# Patient Record
Sex: Female | Born: 1940 | Race: White | Hispanic: No | Marital: Married | Smoking: Never smoker
Health system: Southern US, Community
[De-identification: ages and names within clinical notes are randomized; demographics above are authoritative.]

## PROBLEM LIST (undated history)

## (undated) DIAGNOSIS — G2581 Restless legs syndrome: Secondary | ICD-10-CM

## (undated) DIAGNOSIS — R42 Dizziness and giddiness: Secondary | ICD-10-CM

## (undated) DIAGNOSIS — E785 Hyperlipidemia, unspecified: Secondary | ICD-10-CM

## (undated) DIAGNOSIS — G459 Transient cerebral ischemic attack, unspecified: Secondary | ICD-10-CM

## (undated) DIAGNOSIS — R9082 White matter disease, unspecified: Secondary | ICD-10-CM

## (undated) DIAGNOSIS — G959 Disease of spinal cord, unspecified: Secondary | ICD-10-CM

## (undated) DIAGNOSIS — E119 Type 2 diabetes mellitus without complications: Secondary | ICD-10-CM

## (undated) DIAGNOSIS — R519 Headache, unspecified: Secondary | ICD-10-CM

## (undated) DIAGNOSIS — I10 Essential (primary) hypertension: Secondary | ICD-10-CM

## (undated) DIAGNOSIS — M1990 Unspecified osteoarthritis, unspecified site: Secondary | ICD-10-CM

## (undated) DIAGNOSIS — I639 Cerebral infarction, unspecified: Secondary | ICD-10-CM

## (undated) DIAGNOSIS — R51 Headache: Secondary | ICD-10-CM

## (undated) DIAGNOSIS — F419 Anxiety disorder, unspecified: Secondary | ICD-10-CM

## (undated) HISTORY — PX: COLONOSCOPY W/ POLYPECTOMY: SHX1380

---

## 2000-06-24 DIAGNOSIS — I639 Cerebral infarction, unspecified: Secondary | ICD-10-CM

## 2000-06-24 HISTORY — DX: Cerebral infarction, unspecified: I63.9

## 2004-04-13 ENCOUNTER — Ambulatory Visit: Payer: Self-pay | Admitting: Internal Medicine

## 2005-07-30 ENCOUNTER — Ambulatory Visit: Payer: Self-pay | Admitting: Internal Medicine

## 2006-03-03 ENCOUNTER — Ambulatory Visit: Payer: Self-pay | Admitting: Gastroenterology

## 2006-09-04 ENCOUNTER — Ambulatory Visit: Payer: Self-pay | Admitting: Internal Medicine

## 2007-09-23 ENCOUNTER — Ambulatory Visit: Payer: Self-pay | Admitting: Internal Medicine

## 2008-11-30 ENCOUNTER — Ambulatory Visit: Payer: Self-pay | Admitting: Internal Medicine

## 2010-03-20 ENCOUNTER — Ambulatory Visit: Payer: Self-pay | Admitting: Internal Medicine

## 2010-04-05 ENCOUNTER — Ambulatory Visit: Payer: Self-pay | Admitting: Internal Medicine

## 2010-06-24 HISTORY — PX: BACK SURGERY: SHX140

## 2010-07-02 ENCOUNTER — Ambulatory Visit: Payer: Self-pay | Admitting: Gastroenterology

## 2011-05-20 ENCOUNTER — Ambulatory Visit: Payer: Self-pay | Admitting: Internal Medicine

## 2012-05-20 ENCOUNTER — Ambulatory Visit: Payer: Self-pay | Admitting: Internal Medicine

## 2012-11-17 ENCOUNTER — Emergency Department: Payer: Self-pay | Admitting: Internal Medicine

## 2012-11-20 ENCOUNTER — Ambulatory Visit: Payer: Self-pay | Admitting: Internal Medicine

## 2012-11-23 ENCOUNTER — Other Ambulatory Visit: Payer: Self-pay | Admitting: Neurosurgery

## 2012-11-24 ENCOUNTER — Encounter (HOSPITAL_COMMUNITY): Payer: Self-pay | Admitting: Pharmacy Technician

## 2012-11-25 ENCOUNTER — Encounter (HOSPITAL_COMMUNITY): Payer: Self-pay | Admitting: Surgery

## 2012-11-25 MED ORDER — CEFAZOLIN SODIUM-DEXTROSE 2-3 GM-% IV SOLR
2.0000 g | INTRAVENOUS | Status: AC
  Administered 2012-11-26: 2 g via INTRAVENOUS
  Filled 2012-11-25 (×2): qty 50

## 2012-11-25 MED ORDER — MUPIROCIN 2 % EX OINT
TOPICAL_OINTMENT | Freq: Once | CUTANEOUS | Status: DC
  Filled 2012-11-25 (×2): qty 22

## 2012-11-26 ENCOUNTER — Inpatient Hospital Stay (HOSPITAL_COMMUNITY): Payer: Medicare Other

## 2012-11-26 ENCOUNTER — Encounter (HOSPITAL_COMMUNITY): Payer: Self-pay | Admitting: *Deleted

## 2012-11-26 ENCOUNTER — Inpatient Hospital Stay (HOSPITAL_COMMUNITY): Payer: Medicare Other | Admitting: *Deleted

## 2012-11-26 ENCOUNTER — Encounter (HOSPITAL_COMMUNITY)
Admission: RE | Disposition: A | Discharge status: Home / Self Care | Payer: Self-pay | Source: Ambulatory Visit | Attending: Neurosurgery

## 2012-11-26 ENCOUNTER — Inpatient Hospital Stay (HOSPITAL_COMMUNITY)
Admission: RE | Admit: 2012-11-26 | Discharge: 2012-11-27 | DRG: 491 | Disposition: A | Discharge status: Home / Self Care | Payer: Medicare Other | Source: Ambulatory Visit | Attending: Neurosurgery | Admitting: Neurosurgery

## 2012-11-26 DIAGNOSIS — Z8673 Personal history of transient ischemic attack (TIA), and cerebral infarction without residual deficits: Secondary | ICD-10-CM

## 2012-11-26 DIAGNOSIS — E119 Type 2 diabetes mellitus without complications: Secondary | ICD-10-CM | POA: Diagnosis present

## 2012-11-26 DIAGNOSIS — I10 Essential (primary) hypertension: Secondary | ICD-10-CM | POA: Diagnosis present

## 2012-11-26 DIAGNOSIS — Z79899 Other long term (current) drug therapy: Secondary | ICD-10-CM

## 2012-11-26 DIAGNOSIS — Z7982 Long term (current) use of aspirin: Secondary | ICD-10-CM

## 2012-11-26 DIAGNOSIS — M5126 Other intervertebral disc displacement, lumbar region: Principal | ICD-10-CM | POA: Diagnosis present

## 2012-11-26 DIAGNOSIS — M47817 Spondylosis without myelopathy or radiculopathy, lumbosacral region: Secondary | ICD-10-CM | POA: Diagnosis present

## 2012-11-26 DIAGNOSIS — E785 Hyperlipidemia, unspecified: Secondary | ICD-10-CM | POA: Diagnosis present

## 2012-11-26 DIAGNOSIS — M51379 Other intervertebral disc degeneration, lumbosacral region without mention of lumbar back pain or lower extremity pain: Secondary | ICD-10-CM | POA: Diagnosis present

## 2012-11-26 DIAGNOSIS — M5137 Other intervertebral disc degeneration, lumbosacral region: Secondary | ICD-10-CM | POA: Diagnosis present

## 2012-11-26 HISTORY — PX: LUMBAR LAMINECTOMY/DECOMPRESSION MICRODISCECTOMY: SHX5026

## 2012-11-26 HISTORY — DX: Transient cerebral ischemic attack, unspecified: G45.9

## 2012-11-26 HISTORY — DX: Hyperlipidemia, unspecified: E78.5

## 2012-11-26 HISTORY — DX: Dizziness and giddiness: R42

## 2012-11-26 HISTORY — DX: Type 2 diabetes mellitus without complications: E11.9

## 2012-11-26 HISTORY — DX: Essential (primary) hypertension: I10

## 2012-11-26 LAB — CBC
MCH: 30.6 pg (ref 26.0–34.0)
MCV: 87.4 fL (ref 78.0–100.0)
Platelets: 226 10*3/uL (ref 150–400)
RDW: 13 % (ref 11.5–15.5)

## 2012-11-26 LAB — BASIC METABOLIC PANEL
BUN: 11 mg/dL (ref 6–23)
CO2: 27 mEq/L (ref 19–32)
Calcium: 9.5 mg/dL (ref 8.4–10.5)
Chloride: 102 mEq/L (ref 96–112)
Creatinine, Ser: 0.58 mg/dL (ref 0.50–1.10)
GFR calc Af Amer: >90 mL/min (ref >90)
GFR calc non Af Amer: 89 mL/min — ABNORMAL LOW (ref >90)
Glucose, Bld: 185 mg/dL — ABNORMAL HIGH (ref 70–99)
Potassium: 3.6 mEq/L (ref 3.5–5.1)
Sodium: 138 mEq/L (ref 135–145)

## 2012-11-26 LAB — GLUCOSE, CAPILLARY
Glucose-Capillary: 164 mg/dL — ABNORMAL HIGH (ref 70–99)
Glucose-Capillary: 139 mg/dL — ABNORMAL HIGH (ref 70–99)
Glucose-Capillary: 185 mg/dL — ABNORMAL HIGH (ref 70–99)
Glucose-Capillary: 329 mg/dL — ABNORMAL HIGH (ref 70–99)

## 2012-11-26 SURGERY — LUMBAR LAMINECTOMY/DECOMPRESSION MICRODISCECTOMY 1 LEVEL
Anesthesia: General | Site: Back | Laterality: Right | Wound class: Clean

## 2012-11-26 MED ORDER — KETOROLAC TROMETHAMINE 30 MG/ML IJ SOLN
INTRAMUSCULAR | Status: AC
  Filled 2012-11-26: qty 1

## 2012-11-26 MED ORDER — ONDANSETRON 8 MG/NS 50 ML IVPB
8.0000 mg | Freq: Four times a day (QID) | INTRAVENOUS | Status: DC | PRN
Start: 2012-11-26 — End: 2012-11-27
  Filled 2012-11-26: qty 8

## 2012-11-26 MED ORDER — LIDOCAINE-EPINEPHRINE 1 %-1:100000 IJ SOLN
INTRAMUSCULAR | Status: DC | PRN
  Administered 2012-11-26: 10 mL

## 2012-11-26 MED ORDER — HEMOSTATIC AGENTS (NO CHARGE) OPTIME
TOPICAL | Status: DC | PRN
  Administered 2012-11-26: 1 via TOPICAL

## 2012-11-26 MED ORDER — BUPIVACAINE HCL (PF) 0.5 % IJ SOLN
INTRAMUSCULAR | Status: DC | PRN
  Administered 2012-11-26: 10 mL

## 2012-11-26 MED ORDER — 0.9 % SODIUM CHLORIDE (POUR BTL) OPTIME
TOPICAL | Status: DC | PRN
  Administered 2012-11-26: 1000 mL

## 2012-11-26 MED ORDER — KETOROLAC TROMETHAMINE 30 MG/ML IJ SOLN
30.0000 mg | Freq: Four times a day (QID) | INTRAMUSCULAR | Status: DC
  Administered 2012-11-26 – 2012-11-27 (×3): 30 mg via INTRAVENOUS
  Filled 2012-11-26 (×7): qty 1

## 2012-11-26 MED ORDER — ACETAMINOPHEN 10 MG/ML IV SOLN
INTRAVENOUS | Status: AC
  Administered 2012-11-26: 1000 mg via INTRAVENOUS
  Filled 2012-11-26: qty 100

## 2012-11-26 MED ORDER — BACITRACIN 50000 UNITS IM SOLR
INTRAMUSCULAR | Status: AC
  Filled 2012-11-26: qty 1

## 2012-11-26 MED ORDER — SODIUM CHLORIDE 0.9 % IJ SOLN: 3.0000 mL | Freq: Two times a day (BID) | INTRAMUSCULAR | Status: DC

## 2012-11-26 MED ORDER — HYDROCHLOROTHIAZIDE 12.5 MG PO CAPS
12.5000 mg | ORAL_CAPSULE | Freq: Every day | ORAL | Status: DC
  Filled 2012-11-26 (×2): qty 1

## 2012-11-26 MED ORDER — KETOROLAC TROMETHAMINE 30 MG/ML IJ SOLN
30.0000 mg | Freq: Once | INTRAMUSCULAR | Status: AC
  Administered 2012-11-26: 30 mg via INTRAVENOUS

## 2012-11-26 MED ORDER — EPHEDRINE SULFATE 50 MG/ML IJ SOLN
INTRAMUSCULAR | Status: DC | PRN
  Administered 2012-11-26: 10 mg via INTRAVENOUS

## 2012-11-26 MED ORDER — METHYLPREDNISOLONE ACETATE 80 MG/ML IJ SUSP
INTRAMUSCULAR | Status: DC | PRN
  Administered 2012-11-26: 80 mg

## 2012-11-26 MED ORDER — HYDROXYZINE HCL 50 MG PO TABS
50.0000 mg | ORAL_TABLET | ORAL | Status: DC | PRN
  Filled 2012-11-26: qty 1

## 2012-11-26 MED ORDER — SODIUM CHLORIDE 0.9 % IJ SOLN: 3.0000 mL | INTRAMUSCULAR | Status: DC | PRN

## 2012-11-26 MED ORDER — PROPOFOL 10 MG/ML IV BOLUS
INTRAVENOUS | Status: DC | PRN
  Administered 2012-11-26: 200 mg via INTRAVENOUS

## 2012-11-26 MED ORDER — GLIMEPIRIDE 4 MG PO TABS
4.0000 mg | ORAL_TABLET | Freq: Two times a day (BID) | ORAL | Status: DC
  Administered 2012-11-26 – 2012-11-27 (×2): 4 mg via ORAL
  Filled 2012-11-26 (×4): qty 1

## 2012-11-26 MED ORDER — ACETAMINOPHEN 650 MG RE SUPP: 650.0000 mg | RECTAL | Status: DC | PRN

## 2012-11-26 MED ORDER — HYDROMORPHONE HCL PF 1 MG/ML IJ SOLN
INTRAMUSCULAR | Status: AC
  Filled 2012-11-26: qty 1

## 2012-11-26 MED ORDER — THROMBIN 5000 UNITS EX SOLR
CUTANEOUS | Status: DC | PRN
  Administered 2012-11-26 (×2): 5000 [IU] via TOPICAL

## 2012-11-26 MED ORDER — OXYCODONE HCL 5 MG PO TABS
5.0000 mg | ORAL_TABLET | Freq: Once | ORAL | Status: AC | PRN
  Administered 2012-11-26: 5 mg via ORAL

## 2012-11-26 MED ORDER — PHENYLEPHRINE HCL 10 MG/ML IJ SOLN
INTRAMUSCULAR | Status: DC | PRN
  Administered 2012-11-26: 80 ug via INTRAVENOUS
  Administered 2012-11-26: 40 ug via INTRAVENOUS
  Administered 2012-11-26 (×3): 80 ug via INTRAVENOUS
  Administered 2012-11-26: 40 ug via INTRAVENOUS

## 2012-11-26 MED ORDER — LACTATED RINGERS IV SOLN
INTRAVENOUS | Status: DC | PRN
  Administered 2012-11-26 (×2): via INTRAVENOUS

## 2012-11-26 MED ORDER — ARTIFICIAL TEARS OP OINT
TOPICAL_OINTMENT | OPHTHALMIC | Status: DC | PRN
  Administered 2012-11-26: 1 via OPHTHALMIC

## 2012-11-26 MED ORDER — MIDAZOLAM HCL 5 MG/5ML IJ SOLN
INTRAMUSCULAR | Status: DC | PRN
  Administered 2012-11-26: 2 mg via INTRAVENOUS

## 2012-11-26 MED ORDER — LIDOCAINE HCL (CARDIAC) 20 MG/ML IV SOLN
INTRAVENOUS | Status: DC | PRN
  Administered 2012-11-26: 50 mg via INTRAVENOUS

## 2012-11-26 MED ORDER — SODIUM CHLORIDE 0.9 % IV SOLN
INTRAVENOUS | Status: AC
  Filled 2012-11-26: qty 500

## 2012-11-26 MED ORDER — MENTHOL 3 MG MT LOZG: 1.0000 | LOZENGE | OROMUCOSAL | Status: DC | PRN

## 2012-11-26 MED ORDER — ACETAMINOPHEN 10 MG/ML IV SOLN
1000.0000 mg | Freq: Four times a day (QID) | INTRAVENOUS | Status: DC
  Administered 2012-11-26 – 2012-11-27 (×3): 1000 mg via INTRAVENOUS
  Filled 2012-11-26 (×4): qty 100

## 2012-11-26 MED ORDER — NEOSTIGMINE METHYLSULFATE 1 MG/ML IJ SOLN
INTRAMUSCULAR | Status: DC | PRN
  Administered 2012-11-26: 1 mg via INTRAVENOUS
  Administered 2012-11-26: 2 mg via INTRAVENOUS

## 2012-11-26 MED ORDER — AMLODIPINE BESYLATE 10 MG PO TABS
10.0000 mg | ORAL_TABLET | Freq: Every day | ORAL | Status: DC
  Filled 2012-11-26 (×2): qty 1

## 2012-11-26 MED ORDER — CYCLOBENZAPRINE HCL 10 MG PO TABS: 10.0000 mg | ORAL_TABLET | Freq: Three times a day (TID) | ORAL | Status: DC | PRN

## 2012-11-26 MED ORDER — MUPIROCIN 2 % EX OINT
TOPICAL_OINTMENT | Freq: Two times a day (BID) | CUTANEOUS | Status: DC
  Administered 2012-11-26: 1 via NASAL

## 2012-11-26 MED ORDER — FENTANYL CITRATE 0.05 MG/ML IJ SOLN
INTRAMUSCULAR | Status: AC
  Filled 2012-11-26: qty 2

## 2012-11-26 MED ORDER — OXYCODONE HCL 5 MG PO TABS
ORAL_TABLET | ORAL | Status: AC
  Filled 2012-11-26: qty 1

## 2012-11-26 MED ORDER — ROCURONIUM BROMIDE 100 MG/10ML IV SOLN
INTRAVENOUS | Status: DC | PRN
  Administered 2012-11-26: 50 mg via INTRAVENOUS

## 2012-11-26 MED ORDER — OXYCODONE HCL 5 MG/5ML PO SOLN: 5.0000 mg | Freq: Once | ORAL | Status: AC | PRN

## 2012-11-26 MED ORDER — BISACODYL 10 MG RE SUPP: 10.0000 mg | Freq: Every day | RECTAL | Status: DC | PRN

## 2012-11-26 MED ORDER — ONDANSETRON HCL 4 MG/2ML IJ SOLN
INTRAMUSCULAR | Status: DC | PRN
  Administered 2012-11-26: 4 mg via INTRAVENOUS

## 2012-11-26 MED ORDER — FENTANYL CITRATE 0.05 MG/ML IJ SOLN
INTRAMUSCULAR | Status: DC | PRN
  Administered 2012-11-26: 50 ug via INTRAVENOUS
  Administered 2012-11-26: 100 ug via INTRAVENOUS
  Administered 2012-11-26 (×2): 50 ug via INTRAVENOUS

## 2012-11-26 MED ORDER — PHENOL 1.4 % MT LIQD: 1.0000 | OROMUCOSAL | Status: DC | PRN

## 2012-11-26 MED ORDER — GLYCOPYRROLATE 0.2 MG/ML IJ SOLN
INTRAMUSCULAR | Status: DC | PRN
  Administered 2012-11-26 (×2): 0.2 mg via INTRAVENOUS

## 2012-11-26 MED ORDER — ATORVASTATIN CALCIUM 80 MG PO TABS
80.0000 mg | ORAL_TABLET | Freq: Every day | ORAL | Status: DC
  Administered 2012-11-26: 80 mg via ORAL
  Filled 2012-11-26 (×2): qty 1

## 2012-11-26 MED ORDER — POTASSIUM CHLORIDE IN NACL 20-0.9 MEQ/L-% IV SOLN
INTRAVENOUS | Status: DC
  Administered 2012-11-26: 19:00:00 via INTRAVENOUS
  Filled 2012-11-26 (×4): qty 1000

## 2012-11-26 MED ORDER — LISINOPRIL 40 MG PO TABS
80.0000 mg | ORAL_TABLET | Freq: Every day | ORAL | Status: DC
  Filled 2012-11-26 (×2): qty 2

## 2012-11-26 MED ORDER — HYDROMORPHONE HCL PF 1 MG/ML IJ SOLN
0.2500 mg | INTRAMUSCULAR | Status: DC | PRN
Start: 2012-11-26 — End: 2012-11-26
  Administered 2012-11-26 (×2): 0.5 mg via INTRAVENOUS

## 2012-11-26 MED ORDER — MUPIROCIN 2 % EX OINT
TOPICAL_OINTMENT | Freq: Two times a day (BID) | CUTANEOUS | Status: DC
  Administered 2012-11-26: 22:00:00 via NASAL

## 2012-11-26 MED ORDER — ONDANSETRON HCL 4 MG/2ML IJ SOLN
4.0000 mg | Freq: Four times a day (QID) | INTRAMUSCULAR | Status: DC | PRN
Start: 2012-11-26 — End: 2012-11-26

## 2012-11-26 MED ORDER — ONDANSETRON HCL 4 MG/2ML IJ SOLN
4.0000 mg | Freq: Four times a day (QID) | INTRAMUSCULAR | Status: DC | PRN
Start: 2012-11-26 — End: 2012-11-27

## 2012-11-26 MED ORDER — SODIUM CHLORIDE 0.9 % IR SOLN
Status: DC | PRN
  Administered 2012-11-26: 15:00:00

## 2012-11-26 MED ORDER — MAGNESIUM HYDROXIDE 400 MG/5ML PO SUSP: 30.0000 mL | Freq: Every day | ORAL | Status: DC | PRN

## 2012-11-26 MED ORDER — OXYCODONE HCL 5 MG PO TABS: 5.0000 mg | ORAL_TABLET | ORAL | Status: DC | PRN

## 2012-11-26 MED ORDER — FENTANYL CITRATE 0.05 MG/ML IJ SOLN
INTRAMUSCULAR | Status: DC | PRN
  Administered 2012-11-26: 100 ug via INTRAVENOUS

## 2012-11-26 MED ORDER — ZOLPIDEM TARTRATE 5 MG PO TABS
5.0000 mg | ORAL_TABLET | Freq: Every evening | ORAL | Status: DC | PRN
  Administered 2012-11-27: 5 mg via ORAL
  Filled 2012-11-26: qty 1

## 2012-11-26 MED ORDER — METOCLOPRAMIDE HCL 5 MG/ML IJ SOLN: 10.0000 mg | Freq: Once | INTRAMUSCULAR | Status: DC | PRN

## 2012-11-26 MED ORDER — MORPHINE SULFATE 4 MG/ML IJ SOLN: 4.0000 mg | INTRAMUSCULAR | Status: DC | PRN

## 2012-11-26 MED ORDER — ACETAMINOPHEN 325 MG PO TABS: 650.0000 mg | ORAL_TABLET | ORAL | Status: DC | PRN

## 2012-11-26 MED ORDER — ALUM & MAG HYDROXIDE-SIMETH 200-200-20 MG/5ML PO SUSP: 30.0000 mL | Freq: Four times a day (QID) | ORAL | Status: DC | PRN

## 2012-11-26 SURGICAL SUPPLY — 56 items
BAG DECANTER FOR FLEXI CONT (MISCELLANEOUS) ×2 IMPLANT
BENZOIN TINCTURE PRP APPL 2/3 (GAUZE/BANDAGES/DRESSINGS) IMPLANT
BLADE SURG ROTATE 9660 (MISCELLANEOUS) IMPLANT
BRUSH SCRUB EZ PLAIN DRY (MISCELLANEOUS) ×2 IMPLANT
BUR ACORN 6.0 ACORN (BURR) ×2 IMPLANT
BUR ACRON 5.0MM COATED (BURR) IMPLANT
BUR MATCHSTICK NEURO 3.0 LAGG (BURR) ×2 IMPLANT
CANISTER SUCTION 2500CC (MISCELLANEOUS) ×2 IMPLANT
CLOTH BEACON ORANGE TIMEOUT ST (SAFETY) ×2 IMPLANT
CONT SPEC 4OZ CLIKSEAL STRL BL (MISCELLANEOUS) ×2 IMPLANT
DERMABOND ADHESIVE PROPEN (GAUZE/BANDAGES/DRESSINGS) ×1
DERMABOND ADVANCED (GAUZE/BANDAGES/DRESSINGS)
DERMABOND ADVANCED .7 DNX12 (GAUZE/BANDAGES/DRESSINGS) IMPLANT
DERMABOND ADVANCED .7 DNX6 (GAUZE/BANDAGES/DRESSINGS) ×1 IMPLANT
DRAPE LAPAROTOMY 100X72X124 (DRAPES) ×2 IMPLANT
DRAPE MICROSCOPE LEICA (MISCELLANEOUS) IMPLANT
DRAPE MICROSCOPE ZEISS OPMI (DRAPES) ×2 IMPLANT
DRAPE POUCH INSTRU U-SHP 10X18 (DRAPES) ×2 IMPLANT
DRSG EMULSION OIL 3X3 NADH (GAUZE/BANDAGES/DRESSINGS) IMPLANT
ELECT REM PT RETURN 9FT ADLT (ELECTROSURGICAL) ×2
ELECTRODE REM PT RTRN 9FT ADLT (ELECTROSURGICAL) ×1 IMPLANT
GAUZE SPONGE 4X4 16PLY XRAY LF (GAUZE/BANDAGES/DRESSINGS) IMPLANT
GLOVE BIOGEL PI IND STRL 8 (GLOVE) ×2 IMPLANT
GLOVE BIOGEL PI INDICATOR 8 (GLOVE) ×2
GLOVE ECLIPSE 7.5 STRL STRAW (GLOVE) ×8 IMPLANT
GLOVE EXAM NITRILE LRG STRL (GLOVE) IMPLANT
GLOVE EXAM NITRILE MD LF STRL (GLOVE) ×2 IMPLANT
GLOVE EXAM NITRILE XL STR (GLOVE) IMPLANT
GLOVE EXAM NITRILE XS STR PU (GLOVE) IMPLANT
GOWN BRE IMP SLV AUR LG STRL (GOWN DISPOSABLE) IMPLANT
GOWN BRE IMP SLV AUR XL STRL (GOWN DISPOSABLE) ×2 IMPLANT
GOWN STRL REIN 2XL LVL4 (GOWN DISPOSABLE) ×2 IMPLANT
KIT BASIN OR (CUSTOM PROCEDURE TRAY) ×2 IMPLANT
KIT ROOM TURNOVER OR (KITS) ×2 IMPLANT
NEEDLE HYPO 18GX1.5 BLUNT FILL (NEEDLE) IMPLANT
NEEDLE SPNL 18GX3.5 QUINCKE PK (NEEDLE) ×2 IMPLANT
NEEDLE SPNL 22GX3.5 QUINCKE BK (NEEDLE) ×2 IMPLANT
NS IRRIG 1000ML POUR BTL (IV SOLUTION) ×2 IMPLANT
PACK LAMINECTOMY NEURO (CUSTOM PROCEDURE TRAY) ×2 IMPLANT
PAD ARMBOARD 7.5X6 YLW CONV (MISCELLANEOUS) ×6 IMPLANT
PATTIES SURGICAL .5 X1 (DISPOSABLE) IMPLANT
RUBBERBAND STERILE (MISCELLANEOUS) ×4 IMPLANT
SPONGE GAUZE 4X4 12PLY (GAUZE/BANDAGES/DRESSINGS) IMPLANT
SPONGE LAP 4X18 X RAY DECT (DISPOSABLE) IMPLANT
SPONGE SURGIFOAM ABS GEL SZ50 (HEMOSTASIS) ×2 IMPLANT
STRIP CLOSURE SKIN 1/2X4 (GAUZE/BANDAGES/DRESSINGS) IMPLANT
SUT PROLENE 6 0 BV (SUTURE) IMPLANT
SUT VIC AB 1 CT1 18XBRD ANBCTR (SUTURE) ×1 IMPLANT
SUT VIC AB 1 CT1 8-18 (SUTURE) ×1
SUT VIC AB 2-0 CP2 18 (SUTURE) ×2 IMPLANT
SUT VIC AB 3-0 SH 8-18 (SUTURE) ×2 IMPLANT
SYR 20ML ECCENTRIC (SYRINGE) ×2 IMPLANT
SYR 5ML LL (SYRINGE) IMPLANT
TOWEL OR 17X24 6PK STRL BLUE (TOWEL DISPOSABLE) ×2 IMPLANT
TOWEL OR 17X26 10 PK STRL BLUE (TOWEL DISPOSABLE) ×2 IMPLANT
WATER STERILE IRR 1000ML POUR (IV SOLUTION) ×2 IMPLANT

## 2012-11-26 NOTE — Anesthesia Postprocedure Evaluation (Signed)
Anesthesia Post Note  Patient: Cheryl Mueller  Procedure(s) Performed: Procedure(s) (LRB): LUMBAR LAMINECTOMY/DECOMPRESSION MICRODISCECTOMY 1 LEVEL (Right)  Anesthesia type: General  Patient location: PACU  Post pain: Pain level controlled  Post assessment: Patient's Cardiovascular Status Stable  Last Vitals:  Filed Vitals:   11/26/12 1644  BP: 138/65  Pulse: 64  Temp: 36.4 C  Resp: 18    Post vital signs: Reviewed and stable  Level of consciousness: alert  Complications: No apparent anesthesia complications

## 2012-11-26 NOTE — Plan of Care (Signed)
Problem: Consults Goal: Diagnosis - Spinal Surgery Outcome: Completed/Met Date Met:  11/26/12 Microdiscectomy

## 2012-11-26 NOTE — Anesthesia Preprocedure Evaluation (Signed)
Anesthesia Evaluation  Patient identified by MRN, date of birth, ID band Patient awake    Reviewed: Allergy & Precautions, H&P , NPO status , Patient's Chart, lab work & pertinent test results, reviewed documented beta blocker date and time   Airway Mallampati: II TM Distance: >3 FB Neck ROM: full    Dental   Pulmonary neg pulmonary ROS,  breath sounds clear to auscultation        Cardiovascular hypertension, Pt. on medications Rhythm:regular     Neuro/Psych TIAnegative neurological ROS  negative psych ROS   GI/Hepatic negative GI ROS, Neg liver ROS,   Endo/Other  diabetes, Oral Hypoglycemic Agents  Renal/GU negative Renal ROS  negative genitourinary   Musculoskeletal   Abdominal   Peds  Hematology negative hematology ROS (+)   Anesthesia Other Findings See surgeon's H&P   Reproductive/Obstetrics negative OB ROS                           Anesthesia Physical Anesthesia Plan  ASA: III  Anesthesia Plan: General   Post-op Pain Management:    Induction: Intravenous  Airway Management Planned: Oral ETT  Additional Equipment:   Intra-op Plan:   Post-operative Plan: Extubation in OR  Informed Consent: I have reviewed the patients History and Physical, chart, labs and discussed the procedure including the risks, benefits and alternatives for the proposed anesthesia with the patient or authorized representative who has indicated his/her understanding and acceptance.   Dental Advisory Given  Plan Discussed with: CRNA and Surgeon  Anesthesia Plan Comments:         Anesthesia Quick Evaluation

## 2012-11-26 NOTE — Anesthesia Procedure Notes (Signed)
Procedure Name: Intubation Date/Time: 11/26/2012 1:39 PM Performed by: Everlene Balls TODD Pre-anesthesia Checklist: Patient identified, Emergency Drugs available, Suction available, Patient being monitored and Timeout performed Patient Re-evaluated:Patient Re-evaluated prior to inductionOxygen Delivery Method: Circle system utilized Preoxygenation: Pre-oxygenation with 100% oxygen Intubation Type: IV induction Ventilation: Mask ventilation without difficulty Laryngoscope Size: Mac and 3 Grade View: Grade II Tube size: 7.5 mm Number of attempts: 1 Airway Equipment and Method: Stylet Placement Confirmation: ETT inserted through vocal cords under direct vision,  positive ETCO2 and breath sounds checked- equal and bilateral Secured at: 22 cm Tube secured with: Tape Dental Injury: Teeth and Oropharynx as per pre-operative assessment

## 2012-11-26 NOTE — Preoperative (Signed)
Beta Blockers   Reason not to administer Beta Blockers:Not Applicable 

## 2012-11-26 NOTE — Op Note (Signed)
11/26/2012  2:53 PM  PATIENT:  Cheryl Mueller  72 y.o. female  PRE-OPERATIVE DIAGNOSIS: Right L5-S1 lumbar disc herniation, lumbar degenerative disc disease, lumbar spondylosis, lumbar radiculopathy  POST-OPERATIVE DIAGNOSIS:  Right L5-S1 lumbar disc herniation, lumbar degenerative disc disease, lumbar spondylosis, lumbar radiculopathy  PROCEDURE:  Procedure(s): LUMBAR LAMINECTOMY/DECOMPRESSION MICRODISCECTOMY 1 LEVEL: Right L5-S1 lumbar laminotomy and microdiscectomy, with microdissection, microsurgical technique, and the operating microscope.  SURGEON:  Surgeon(s): Hewitt Shorts, MD  ASSISTANTS: Clydene Fake, M.D.  ANESTHESIA:   general  EBL:  Total I/O In: 1000 [I.V.:1000] Out: -   BLOOD ADMINISTERED:none  COUNT: correct per nursing staff  DICTATION: Patient was brought to the operating room and placed under general endotracheal anesthesia. Patient was turned to prone position the lumbar region was prepped with Betadine soap and solution and draped in a sterile fashion. The midline was infiltrated with local anesthetic with epinephrine. A localizing x-ray was taken and the L5-S1 level was identified. Midline incision was made over the L5-S1 level and was carried down through the subcutaneous tissue to the lumbar fascia. The lumbar fascia was incised on the right side and the paraspinal muscles were dissected from the spinous processes and lamina in a subperiosteal fashion. Another x-ray was taken and the L5-S1 intralaminar space was identified. The operating microscope was draped and brought into the field provided additional magnification, illumination, and visualization. Laminotomy was performed using the high-speed drill and Kerrison punches. The ligamentum flavum was carefully resected. The underlying thecal sac and nerve root were identified. The disc herniation was identified and the thecal sac and nerve root gently retracted medially. The disc herniation with a free  fragment that had migrated caudally behind the body of S1. He was removed in piecemeal fashion using the micropituitary rongeur. All loose fragments of disc until removed from within the disc space, and good decompression the thecal sac and nerve root was achieved. It is not felt that entering the disc space with enhanced decompression. Once the discectomy was completed and good decompression of the thecal sac and nerve had been achieved hemostasis was established with the use of bipolar cautery and Gelfoam with thrombin. The Gelfoam was removed and hemostasis confirmed. We then instilled 2 cc of fentanyl and 80 mg of Depo-Medrol into the epidural space. Deep fascia was closed with interrupted undyed 1 Vicryl sutures. Scarpa's fascia was closed with interrupted undyed 1 Vicryl sutures in the subcutaneous and subcuticular layer were closed with interrupted inverted 2-0 undyed Vicryl sutures. The skin edges were approximated with Dermabond. Following surgery the patient was turned back to a supine position to be reversed from the anesthetic extubated and transferred to the recovery room for further care.   PLAN OF CARE: Admit to inpatient   PATIENT DISPOSITION:  PACU - hemodynamically stable.   Delay start of Pharmacological VTE agent (>24hrs) due to surgical blood loss or risk of bleeding:  yes

## 2012-11-26 NOTE — Progress Notes (Signed)
Filed Vitals:   11/26/12 1551 11/26/12 1609 11/26/12 1618 11/26/12 1644  BP: 121/59 100/84  138/65  Pulse: 70 72  64  Temp:   98 F (36.7 C) 97.6 F (36.4 C)  TempSrc:      Resp: 12 16  18   Height:      Weight:      SpO2: 98% 99%  94%    CBC  Recent Labs  11/26/12 1047  WBC 8.1  HGB 13.3  HCT 38.0  PLT 226   BMET  Recent Labs  11/26/12 1047  NA 138  K 3.6  CL 102  CO2 27  GLUCOSE 185*  BUN 11  CREATININE 0.58  CALCIUM 9.5    Excellent relief of radicular pain. No significant incisional discomfort. Has been up and out of bed to the bathroom, and has voided. Wound clean and dry.Moving all 4 extremities well.  Plan: Encouraged to ambulate in the halls. Continued to progress to postoperative recovery.   Hewitt Shorts, MD 11/26/2012, 7:56 PM

## 2012-11-26 NOTE — Transfer of Care (Signed)
Immediate Anesthesia Transfer of Care Note  Patient: Cheryl Mueller  Procedure(s) Performed: Procedure(s) with comments: LUMBAR LAMINECTOMY/DECOMPRESSION MICRODISCECTOMY 1 LEVEL (Right) - Lumbar five-sacral one laminotomy and microdiskectomy   Patient Location: PACU  Anesthesia Type:General  Level of Consciousness: patient cooperative, lethargic and responds to stimulation  Airway & Oxygen Therapy: Patient Spontanous Breathing and Patient connected to nasal cannula oxygen  Post-op Assessment: Report given to PACU RN and Post -op Vital signs reviewed and stable  Post vital signs: Reviewed and stable  Complications: No apparent anesthesia complications

## 2012-11-26 NOTE — H&P (Signed)
Subjective:  Patient is a 72 y.o. female who is admitted for treatment of right lumbar radiculopathy secondary to right L5-S1 lumbar disc herniation. Patient's been in disabling pain to the right lower extremity. MRI scan reveals a right L5-S1 lumbar disc the fragment has migrated caudally behind the body of S1. Patient admitted now for a right L5-S1 lumbar laminotomy and discectomy.    Past Medical History  Diagnosis Date  . Hypertension   . Diabetes mellitus without complication   . TIA (transient ischemic attack)     approx 15 years ago  . Vertigo     hx of  . Hyperlipemia     Past Surgical History  Procedure Laterality Date  . Colonoscopy w/ polypectomy      Prescriptions prior to admission  Medication Sig Dispense Refill  . amLODipine (NORVASC) 10 MG tablet Take 10 mg by mouth daily.      Marland Kitchen aspirin EC 81 MG tablet Take 81 mg by mouth daily.      Marland Kitchen CALCIUM PO Take 1 tablet by mouth 2 (two) times daily.      Marland Kitchen glimepiride (AMARYL) 4 MG tablet Take 4 mg by mouth 2 (two) times daily.      . hydrochlorothiazide (MICROZIDE) 12.5 MG capsule Take 12.5 mg by mouth daily.      Marland Kitchen HYDROcodone-acetaminophen (NORCO/VICODIN) 5-325 MG per tablet Take 1 tablet by mouth every 8 (eight) hours as needed for pain.      Marland Kitchen lisinopril (PRINIVIL,ZESTRIL) 40 MG tablet Take 80 mg by mouth at bedtime.      . rosuvastatin (CRESTOR) 40 MG tablet Take 40 mg by mouth daily.       No Known Allergies  History  Substance Use Topics  . Smoking status: Never Smoker   . Smokeless tobacco: Not on file  . Alcohol Use: No    History reviewed. No pertinent family history.   Review of Systems A comprehensive review of systems was negative.  Objective: Vital signs in last 24 hours: Temp:  [98.5 F (36.9 C)] 98.5 F (36.9 C) (06/05 1043) Pulse Rate:  [74] 74 (06/05 1043) Resp:  [18] 18 (06/05 1043) BP: (148)/(67) 148/67 mmHg (06/05 1043) SpO2:  [98 %] 98 % (06/05 1043) Weight:  [67.841 kg (149 lb 9 oz)]  67.841 kg (149 lb 9 oz) (06/05 1043)  EXAM: Patient well-developed well-nourished white female in no acute distress. Lungs are clear to auscultation , the patient has symmetrical respiratory excursion. Heart has a regular rate and rhythm normal S1 and S2 no murmur.   Abdomen is soft nontender nondistended bowel sounds are present. Extremity examination shows no clubbing cyanosis or edema. Musculoskeletal examination shows a negative admit the left, but positive straight leg raising on the right at 70. Motor examination shows 5 over 5 strength in the lower extremities including the iliopsoas quadriceps dorsiflexor extensor hallicus  longus and plantar flexor bilaterally. Sensation is intact to pinprick in the distal lower extremities. Reflexes are symmetrical bilaterally. No pathologic reflexes are present. Patient has a normal gait and stance.   Data Review:CBC    Component Value Date/Time   WBC 8.1 11/26/2012 1047   RBC 4.35 11/26/2012 1047   HGB 13.3 11/26/2012 1047   HCT 38.0 11/26/2012 1047   PLT 226 11/26/2012 1047   MCV 87.4 11/26/2012 1047   MCH 30.6 11/26/2012 1047   MCHC 35.0 11/26/2012 1047   RDW 13.0 11/26/2012 1047  BMET    Component Value Date/Time   NA 138 11/26/2012 1047   K 3.6 11/26/2012 1047   CL 102 11/26/2012 1047   CO2 27 11/26/2012 1047   GLUCOSE 185* 11/26/2012 1047   BUN 11 11/26/2012 1047   CREATININE 0.58 11/26/2012 1047   CALCIUM 9.5 11/26/2012 1047   GFRNONAA 89* 11/26/2012 1047   GFRAA >90 11/26/2012 1047     Assessment/Plan: Patient with right lumbar radicular pain secondary to a right L5-S1 lumbar dysfunction, admitted for a right L5-S1 lumbar discectomy.  I've discussed with the patient the nature of his condition, the nature the surgical procedure, the typical length of surgery, hospital stay, and overall recuperation. We discussed limitations postoperatively. I discussed risks of surgery including risks of infection, bleeding, possibly need for transfusion,  the risk of nerve root dysfunction with pain, weakness, numbness, or paresthesias, or risk of dural tear and CSF leakage and possible need for further surgery, the risk of recurrent disc herniation and the possible need for further surgery, and the risk of anesthetic complications including myocardial infarction, stroke, pneumonia, and death. Understanding all this the patient does wish to proceed with surgery and is admitted for such.    Hewitt Shorts, MD 11/26/2012 1:04 PM

## 2012-11-27 ENCOUNTER — Encounter (HOSPITAL_COMMUNITY): Payer: Self-pay | Admitting: Neurosurgery

## 2012-11-27 LAB — GLUCOSE, CAPILLARY: Glucose-Capillary: 225 mg/dL — ABNORMAL HIGH (ref 70–99)

## 2012-11-27 NOTE — Progress Notes (Signed)
Utilization review completed. Shontelle Muska, RN, BSN. 

## 2012-11-27 NOTE — Discharge Summary (Signed)
Physician Discharge Summary  Patient ID: Cheryl Mueller MRN: [id number] DOB/AGE: 12/30/1940 72 y.o.  Admit date: 11/26/2012 Discharge date: 11/27/2012  Admission Diagnoses:  Right L5-S1 lumbar disc herniation, lumbar degenerative disc disease, lumbar spondylosis, lumbar radiculopathy  Discharge Diagnoses:  Right L5-S1 lumbar disc herniation, lumbar degenerative disc disease, lumbar spondylosis, lumbar radiculopathy  Discharged Condition: good  Hospital Course: Patient was admitted, underwent a right L5-S1 lumbar laminotomy and microdiscectomy. Possibly she is an excellent relief of her right lumbar radicular pain. She is up and living actively in the halls. She is asked to be discharged to home. We've given instructions regarding wound care and activities. She is to return for followup with me in 3 weeks.  Discharge Exam: Blood pressure 165/65, pulse 81, temperature 98.9 F (37.2 C), temperature source Oral, resp. rate 18, height 4' 11.5" (1.511 m), weight 67.841 kg (149 lb 9 oz), SpO2 95.00%.  Disposition:  home      Medication List    TAKE these medications       amLODipine 10 MG tablet  Commonly known as:  NORVASC  Take 10 mg by mouth daily.     aspirin EC 81 MG tablet  Take 81 mg by mouth daily.     CALCIUM PO  Take 1 tablet by mouth 2 (two) times daily.     glimepiride 4 MG tablet  Commonly known as:  AMARYL  Take 4 mg by mouth 2 (two) times daily.     hydrochlorothiazide 12.5 MG capsule  Commonly known as:  MICROZIDE  Take 12.5 mg by mouth daily.     HYDROcodone-acetaminophen 5-325 MG per tablet  Commonly known as:  NORCO/VICODIN  Take 1 tablet by mouth every 8 (eight) hours as needed for pain.     lisinopril 40 MG tablet  Commonly known as:  PRINIVIL,ZESTRIL  Take 80 mg by mouth at bedtime.     rosuvastatin 40 MG tablet  Commonly known as:  CRESTOR  Take 40 mg by mouth daily.         Signed: Hewitt Shorts, MD 11/27/2012, 7:30 AM  At 36 on a  prescription

## 2012-11-27 NOTE — Progress Notes (Signed)
Pt. discharged home accompanied by husband. Prescriptions and discharge instructions given with verbalization of understanding. Incision site on back with no s/s of infection - no swelling, redness, bleeding, and/or drainage noted. Soft collar intact. Opportunity given to ask questions but no question asked. Pt. transported out of this unit in wheelchair by the volunteer.

## 2013-05-24 ENCOUNTER — Ambulatory Visit: Payer: Self-pay | Admitting: Internal Medicine

## 2013-12-14 DIAGNOSIS — E1129 Type 2 diabetes mellitus with other diabetic kidney complication: Secondary | ICD-10-CM | POA: Insufficient documentation

## 2014-06-20 DIAGNOSIS — R809 Proteinuria, unspecified: Secondary | ICD-10-CM | POA: Insufficient documentation

## 2014-07-08 ENCOUNTER — Ambulatory Visit: Payer: Self-pay | Admitting: Internal Medicine

## 2014-08-11 DIAGNOSIS — N8111 Cystocele, midline: Secondary | ICD-10-CM | POA: Insufficient documentation

## 2014-09-15 ENCOUNTER — Encounter: Payer: Self-pay | Admitting: General Surgery

## 2014-09-15 ENCOUNTER — Ambulatory Visit (INDEPENDENT_AMBULATORY_CARE_PROVIDER_SITE_OTHER): Payer: Medicare Other | Admitting: General Surgery

## 2014-09-15 VITALS — BP 130/70 | HR 88 | Temp 98.2°F | Resp 14 | Ht 59.0 in | Wt 152.0 lb

## 2014-09-15 DIAGNOSIS — L02213 Cutaneous abscess of chest wall: Secondary | ICD-10-CM | POA: Diagnosis not present

## 2014-09-15 NOTE — Patient Instructions (Addendum)
Remove bandage and shower tomorrow then place a gauze dressing over area. Return in one week.

## 2014-09-15 NOTE — Progress Notes (Signed)
Patient ID: Cheryl Mueller, female   DOB: 04-26-1941, 74 y.o.   MRN: [id number]  Chief Complaint  Patient presents with  . Other    chest wall abscess    HPI Cheryl Mueller is a 74 y.o. female.  She comes in for evaluation of a painful knot under her left breast. She noticed it about 4-5 days ago. It has gotten worse, hard and red. Minimal drainage.     She states that she had one similar to that on her left buttock that drained about 2-3 weeks ago. She used heat to the area and it seemed to get better but there is still knot.  Dr Graciela HusbandsKlein called in her an antibiotic but she has not started it as of yet.   HPI  Past Medical History  Diagnosis Date  . Hypertension   . Diabetes mellitus without complication   . TIA (transient ischemic attack)     approx 15 years ago  . Vertigo     hx of  . Hyperlipemia     Past Surgical History  Procedure Laterality Date  . Colonoscopy w/ polypectomy    . Lumbar laminectomy/decompression microdiscectomy Right 11/26/2012    Procedure: LUMBAR LAMINECTOMY/DECOMPRESSION MICRODISCECTOMY 1 LEVEL;  Surgeon: Hewitt Shortsobert W Nudelman, MD;  Location: [facility];  Service: Neurosurgery;  Laterality: Right;  Lumbar five-sacral one laminotomy and microdiskectomy     Family History  Problem Relation Age of Onset  . Stroke Mother     Social History History  Substance Use Topics  . Smoking status: Never Smoker   . Smokeless tobacco: Never Used  . Alcohol Use: No    Allergies  Allergen Reactions  . Atorvastatin Rash    Current Outpatient Prescriptions  Medication Sig Dispense Refill  . amLODipine (NORVASC) 10 MG tablet Take 10 mg by mouth daily.    Marland Kitchen. aspirin EC 81 MG tablet Take 81 mg by mouth daily.    Marland Kitchen. CALCIUM PO Take 1 tablet by mouth 2 (two) times daily.    Marland Kitchen. glimepiride (AMARYL) 4 MG tablet Take 4 mg by mouth 2 (two) times daily.    . hydrochlorothiazide (MICROZIDE) 12.5 MG capsule Take 12.5 mg by mouth daily.    Marland Kitchen. HYDROcodone-acetaminophen  (NORCO/VICODIN) 5-325 MG per tablet Take 1 tablet by mouth every 8 (eight) hours as needed for pain.    Marland Kitchen. lisinopril (PRINIVIL,ZESTRIL) 40 MG tablet Take 80 mg by mouth at bedtime.    . rosuvastatin (CRESTOR) 40 MG tablet Take 40 mg by mouth daily.     No current facility-administered medications for this visit.    Review of Systems Review of Systems  Constitutional: Negative.   Respiratory: Negative.   Cardiovascular: Negative.     Blood pressure 130/70, pulse 88, temperature 98.2 F (36.8 C), temperature source Oral, resp. rate 14, height 4\' 11"  (1.499 m), weight 152 lb (68.947 kg).  Physical Exam Physical Exam  Constitutional: She is oriented to person, place, and time. She appears well-developed and well-nourished.  Pulmonary/Chest:    Genitourinary:     Neurological: She is alert and oriented to person, place, and time.  Skin: Skin is warm and dry.  3 x 5 cm abscessed area left chest wall mass   Data review PCP notes dated 09/15/2014.  Assessment    Cutaneous abscess.    Plan    Procedure for incision and drainage to accelerate resolution was reviewed with the patient. 20 mL of 0.5% Xylocaine with 0.25% Marcaine with 1-200,000 units  of epinephrine was instilled and supplemented with 3 mL of 1% plain Xylocaine. ChloraPrep was applied to the skin. A 5 x 20 mm ellipse of skin was excised 5 drainage. Loculations were broken up with a hemostat. Approximately 10 mL of thick purulent material consistent with staph was obtained. Culture was sent for routine aerobic organisms. Telfa wick and a bulky dressing was applied.  Wound care was reviewed with the patient. Supplies were provided.  A prescription for Norco 5/325, #30 with the inscription 1-2 by mouth every 4 hours when necessary for pain was provided.  Arrangements will be made for wound evaluation in 6 days.     PCP:  Grayland Ormond 09/16/2014, 6:05 AM

## 2014-09-16 DIAGNOSIS — L02213 Cutaneous abscess of chest wall: Secondary | ICD-10-CM | POA: Insufficient documentation

## 2014-09-19 LAB — ANAEROBIC AND AEROBIC CULTURE

## 2014-09-21 ENCOUNTER — Encounter: Payer: Self-pay | Admitting: General Surgery

## 2014-09-21 ENCOUNTER — Ambulatory Visit (INDEPENDENT_AMBULATORY_CARE_PROVIDER_SITE_OTHER): Payer: Medicare Other | Admitting: General Surgery

## 2014-09-21 VITALS — BP 130/74 | HR 70 | Resp 14 | Ht 59.0 in | Wt 149.0 lb

## 2014-09-21 DIAGNOSIS — L02213 Cutaneous abscess of chest wall: Secondary | ICD-10-CM

## 2014-09-21 NOTE — Patient Instructions (Signed)
Patient to use heat as needed. Return in three weeks.

## 2014-09-21 NOTE — Progress Notes (Signed)
Patient ID: Cheryl Mueller, female   DOB: 30-Jan-1941, 74 y.o.   MRN: [id number]  Chief Complaint  Patient presents with  . Routine Post Op    chest wall excision    HPI Cheryl Mueller is a 74 y.o. female here today for her post op chest wall excision done on 09/15/14. She states the area is draining a little.  HPI  Past Medical History  Diagnosis Date  . Hypertension   . Diabetes mellitus without complication   . TIA (transient ischemic attack)     approx 15 years ago  . Vertigo     hx of  . Hyperlipemia     Past Surgical History  Procedure Laterality Date  . Colonoscopy w/ polypectomy    . Lumbar laminectomy/decompression microdiscectomy Right 11/26/2012    Procedure: LUMBAR LAMINECTOMY/DECOMPRESSION MICRODISCECTOMY 1 LEVEL;  Surgeon: Hewitt Shortsobert W Nudelman, MD;  Location: [facility];  Service: Neurosurgery;  Laterality: Right;  Lumbar five-sacral one laminotomy and microdiskectomy     Family History  Problem Relation Age of Onset  . Stroke Mother     Social History History  Substance Use Topics  . Smoking status: Never Smoker   . Smokeless tobacco: Never Used  . Alcohol Use: No    Allergies  Allergen Reactions  . Atorvastatin Rash    Current Outpatient Prescriptions  Medication Sig Dispense Refill  . amLODipine (NORVASC) 10 MG tablet Take 10 mg by mouth daily.    Marland Kitchen. aspirin EC 81 MG tablet Take 81 mg by mouth daily.    Marland Kitchen. CALCIUM PO Take 1 tablet by mouth 2 (two) times daily.    Marland Kitchen. glimepiride (AMARYL) 4 MG tablet Take 4 mg by mouth 2 (two) times daily.    . hydrochlorothiazide (MICROZIDE) 12.5 MG capsule Take 12.5 mg by mouth daily.    Marland Kitchen. HYDROcodone-acetaminophen (NORCO/VICODIN) 5-325 MG per tablet Take 1 tablet by mouth every 8 (eight) hours as needed for pain.    Marland Kitchen. lisinopril (PRINIVIL,ZESTRIL) 40 MG tablet Take 80 mg by mouth at bedtime.    . rosuvastatin (CRESTOR) 40 MG tablet Take 40 mg by mouth daily.     No current facility-administered medications for this  visit.    Review of Systems Review of Systems  Constitutional: Negative.   Respiratory: Negative.   Cardiovascular: Negative.     Blood pressure 130/74, pulse 70, resp. rate 14, height 4\' 11"  (1.499 m), weight 149 lb (67.586 kg).  Physical Exam Physical Exam  Constitutional: She is oriented to person, place, and time. She appears well-developed and well-nourished.  Abdominal:  Chest wall incision looks clean and healing well.   Neurological: She is alert and oriented to person, place, and time.  Skin: Skin is warm and dry.    Data Reviewed Culture showed MRSA.  Assessment    Resolving chest wall abscess.    Plan   Patient to use heat as needed. Return in three weeks for assessment of need for residual cyst wall excision.     PCP:  Janeth RaseKlein Iii,   Earline MayotteByrnett, Yanni Quiroa W 09/22/2014, 4:06 PM

## 2014-09-27 ENCOUNTER — Ambulatory Visit: Payer: Medicare Other | Admitting: General Surgery

## 2014-10-13 ENCOUNTER — Ambulatory Visit: Payer: Medicare Other | Admitting: General Surgery

## 2014-11-23 ENCOUNTER — Encounter: Payer: Self-pay | Admitting: *Deleted

## 2015-02-20 ENCOUNTER — Ambulatory Visit
Admission: RE | Admit: 2015-02-20 | Discharge: 2015-02-20 | Disposition: A | Discharge status: Home / Self Care | Payer: Medicare Other | Source: Ambulatory Visit | Attending: Physician Assistant | Admitting: Physician Assistant

## 2015-02-20 ENCOUNTER — Other Ambulatory Visit: Payer: Self-pay | Admitting: Physician Assistant

## 2015-02-20 DIAGNOSIS — L03312 Cellulitis of back [any part except buttock]: Secondary | ICD-10-CM

## 2015-02-22 ENCOUNTER — Ambulatory Visit
Admission: RE | Admit: 2015-02-22 | Discharge: 2015-02-22 | Disposition: A | Discharge status: Home / Self Care | Payer: Medicare Other | Source: Ambulatory Visit | Attending: Physician Assistant | Admitting: Physician Assistant

## 2015-02-22 ENCOUNTER — Other Ambulatory Visit: Payer: Self-pay | Admitting: Physician Assistant

## 2015-02-22 DIAGNOSIS — L03312 Cellulitis of back [any part except buttock]: Secondary | ICD-10-CM

## 2015-02-22 DIAGNOSIS — M545 Low back pain, unspecified: Secondary | ICD-10-CM

## 2015-02-22 DIAGNOSIS — M799 Soft tissue disorder, unspecified: Secondary | ICD-10-CM | POA: Diagnosis present

## 2015-02-22 DIAGNOSIS — M7989 Other specified soft tissue disorders: Secondary | ICD-10-CM

## 2015-02-22 DIAGNOSIS — K8020 Calculus of gallbladder without cholecystitis without obstruction: Secondary | ICD-10-CM | POA: Insufficient documentation

## 2015-02-22 DIAGNOSIS — K449 Diaphragmatic hernia without obstruction or gangrene: Secondary | ICD-10-CM | POA: Diagnosis not present

## 2015-02-22 DIAGNOSIS — I700 Atherosclerosis of aorta: Secondary | ICD-10-CM | POA: Diagnosis not present

## 2015-02-22 DIAGNOSIS — N8110 Cystocele, unspecified: Secondary | ICD-10-CM | POA: Insufficient documentation

## 2015-02-22 DIAGNOSIS — K5790 Diverticulosis of intestine, part unspecified, without perforation or abscess without bleeding: Secondary | ICD-10-CM | POA: Diagnosis not present

## 2015-02-22 DIAGNOSIS — K6389 Other specified diseases of intestine: Secondary | ICD-10-CM | POA: Insufficient documentation

## 2015-02-22 MED ORDER — IOHEXOL 350 MG/ML SOLN
100.0000 mL | Freq: Once | INTRAVENOUS | Status: AC | PRN
  Administered 2015-02-22: 100 mL via INTRAVENOUS

## 2015-03-21 ENCOUNTER — Encounter
Admission: RE | Admit: 2015-03-21 | Discharge: 2015-03-21 | Disposition: A | Discharge status: Home / Self Care | Payer: Medicare Other | Source: Ambulatory Visit | Attending: Orthopedic Surgery | Admitting: Orthopedic Surgery

## 2015-03-21 DIAGNOSIS — M810 Age-related osteoporosis without current pathological fracture: Secondary | ICD-10-CM | POA: Diagnosis not present

## 2015-03-21 DIAGNOSIS — Z823 Family history of stroke: Secondary | ICD-10-CM | POA: Diagnosis not present

## 2015-03-21 DIAGNOSIS — Z79899 Other long term (current) drug therapy: Secondary | ICD-10-CM | POA: Diagnosis not present

## 2015-03-21 DIAGNOSIS — W19XXXA Unspecified fall, initial encounter: Secondary | ICD-10-CM | POA: Diagnosis not present

## 2015-03-21 DIAGNOSIS — L409 Psoriasis, unspecified: Secondary | ICD-10-CM | POA: Diagnosis not present

## 2015-03-21 DIAGNOSIS — I340 Nonrheumatic mitral (valve) insufficiency: Secondary | ICD-10-CM | POA: Diagnosis not present

## 2015-03-21 DIAGNOSIS — Z8673 Personal history of transient ischemic attack (TIA), and cerebral infarction without residual deficits: Secondary | ICD-10-CM | POA: Diagnosis not present

## 2015-03-21 DIAGNOSIS — I10 Essential (primary) hypertension: Secondary | ICD-10-CM | POA: Diagnosis not present

## 2015-03-21 DIAGNOSIS — Z833 Family history of diabetes mellitus: Secondary | ICD-10-CM | POA: Diagnosis not present

## 2015-03-21 DIAGNOSIS — E785 Hyperlipidemia, unspecified: Secondary | ICD-10-CM | POA: Diagnosis not present

## 2015-03-21 DIAGNOSIS — Z9889 Other specified postprocedural states: Secondary | ICD-10-CM | POA: Diagnosis not present

## 2015-03-21 DIAGNOSIS — Z7982 Long term (current) use of aspirin: Secondary | ICD-10-CM | POA: Diagnosis not present

## 2015-03-21 DIAGNOSIS — S82009A Unspecified fracture of unspecified patella, initial encounter for closed fracture: Secondary | ICD-10-CM | POA: Diagnosis present

## 2015-03-21 DIAGNOSIS — E119 Type 2 diabetes mellitus without complications: Secondary | ICD-10-CM | POA: Diagnosis not present

## 2015-03-21 DIAGNOSIS — Z8249 Family history of ischemic heart disease and other diseases of the circulatory system: Secondary | ICD-10-CM | POA: Diagnosis not present

## 2015-03-21 DIAGNOSIS — Z888 Allergy status to other drugs, medicaments and biological substances status: Secondary | ICD-10-CM | POA: Diagnosis not present

## 2015-03-21 DIAGNOSIS — M1990 Unspecified osteoarthritis, unspecified site: Secondary | ICD-10-CM | POA: Diagnosis not present

## 2015-03-21 NOTE — Patient Instructions (Signed)
  Your procedure is scheduled on: Thursday Sept. 29, 2016. Report to Same Day Surgery. To find out your arrival time please call [phone] between 1PM - 3PM on Wednesday Sept. 28, 2016.  Remember: Instructions that are not followed completely may result in serious medical risk, up to and including death, or upon the discretion of your surgeon and anesthesiologist your surgery may need to be rescheduled.    _x___ 1. Do not eat food or drink liquids after midnight. No gum chewing or hard candies.     ____ 2. No Alcohol for 24 hours before or after surgery.   ____ 3. Bring all medications with you on the day of surgery if instructed.    __x__ 4. Notify your doctor if there is any change in your medical condition     (cold, fever, infections).     Do not wear jewelry, make-up, hairpins, clips or nail polish.  Do not wear lotions, powders, or perfumes. You may wear deodorant.  Do not shave 48 hours prior to surgery. Men may shave face and neck.  Do not bring valuables to the hospital.    [facility] is not responsible for any belongings or valuables.               Contacts, dentures or bridgework may not be worn into surgery.  Leave your suitcase in the car. After surgery it may be brought to your room.  For patients admitted to the hospital, discharge time is determined by your treatment team.   Patients discharged the day of surgery will not be allowed to drive home.    Please read over the following fact sheets that you were given:   [facility] Preparing for Surgery  __x__ Take these medicines the morning of surgery with A SIP OF WATER:    1. amLODipine (NORVASC)  2. lisinopril (PRINIVIL,ZESTRIL)    ____ Fleet Enema (as directed)   _x___ Use CHG Soap as directed  ____ Use inhalers on the day of surgery  _x___ Stop metformin now.    ____ Take 1/2 of usual insulin dose the night before surgery and none on the morning of surgery.   _x___ Stop aspirin now.  _x___ Stop  Anti-inflammatories on now.  May take Tylenol or Hydrocodone.   ____ Stop supplements until after surgery.    ____ Bring C-Pap to the hospital.

## 2015-03-23 ENCOUNTER — Ambulatory Visit: Payer: Medicare Other

## 2015-03-23 ENCOUNTER — Ambulatory Visit: Payer: Medicare Other | Admitting: Anesthesiology

## 2015-03-23 ENCOUNTER — Encounter: Payer: Self-pay | Admitting: *Deleted

## 2015-03-23 ENCOUNTER — Encounter
Admission: RE | Disposition: A | Discharge status: Home / Self Care | Payer: Self-pay | Source: Ambulatory Visit | Attending: Orthopedic Surgery

## 2015-03-23 ENCOUNTER — Ambulatory Visit
Admission: RE | Admit: 2015-03-23 | Discharge: 2015-03-23 | Disposition: A | Discharge status: Home / Self Care | Payer: Medicare Other | Source: Ambulatory Visit | Attending: Orthopedic Surgery | Admitting: Orthopedic Surgery

## 2015-03-23 DIAGNOSIS — S82009A Unspecified fracture of unspecified patella, initial encounter for closed fracture: Secondary | ICD-10-CM | POA: Insufficient documentation

## 2015-03-23 DIAGNOSIS — E119 Type 2 diabetes mellitus without complications: Secondary | ICD-10-CM | POA: Insufficient documentation

## 2015-03-23 DIAGNOSIS — E785 Hyperlipidemia, unspecified: Secondary | ICD-10-CM | POA: Insufficient documentation

## 2015-03-23 DIAGNOSIS — L409 Psoriasis, unspecified: Secondary | ICD-10-CM | POA: Insufficient documentation

## 2015-03-23 DIAGNOSIS — Z419 Encounter for procedure for purposes other than remedying health state, unspecified: Secondary | ICD-10-CM

## 2015-03-23 DIAGNOSIS — Z9889 Other specified postprocedural states: Secondary | ICD-10-CM | POA: Insufficient documentation

## 2015-03-23 DIAGNOSIS — Z823 Family history of stroke: Secondary | ICD-10-CM | POA: Insufficient documentation

## 2015-03-23 DIAGNOSIS — M1990 Unspecified osteoarthritis, unspecified site: Secondary | ICD-10-CM | POA: Insufficient documentation

## 2015-03-23 DIAGNOSIS — Z8673 Personal history of transient ischemic attack (TIA), and cerebral infarction without residual deficits: Secondary | ICD-10-CM | POA: Insufficient documentation

## 2015-03-23 DIAGNOSIS — Z7982 Long term (current) use of aspirin: Secondary | ICD-10-CM | POA: Insufficient documentation

## 2015-03-23 DIAGNOSIS — Z833 Family history of diabetes mellitus: Secondary | ICD-10-CM | POA: Insufficient documentation

## 2015-03-23 DIAGNOSIS — Z79899 Other long term (current) drug therapy: Secondary | ICD-10-CM | POA: Insufficient documentation

## 2015-03-23 DIAGNOSIS — I10 Essential (primary) hypertension: Secondary | ICD-10-CM | POA: Insufficient documentation

## 2015-03-23 DIAGNOSIS — I340 Nonrheumatic mitral (valve) insufficiency: Secondary | ICD-10-CM | POA: Insufficient documentation

## 2015-03-23 DIAGNOSIS — Z8249 Family history of ischemic heart disease and other diseases of the circulatory system: Secondary | ICD-10-CM | POA: Insufficient documentation

## 2015-03-23 DIAGNOSIS — W19XXXA Unspecified fall, initial encounter: Secondary | ICD-10-CM | POA: Insufficient documentation

## 2015-03-23 DIAGNOSIS — Z888 Allergy status to other drugs, medicaments and biological substances status: Secondary | ICD-10-CM | POA: Insufficient documentation

## 2015-03-23 DIAGNOSIS — M810 Age-related osteoporosis without current pathological fracture: Secondary | ICD-10-CM | POA: Insufficient documentation

## 2015-03-23 HISTORY — PX: ORIF PATELLA: SHX5033

## 2015-03-23 LAB — GLUCOSE, CAPILLARY: GLUCOSE-CAPILLARY: 154 mg/dL — AB (ref 65–99)

## 2015-03-23 SURGERY — OPEN REDUCTION INTERNAL FIXATION (ORIF) PATELLA
Anesthesia: Choice | Site: Knee | Laterality: Left | Wound class: Clean

## 2015-03-23 MED ORDER — HYDROCODONE-ACETAMINOPHEN 5-325 MG PO TABS: 1.0000 | ORAL_TABLET | ORAL | Status: DC | PRN

## 2015-03-23 MED ORDER — MIDAZOLAM HCL 2 MG/2ML IJ SOLN
INTRAMUSCULAR | Status: DC | PRN
  Administered 2015-03-23: 2 mg via INTRAVENOUS

## 2015-03-23 MED ORDER — CEFAZOLIN SODIUM-DEXTROSE 2-3 GM-% IV SOLR
INTRAVENOUS | Status: AC
  Filled 2015-03-23: qty 50

## 2015-03-23 MED ORDER — FAMOTIDINE 20 MG PO TABS: 20.0000 mg | ORAL_TABLET | Freq: Once | ORAL | Status: AC

## 2015-03-23 MED ORDER — OXYCODONE HCL 5 MG PO TABS
ORAL_TABLET | ORAL | Status: AC
  Administered 2015-03-23: 5 mg via ORAL
  Filled 2015-03-23: qty 1

## 2015-03-23 MED ORDER — SODIUM CHLORIDE 0.9 % IV SOLN
INTRAVENOUS | Status: DC
  Administered 2015-03-23 (×2): via INTRAVENOUS

## 2015-03-23 MED ORDER — SODIUM CHLORIDE 0.9 % IV SOLN: INTRAVENOUS | Status: DC

## 2015-03-23 MED ORDER — ONDANSETRON HCL 4 MG PO TABS: 4.0000 mg | ORAL_TABLET | Freq: Four times a day (QID) | ORAL | Status: DC | PRN

## 2015-03-23 MED ORDER — NEOMYCIN-POLYMYXIN B GU 40-200000 IR SOLN
Status: AC
Start: 2015-03-23 — End: 2015-03-23
  Filled 2015-03-23: qty 2

## 2015-03-23 MED ORDER — HYDROCODONE-ACETAMINOPHEN 5-325 MG PO TABS: 1.0000 | ORAL_TABLET | Freq: Four times a day (QID) | ORAL | Status: DC | PRN

## 2015-03-23 MED ORDER — FENTANYL CITRATE (PF) 100 MCG/2ML IJ SOLN
INTRAMUSCULAR | Status: DC | PRN
  Administered 2015-03-23 (×2): 50 ug via INTRAVENOUS
  Administered 2015-03-23: 100 ug via INTRAVENOUS
  Administered 2015-03-23: 50 ug via INTRAVENOUS

## 2015-03-23 MED ORDER — METOCLOPRAMIDE HCL 5 MG/ML IJ SOLN: 5.0000 mg | Freq: Three times a day (TID) | INTRAMUSCULAR | Status: DC | PRN

## 2015-03-23 MED ORDER — OXYCODONE HCL 5 MG/5ML PO SOLN: 5.0000 mg | Freq: Once | ORAL | Status: AC | PRN

## 2015-03-23 MED ORDER — ONDANSETRON HCL 4 MG/2ML IJ SOLN
INTRAMUSCULAR | Status: DC | PRN
  Administered 2015-03-23: 4 mg via INTRAVENOUS

## 2015-03-23 MED ORDER — FENTANYL CITRATE (PF) 100 MCG/2ML IJ SOLN
INTRAMUSCULAR | Status: AC
  Administered 2015-03-23: 25 ug via INTRAVENOUS
  Filled 2015-03-23: qty 2

## 2015-03-23 MED ORDER — FENTANYL CITRATE (PF) 100 MCG/2ML IJ SOLN
25.0000 ug | INTRAMUSCULAR | Status: DC | PRN
  Administered 2015-03-23 (×5): 25 ug via INTRAVENOUS

## 2015-03-23 MED ORDER — FAMOTIDINE 20 MG PO TABS
ORAL_TABLET | ORAL | Status: AC
  Administered 2015-03-23: 20 mg via ORAL
  Filled 2015-03-23: qty 1

## 2015-03-23 MED ORDER — OXYCODONE HCL 5 MG PO TABS
5.0000 mg | ORAL_TABLET | Freq: Once | ORAL | Status: AC | PRN
  Administered 2015-03-23: 5 mg via ORAL

## 2015-03-23 MED ORDER — CEFAZOLIN SODIUM-DEXTROSE 2-3 GM-% IV SOLR: 2.0000 g | Freq: Once | INTRAVENOUS | Status: DC

## 2015-03-23 MED ORDER — PROPOFOL 10 MG/ML IV BOLUS
INTRAVENOUS | Status: DC | PRN
  Administered 2015-03-23: 100 mg via INTRAVENOUS
  Administered 2015-03-23: 50 mg via INTRAVENOUS

## 2015-03-23 MED ORDER — FENTANYL CITRATE (PF) 100 MCG/2ML IJ SOLN
INTRAMUSCULAR | Status: AC
  Filled 2015-03-23: qty 2

## 2015-03-23 MED ORDER — LIDOCAINE HCL (CARDIAC) 20 MG/ML IV SOLN
INTRAVENOUS | Status: DC | PRN
  Administered 2015-03-23: 100 mg via INTRAVENOUS

## 2015-03-23 MED ORDER — ONDANSETRON HCL 4 MG/2ML IJ SOLN: 4.0000 mg | Freq: Four times a day (QID) | INTRAMUSCULAR | Status: DC | PRN

## 2015-03-23 MED ORDER — PHENYLEPHRINE HCL 10 MG/ML IJ SOLN
INTRAMUSCULAR | Status: DC | PRN
  Administered 2015-03-23: 100 ug via INTRAVENOUS

## 2015-03-23 MED ORDER — NEOMYCIN-POLYMYXIN B GU 40-200000 IR SOLN
Status: DC | PRN
  Administered 2015-03-23: 2 mL

## 2015-03-23 MED ORDER — METOCLOPRAMIDE HCL 10 MG PO TABS: 5.0000 mg | ORAL_TABLET | Freq: Three times a day (TID) | ORAL | Status: DC | PRN

## 2015-03-23 MED ORDER — KETOROLAC TROMETHAMINE 30 MG/ML IJ SOLN
INTRAMUSCULAR | Status: DC | PRN
  Administered 2015-03-23: 30 mg via INTRAVENOUS

## 2015-03-23 SURGICAL SUPPLY — 43 items
BANDAGE ELASTIC 6 CLIP NS LF (GAUZE/BANDAGES/DRESSINGS) ×3 IMPLANT
BLADE SURG SZ10 CARB STEEL (BLADE) ×6 IMPLANT
BNDG COHESIVE 4X5 TAN STRL (GAUZE/BANDAGES/DRESSINGS) ×3 IMPLANT
BRACE KNEE POST OP SHORT (BRACE) ×3 IMPLANT
CANISTER SUCT 1200ML W/VALVE (MISCELLANEOUS) ×3 IMPLANT
CATH IV ANGIO 16GX3.25 GREY (CATHETERS) IMPLANT
CHLORAPREP W/TINT 26ML (MISCELLANEOUS) ×6 IMPLANT
DRAPE C-ARM XRAY 36X54 (DRAPES) ×3 IMPLANT
DRAPE C-ARMOR (DRAPES) ×3 IMPLANT
DRAPE INCISE IOBAN 66X45 STRL (DRAPES) ×3 IMPLANT
DRAPE U-SHAPE 47X51 STRL (DRAPES) ×3 IMPLANT
ELECT CAUTERY BLADE 6.4 (BLADE) ×3 IMPLANT
GAUZE PETRO XEROFOAM 1X8 (MISCELLANEOUS) ×3 IMPLANT
GAUZE SPONGE 4X4 12PLY STRL (GAUZE/BANDAGES/DRESSINGS) ×3 IMPLANT
GLOVE SURG ORTHO 9.0 STRL STRW (GLOVE) ×3 IMPLANT
GOWN SPECIALTY ULTRA XL (MISCELLANEOUS) ×3 IMPLANT
GOWN STRL REUS W/ TWL LRG LVL3 (GOWN DISPOSABLE) ×1 IMPLANT
GOWN STRL REUS W/TWL LRG LVL3 (GOWN DISPOSABLE) ×2
HANDLE YANKAUER SUCT BULB TIP (MISCELLANEOUS) ×3 IMPLANT
HEMOVAC 400CC 10FR (MISCELLANEOUS) IMPLANT
IMMOB KNEE 24 THIGH 24 443303 (SOFTGOODS) ×3 IMPLANT
IV CATH ANGIO 16GX3.25 GREY (CATHETERS)
NS IRRIG 500ML POUR BTL (IV SOLUTION) ×3 IMPLANT
PACK EXTREMITY ARMC (MISCELLANEOUS) ×3 IMPLANT
PAD ABD DERMACEA PRESS 5X9 (GAUZE/BANDAGES/DRESSINGS) ×3 IMPLANT
PAD CAST CTTN 4X4 STRL (SOFTGOODS) ×1 IMPLANT
PAD GROUND ADULT SPLIT (MISCELLANEOUS) ×3 IMPLANT
PADDING CAST COTTON 4X4 STRL (SOFTGOODS) ×2
REPAIR TROPE KNTLS SYNDESMOSIS (Orthopedic Implant) ×6 IMPLANT
SPONGE LAP 18X18 5 PK (GAUZE/BANDAGES/DRESSINGS) ×3 IMPLANT
STAPLER SKIN PROX 35W (STAPLE) ×3 IMPLANT
STOCKINETTE M/LG 89821 (MISCELLANEOUS) ×3 IMPLANT
STRAP SAFETY BODY (MISCELLANEOUS) ×3 IMPLANT
SUT FIBERWIRE #5 38 CONV BLUE (SUTURE)
SUT ORTHOCORD W/MULTIPK NDL (SUTURE) IMPLANT
SUT STEEL 7 (SUTURE) IMPLANT
SUT VIC AB 0 CT1 27 (SUTURE)
SUT VIC AB 0 CT1 27XCR 8 STRN (SUTURE) IMPLANT
SUT VIC AB 0 CT1 36 (SUTURE) IMPLANT
SUT VIC AB 2-0 CT1 27 (SUTURE)
SUT VIC AB 2-0 CT1 TAPERPNT 27 (SUTURE) IMPLANT
SUTURE FIBERWR #5 38 CONV BLUE (SUTURE) IMPLANT
SYRINGE 10CC LL (SYRINGE) ×3 IMPLANT

## 2015-03-23 NOTE — Addendum Note (Signed)
Addendum  created 03/23/15 1247 by Rosaria Ferries, MD   Modules edited: Orders, PRL Based Order Sets

## 2015-03-23 NOTE — Transfer of Care (Signed)
Immediate Anesthesia Transfer of Care Note  Patient: Cheryl Mueller  Procedure(s) Performed: Procedure(s): OPEN REDUCTION INTERNAL (ORIF) FIXATION PATELLA (Left)  Patient Location: PACU  Anesthesia Type:General  Level of Consciousness: sedated  Airway & Oxygen Therapy: Patient Spontanous Breathing and Patient connected to face mask oxygen  Post-op Assessment: Report given to RN and Post -op Vital signs reviewed and stable  Post vital signs: Reviewed and stable  Last Vitals:  Filed Vitals:   03/23/15 1225  BP: 129/59  Pulse: 86  Temp: 37.8 C  Resp: 20    Complications: No apparent anesthesia complications

## 2015-03-23 NOTE — Anesthesia Postprocedure Evaluation (Signed)
  Anesthesia Post-op Note  Patient: Cheryl Mueller  Procedure(s) Performed: Procedure(s): OPEN REDUCTION INTERNAL (ORIF) FIXATION PATELLA (Left)  Anesthesia type:No value filed.  Patient location: PACU  Post pain: Pain level controlled  Post assessment: Post-op Vital signs reviewed, Patient's Cardiovascular Status Stable, Respiratory Function Stable, Patent Airway and No signs of Nausea or vomiting  Post vital signs: Reviewed and stable  Last Vitals:  Filed Vitals:   03/23/15 1226  BP: 129/59  Pulse: 86  Temp: 37.8 C  Resp: 20    Level of consciousness: awake, alert  and patient cooperative  Complications: No apparent anesthesia complications

## 2015-03-23 NOTE — Discharge Instructions (Signed)
Activity as tolerated Weight Bearing as tolerated

## 2015-03-23 NOTE — Anesthesia Preprocedure Evaluation (Signed)
Anesthesia Evaluation  Patient identified by MRN, date of birth, ID band Patient awake    Reviewed: Allergy & Precautions, NPO status , Patient's Chart, lab work & pertinent test results  History of Anesthesia Complications Negative for: history of anesthetic complications  Airway Mallampati: II  TM Distance: >3 FB Neck ROM: Full    Dental  (+) Teeth Intact   Pulmonary neg pulmonary ROS,           Cardiovascular hypertension, Pt. on medications      Neuro/Psych TIA (right sided weakness, no residual)   GI/Hepatic Neg liver ROS, GERD (occassional prilosec)  Medicated and Poorly Controlled,  Endo/Other  diabetes, Oral Hypoglycemic Agents  Renal/GU negative Renal ROS     Musculoskeletal   Abdominal   Peds  Hematology   Anesthesia Other Findings   Reproductive/Obstetrics                             Anesthesia Physical Anesthesia Plan  ASA: III  Anesthesia Plan:    Post-op Pain Management:    Induction: Intravenous  Airway Management Planned: LMA  Additional Equipment:   Intra-op Plan:   Post-operative Plan:   Informed Consent: I have reviewed the patients History and Physical, chart, labs and discussed the procedure including the risks, benefits and alternatives for the proposed anesthesia with the patient or authorized representative who has indicated his/her understanding and acceptance.     Plan Discussed with:   Anesthesia Plan Comments:         Anesthesia Quick Evaluation

## 2015-03-23 NOTE — H&P (Signed)
Reviewed paper H+P, will be scanned into chart. No changes noted.  

## 2015-03-23 NOTE — Anesthesia Procedure Notes (Signed)
Procedure Name: LMA Insertion Performed by: NOLES, MARK Pre-anesthesia Checklist: Patient identified, Patient being monitored, Timeout performed, Emergency Drugs available and Suction available Patient Re-evaluated:Patient Re-evaluated prior to inductionOxygen Delivery Method: Circle system utilized Preoxygenation: Pre-oxygenation with 100% oxygen Intubation Type: IV induction Ventilation: Mask ventilation without difficulty LMA: LMA inserted LMA Size: 3.5 Tube type: Oral Number of attempts: 1 Placement Confirmation: positive ETCO2 and breath sounds checked- equal and bilateral Tube secured with: Tape Dental Injury: Teeth and Oropharynx as per pre-operative assessment        

## 2015-03-23 NOTE — Op Note (Signed)
03/23/2015  12:22 PM  PATIENT:  Cheryl Mueller  74 y.o. female  PRE-OPERATIVE DIAGNOSIS:  patella fx  POST-OPERATIVE DIAGNOSIS:  patella fx  PROCEDURE:  Procedure(s): OPEN REDUCTION INTERNAL (ORIF) FIXATION PATELLA (Left)  SURGEON: Leitha Schuller, MD  ASSISTANTS: None  ANESTHESIA:   general  EBL:    minimal   BLOOD ADMINISTERED:none  DRAINS: none   LOCAL MEDICATIONS USED:  NONE  SPECIMEN:  No Specimen  DISPOSITION OF SPECIMEN:  N/A  COUNTS:  YES  TOURNIQUET:   48 minutes at 300 mmHg   IMPLATight rope anchors 2 #5 FiberWire  DICTATION: .Dragon Dictation patient brought the operating room and after adequate general anesthesia was obtained, the left leg was prepped and draped in usual sterile fashion was turned by the upper thigh. After patient identification and timeout procedures were completed, tourniquet was raised to 3 mmHg. Midline skin incision was made exposing the patella fracture with displacement visible. The joint was irrigated and clot removed. A bone reduction clamp was then used to hold the patella in a reduced position. Using fluoroscopy on the lateral view guidewire was inserted on the medial side from proximal to distal over drilled and then a tight rope anchor placed identical procedure carried out on the lateral side. Sutures then passed through the anchors and a figure-of-eight fashion then the tight ropes were tightened down giving rigid fixation to the patella figure-of-eight sutures and tightening tied and with range of motion the patella wasn't anatomic alignment with no loss of reduction. The wound was irrigated and then closed with 2-0 Vicryl substantially and skin staples Xeroform 4 x 4 web roll Ace wrap and knee brace were applied with the knee locked in extension PLAN OF CARE: Discharge to home after PACU  PATIENT DISPOSITION:  PACU - hemodynamically stable.

## 2015-04-15 ENCOUNTER — Emergency Department: Payer: Medicare Other

## 2015-04-15 ENCOUNTER — Encounter: Payer: Self-pay | Admitting: Emergency Medicine

## 2015-04-15 ENCOUNTER — Emergency Department
Admission: EM | Admit: 2015-04-15 | Discharge: 2015-04-16 | Disposition: A | Discharge status: Home / Self Care | Payer: Medicare Other | Source: Home / Self Care | Attending: Emergency Medicine | Admitting: Emergency Medicine

## 2015-04-15 DIAGNOSIS — M545 Low back pain: Secondary | ICD-10-CM

## 2015-04-15 DIAGNOSIS — G8929 Other chronic pain: Secondary | ICD-10-CM

## 2015-04-15 DIAGNOSIS — S32000A Wedge compression fracture of unspecified lumbar vertebra, initial encounter for closed fracture: Secondary | ICD-10-CM

## 2015-04-15 MED ORDER — OXYCODONE-ACETAMINOPHEN 5-325 MG PO TABS
2.0000 | ORAL_TABLET | Freq: Once | ORAL | Status: AC
  Administered 2015-04-15: 2 via ORAL
  Filled 2015-04-15: qty 2

## 2015-04-15 MED ORDER — ONDANSETRON 4 MG PO TBDP
4.0000 mg | ORAL_TABLET | Freq: Once | ORAL | Status: AC
  Administered 2015-04-15: 4 mg via ORAL
  Filled 2015-04-15: qty 1

## 2015-04-15 NOTE — ED Notes (Signed)
Pt reports lower back pain x 3 weeks which became intolerable today.  Pt reports left knee surgery for fractured patella 3 weeks ago, when coming home had a fall and fractured L1.  Pt reports current nausea.  Pt NAD upon arrival, respirations equal and unlabored, skin warm and dry.

## 2015-04-15 NOTE — ED Notes (Signed)
MD at bedside. 

## 2015-04-16 NOTE — ED Provider Notes (Signed)
[facility] Emergency Department Provider Note  ____________________________________________  Time seen: Approximately 11:40 PM  I have reviewed the triage vital signs and the nursing notes.   HISTORY  Chief Complaint Back Pain    HPI Cheryl Mueller is a 74 y.o. female with chronic pain in her lower back from an L1 fracture that occurred about 3 weeks ago.  She reports that it has been constant but that it became intolerable today and she was having difficulty moving her right leg as a result.  She takes Norco prescribed by Dr. Rosita Kea but it was not helping.  She has some nausea as well.  She has not had any acute issues with bladder incontinence or retention.  She has had no loss of sensation.  She also had a recent knee surgery by Dr. Rosita Kea 3 weeks ago for a fractured patella on the left leg but that is healing well and she has no complaints at this time.  She had a mild headache today but denies vomiting, chest pain, shortness of breath, fever/chills, dysuria.  She has not pursued physical therapy for either her back issue or knee problem, but it has been offered.  She states that because she was having a harder time getting around today she thought maybe she should check into the hospital for a couple of days to give her husband a break.Marland Kitchen  Upon arrival the patient was complaining of severe pain but after 2 Percocets that she feels much better.   Past Medical History  Diagnosis Date  . Hypertension   . Diabetes mellitus without complication (HCC)   . TIA (transient ischemic attack)     approx 15 years ago  . Vertigo     hx of  . Hyperlipemia     Patient Active Problem List   Diagnosis Date Noted  . Chest wall abscess 09/16/2014    Past Surgical History  Procedure Laterality Date  . Colonoscopy w/ polypectomy    . Lumbar laminectomy/decompression microdiscectomy Right 11/26/2012    Procedure: LUMBAR LAMINECTOMY/DECOMPRESSION MICRODISCECTOMY 1 LEVEL;   Surgeon: Hewitt Shorts, MD;  Location: [facility];  Service: Neurosurgery;  Laterality: Right;  Lumbar five-sacral one laminotomy and microdiskectomy   . Orif patella Left 03/23/2015    Procedure: OPEN REDUCTION INTERNAL (ORIF) FIXATION PATELLA;  Surgeon: Kennedy Bucker, MD;  Location: [facility] ORS;  Service: Orthopedics;  Laterality: Left;    Current Outpatient Rx  Name  Route  Sig  Dispense  Refill  . amLODipine (NORVASC) 10 MG tablet   Oral   Take 10 mg by mouth every morning.          Marland Kitchen aspirin EC 81 MG tablet   Oral   Take 81 mg by mouth daily.         Marland Kitchen atorvastatin (LIPITOR) 80 MG tablet   Oral   Take 80 mg by mouth at bedtime.         Marland Kitchen CALCIUM PO   Oral   Take 1 tablet by mouth 2 (two) times daily.         Marland Kitchen glimepiride (AMARYL) 4 MG tablet   Oral   Take 4 mg by mouth 2 (two) times daily.         . hydrochlorothiazide (MICROZIDE) 12.5 MG capsule   Oral   Take 12.5 mg by mouth every morning.          Marland Kitchen HYDROcodone-acetaminophen (NORCO/VICODIN) 5-325 MG per tablet   Oral   Take  1 tablet by mouth every 8 (eight) hours as needed for pain.         Marland Kitchen. lisinopril (PRINIVIL,ZESTRIL) 40 MG tablet   Oral   Take 40 mg by mouth 2 (two) times daily.          Marland Kitchen. METFORMIN HCL ER, MOD, PO   Oral   Take 1 tablet by mouth 2 (two) times daily.         Marland Kitchen. HYDROcodone-acetaminophen (NORCO) 5-325 MG tablet   Oral   Take 1 tablet by mouth every 6 (six) hours as needed for moderate pain.   30 tablet   0   . ibuprofen (ADVIL,MOTRIN) 200 MG tablet   Oral   Take 200 mg by mouth every 6 (six) hours as needed.         . rosuvastatin (CRESTOR) 40 MG tablet   Oral   Take 40 mg by mouth daily.           Allergies Atorvastatin  Family History  Problem Relation Age of Onset  . Stroke Mother     Social History Social History  Substance Use Topics  . Smoking status: Never Smoker   . Smokeless tobacco: Never Used  . Alcohol Use: No    Review of  Systems Constitutional: No fever/chills Eyes: No visual changes. ENT: No sore throat. Cardiovascular: Denies chest pain. Respiratory: Denies shortness of breath. Gastrointestinal: No abdominal pain.  No nausea, no vomiting.  No diarrhea.  No constipation. Genitourinary: Negative for dysuria. Musculoskeletal: Pain in the lower back that has been constant for 3 weeks now worse.  Healing injury to left knee. Skin: Negative for rash. Neurological: Negative for headaches, focal weakness or numbness.  10-point ROS otherwise negative.  ____________________________________________   PHYSICAL EXAM:  VITAL SIGNS: ED Triage Vitals  Enc Vitals Group     BP 04/15/15 2139 176/71 mmHg     Pulse Rate 04/15/15 2139 100     Resp 04/15/15 2139 16     Temp 04/15/15 2136 98.9 F (37.2 C)     Temp src --      SpO2 04/15/15 2139 95 %     Weight 04/15/15 2136 148 lb (67.132 kg)     Height 04/15/15 2136 4\' 11"  (1.499 m)     Head Cir --      Peak Flow --      Pain Score 04/15/15 2136 7     Pain Loc --      Pain Edu? --      Excl. in GC? --     Constitutional: Alert and oriented. Well appearing and in no acute distress. Eyes: Conjunctivae are normal. PERRL. EOMI. Head: Atraumatic. Nose: No congestion/rhinnorhea. Mouth/Throat: Mucous membranes are moist.  Oropharynx non-erythematous. Neck: No stridor.   Cardiovascular: Normal rate, regular rhythm. Grossly normal heart sounds.  Good peripheral circulation. Respiratory: Normal respiratory effort.  No retractions. Lungs CTAB. Gastrointestinal: Soft and nontender. No distention. No abdominal bruits. No CVA tenderness. Musculoskeletal: No lower extremity tenderness nor edema.  No joint effusions.  Mild tenderness to palpation of the lower back with no obvious palpable deformities Neurologic:  Normal speech and language. No gross focal neurologic deficits are appreciated.  She has normal strength throughout the right lower extremity as well as the  left although is limited by the brace in place around her left knee Skin:  Skin is warm, dry and intact. No rash noted. Psychiatric: Mood and affect are normal. Speech and behavior are normal.  ____________________________________________   LABS (all labs ordered are listed, but only abnormal results are displayed)  Labs Reviewed - No data to display ____________________________________________  EKG  Not indicated ____________________________________________  RADIOLOGY   Dg Lumbar Spine 2-3 Views  04/16/2015  CLINICAL DATA:  Lumbosacral buttock pain for 3 weeks and an progressive. Fall 3 weeks prior with L1 fracture. EXAM: LUMBAR SPINE - 2-3 VIEW COMPARISON:  Report from radiographs 04/03/2015, images not available. FINDINGS: L1 compression deformity with greater than 50% loss of height anteriorly. There is minimal involvement of the posterior cortex but no definite retropulsion. No additional compression fracture. The alignment is otherwise maintained. The bones appear under mineralized. Posterior elements are intact. IMPRESSION: L1 compression deformity, with greater than 50% loss of height anteriorly and minimal posterior cortex involvement. This was described previously, however images not available for direct comparison to evaluate for imaging stability. No new fracture is seen. Electronically Signed   By: Rubye Oaks M.D.   On: 04/16/2015 00:16    ____________________________________________   PROCEDURES  Procedure(s) performed: None  Critical Care performed: No ____________________________________________   INITIAL IMPRESSION / ASSESSMENT AND PLAN / ED COURSE  Pertinent labs & imaging results that were available during my care of the patient were reviewed by me and considered in my medical decision making (see chart for details).  The patient does not appear to have any acute medical problems at this time.  I explained that her best option is to follow-up with  either Dr. Rosita Kea or Dr. Graciela Husbands, her PCP, to work with them to establish physical therapy.  She is in no pain at this time and has no neurological deficits.  I have no concerns about cauda equina.  There is no indication that she has had a CVA/TIA.  She is in good spirits and comfortable with the plan for outpatient follow-up.  ____________________________________________  FINAL CLINICAL IMPRESSION(S) / ED DIAGNOSES  Final diagnoses:  Lumbar compression fracture, closed, initial encounter (HCC)  Chronic low back pain      NEW MEDICATIONS STARTED DURING THIS VISIT:  New Prescriptions   No medications on file     Loleta Rose, MD 04/16/15 [phone]

## 2015-04-16 NOTE — Discharge Instructions (Signed)
As we discussed, your suffering from chronic back pain from the issues you have with your L spine.  Unfortunately there is nothing we can do to fix it at this time, but we recommend that you follow up with Dr. Rudene Christians and discuss additional management options including physical therapy.  Dr. Jens Som may also be able to help you with this issue.  The small abscess on your left buttock is draining from a small area and we recommend that you sit in warm baths several times a day and try to draw out the infection.  You may need incision and drainage and/or antibiotics at some point, but for right now I believe it can be managed conservatively.  Return to the Emergency Department with new or worsening symptoms that concern you.   Chronic Back Pain  When back pain lasts longer than 3 months, it is called chronic back pain.People with chronic back pain often go through certain periods that are more intense (flare-ups).  CAUSES Chronic back pain can be caused by wear and tear (degeneration) on different structures in your back. These structures include:  The bones of your spine (vertebrae) and the joints surrounding your spinal cord and nerve roots (facets).  The strong, fibrous tissues that connect your vertebrae (ligaments). Degeneration of these structures may result in pressure on your nerves. This can lead to constant pain. HOME CARE INSTRUCTIONS  Avoid bending, heavy lifting, prolonged sitting, and activities which make the problem worse.  Take brief periods of rest throughout the day to reduce your pain. Lying down or standing usually is better than sitting while you are resting.  Take over-the-counter or prescription medicines only as directed by your caregiver. SEEK IMMEDIATE MEDICAL CARE IF:   You have weakness or numbness in one of your legs or feet.  You have trouble controlling your bladder or bowels.  You have nausea, vomiting, abdominal pain, shortness of breath, or fainting.   This  information is not intended to replace advice given to you by your health care provider. Make sure you discuss any questions you have with your health care provider.   Document Released: 07/18/2004 Document Revised: 09/02/2011 Document Reviewed: 11/28/2014 Elsevier Interactive Patient Education 2016 [place].  Spinal Compression Fracture A spinal compression fracture is a collapse of the bones that form the spine (vertebrae). With this type of fracture, the vertebrae become squashed (compressed) into a wedge shape. Most compression fractures happen in the middle or lower part of the spine. CAUSES This condition may be caused by:  Thinning and loss of density in the bones (osteoporosis). This is the most common cause.  A fall.  A car or motorcycle accident.  Cancer.  Trauma, such as a heavy, direct hit to the head. RISK FACTORS You may be at greater risk for a spinal compression fracture if you:  Are 31 years old or older.  Have osteoporosis.  Have certain types of cancer, including:  Multiple myeloma.  Lymphoma.  Prostate cancer.  Lung cancer.  Breast cancer. SYMPTOMS Symptoms of this condition include:  Severe pain.  Pain that gets worse over time.  Pain that is worse when you stand, walk, sit, or bend.  Sudden pain that is so bad that it is hard for you to move.  Bending or humping of the spine.  Gradual loss of height.  Numbness, tingling, or weakness in the back and legs.  Trouble walking. Your symptoms will depend on the cause of the fracture and how quickly it develops.  For example, fractures that are caused by osteoporosis can cause few symptoms, no symptoms, or symptoms that develop slowly over time. DIAGNOSIS This condition may be diagnosed based on symptoms, medical history, and a physical exam. During the physical exam, your health care provider may tap along the length of your spine to check for tenderness. Tests may be done to confirm the  diagnosis. They may include:  A bone density test to check for osteoporosis.  Imaging tests, such as a spine X-ray, a CT scan, or MRI. TREATMENT Treatment for this condition depends on the cause and severity of the condition.Some fractures, such as those that are caused by osteoporosis, may heal on their own with supportive care. This may include:  Pain medicine.  Rest.  A back brace.  Physical therapy exercises.  Medicine that reduces bone pain.  Calcium and vitamin D supplements. Fractures that cause the back to become misshapen, cause nerve pain or weakness, or do not respond to other treatment may be treated with a surgical procedure, such as:  Vertebroplasty. In this procedure, bone cement is injected into the collapsed vertebrae to stabilize them.  Balloon kyphoplasty. In this procedure, the collapsed vertebrae are expanded with a balloon and then bone cement is injected into them.  Spinal fusion. In this procedure, the collapsed vertebrae are connected (fused) to normal vertebrae. HOME CARE INSTRUCTIONS General Instructions  Take medicines only as directed by your health care provider.  Do not drive or operate heavy machinery while taking pain medicine.  If directed, apply ice to the injured area:  Put ice in a plastic bag.  Place a towel between your skin and the bag.  Leave the ice on for 30 minutes every two hours at first. Then apply the ice as needed.  Wear your neck brace or back brace as directed by your health care provider.  Do not drink alcohol. Alcohol can interfere with your treatment.  Keep all follow-up visits as directed by your health care provider. This is important. It can help to prevent permanent injury, disability, and long-lasting (chronic) pain. Activity  Stay in bed (on bed rest) only as directed by your health care provider. Being on bed rest for too long can make your condition worse.  Return to your normal activities as directed by  your health care provider. Ask what activities are safe for you.  Do exercises to improve motion and strength in your back (physical therapy), as recommended by your health care provider.  Exercise regularly as directed by your health care provider. SEEK MEDICAL CARE IF:  You have a fever.  You develop a cough that makes your pain worse.  Your pain medicine is not helping.  Your pain does not get better over time.  You cannot return to your normal activities as planned or expected. SEEK IMMEDIATE MEDICAL CARE IF:  Your pain is very bad and it suddenly gets worse.  You are unable to move any body part (paralysis) that is below the level of your injury.  You have numbness, tingling, or weakness in any body part that is below the level of your injury.  You cannot control your bladder or bowels.   This information is not intended to replace advice given to you by your health care provider. Make sure you discuss any questions you have with your health care provider.   Document Released: 06/10/2005 Document Revised: 10/25/2014 Document Reviewed: 06/14/2014 Elsevier Interactive Patient Education [employer].

## 2015-04-17 ENCOUNTER — Inpatient Hospital Stay
Admission: AD | Admit: 2015-04-17 | Discharge: 2015-04-21 | DRG: 478 | Disposition: A | Discharge status: Skilled Nursing Facility | Payer: Medicare Other | Source: Ambulatory Visit | Attending: Internal Medicine | Admitting: Internal Medicine

## 2015-04-17 ENCOUNTER — Inpatient Hospital Stay: Payer: Medicare Other

## 2015-04-17 ENCOUNTER — Encounter: Payer: Self-pay | Admitting: Internal Medicine

## 2015-04-17 DIAGNOSIS — Z79891 Long term (current) use of opiate analgesic: Secondary | ICD-10-CM

## 2015-04-17 DIAGNOSIS — E538 Deficiency of other specified B group vitamins: Secondary | ICD-10-CM | POA: Diagnosis present

## 2015-04-17 DIAGNOSIS — I959 Hypotension, unspecified: Secondary | ICD-10-CM | POA: Diagnosis present

## 2015-04-17 DIAGNOSIS — S32010A Wedge compression fracture of first lumbar vertebra, initial encounter for closed fracture: Secondary | ICD-10-CM | POA: Diagnosis present

## 2015-04-17 DIAGNOSIS — E860 Dehydration: Secondary | ICD-10-CM | POA: Diagnosis present

## 2015-04-17 DIAGNOSIS — W19XXXA Unspecified fall, initial encounter: Secondary | ICD-10-CM | POA: Diagnosis present

## 2015-04-17 DIAGNOSIS — Y929 Unspecified place or not applicable: Secondary | ICD-10-CM | POA: Diagnosis not present

## 2015-04-17 DIAGNOSIS — L0291 Cutaneous abscess, unspecified: Secondary | ICD-10-CM

## 2015-04-17 DIAGNOSIS — Z7982 Long term (current) use of aspirin: Secondary | ICD-10-CM

## 2015-04-17 DIAGNOSIS — Z7984 Long term (current) use of oral hypoglycemic drugs: Secondary | ICD-10-CM

## 2015-04-17 DIAGNOSIS — I10 Essential (primary) hypertension: Secondary | ICD-10-CM | POA: Diagnosis present

## 2015-04-17 DIAGNOSIS — Z79899 Other long term (current) drug therapy: Secondary | ICD-10-CM

## 2015-04-17 DIAGNOSIS — S32019A Unspecified fracture of first lumbar vertebra, initial encounter for closed fracture: Principal | ICD-10-CM | POA: Diagnosis present

## 2015-04-17 DIAGNOSIS — Z8673 Personal history of transient ischemic attack (TIA), and cerebral infarction without residual deficits: Secondary | ICD-10-CM | POA: Diagnosis not present

## 2015-04-17 DIAGNOSIS — S300XXA Contusion of lower back and pelvis, initial encounter: Secondary | ICD-10-CM | POA: Diagnosis present

## 2015-04-17 DIAGNOSIS — E876 Hypokalemia: Secondary | ICD-10-CM | POA: Diagnosis present

## 2015-04-17 DIAGNOSIS — L0390 Cellulitis, unspecified: Secondary | ICD-10-CM | POA: Diagnosis present

## 2015-04-17 DIAGNOSIS — N179 Acute kidney failure, unspecified: Secondary | ICD-10-CM | POA: Diagnosis present

## 2015-04-17 DIAGNOSIS — E785 Hyperlipidemia, unspecified: Secondary | ICD-10-CM | POA: Diagnosis present

## 2015-04-17 DIAGNOSIS — E119 Type 2 diabetes mellitus without complications: Secondary | ICD-10-CM

## 2015-04-17 DIAGNOSIS — M4856XA Collapsed vertebra, not elsewhere classified, lumbar region, initial encounter for fracture: Secondary | ICD-10-CM

## 2015-04-17 DIAGNOSIS — Z888 Allergy status to other drugs, medicaments and biological substances status: Secondary | ICD-10-CM | POA: Diagnosis not present

## 2015-04-17 DIAGNOSIS — D62 Acute posthemorrhagic anemia: Secondary | ICD-10-CM | POA: Diagnosis present

## 2015-04-17 DIAGNOSIS — E1165 Type 2 diabetes mellitus with hyperglycemia: Secondary | ICD-10-CM | POA: Diagnosis present

## 2015-04-17 DIAGNOSIS — L03317 Cellulitis of buttock: Secondary | ICD-10-CM | POA: Diagnosis present

## 2015-04-17 DIAGNOSIS — G8929 Other chronic pain: Secondary | ICD-10-CM | POA: Diagnosis present

## 2015-04-17 DIAGNOSIS — Z419 Encounter for procedure for purposes other than remedying health state, unspecified: Secondary | ICD-10-CM

## 2015-04-17 DIAGNOSIS — T148XXA Other injury of unspecified body region, initial encounter: Secondary | ICD-10-CM

## 2015-04-17 LAB — COMPREHENSIVE METABOLIC PANEL
ALBUMIN: 3 g/dL — AB (ref 3.5–5.0)
ALK PHOS: 124 U/L (ref 38–126)
ALT: 21 U/L (ref 14–54)
AST: 34 U/L (ref 15–41)
Anion gap: 12 (ref 5–15)
BILIRUBIN TOTAL: 0.4 mg/dL (ref 0.3–1.2)
BUN: 24 mg/dL — AB (ref 6–20)
CALCIUM: 9.4 mg/dL (ref 8.9–10.3)
CO2: 25 mmol/L (ref 22–32)
Chloride: 93 mmol/L — ABNORMAL LOW (ref 101–111)
Creatinine, Ser: 1.32 mg/dL — ABNORMAL HIGH (ref 0.44–1.00)
GFR calc Af Amer: 44 mL/min — ABNORMAL LOW (ref >60)
GFR calc non Af Amer: 38 mL/min — ABNORMAL LOW (ref >60)
GLUCOSE: 294 mg/dL — AB (ref 65–99)
Potassium: 2.7 mmol/L — CL (ref 3.5–5.1)
Sodium: 130 mmol/L — ABNORMAL LOW (ref 135–145)
TOTAL PROTEIN: 6.9 g/dL (ref 6.5–8.1)

## 2015-04-17 LAB — CBC WITH DIFFERENTIAL/PLATELET
BASOS ABS: 0 10*3/uL (ref 0–0.1)
Basophils Relative: 0 %
Eosinophils Absolute: 0 10*3/uL (ref 0–0.7)
Eosinophils Relative: 0 %
HEMATOCRIT: 35.2 % (ref 35.0–47.0)
Hemoglobin: 11.8 g/dL — ABNORMAL LOW (ref 12.0–16.0)
LYMPHS PCT: 4 %
Lymphs Abs: 0.7 10*3/uL — ABNORMAL LOW (ref 1.0–3.6)
MCH: 29.6 pg (ref 26.0–34.0)
MCHC: 33.5 g/dL (ref 32.0–36.0)
MCV: 88.2 fL (ref 80.0–100.0)
MONO ABS: 1.1 10*3/uL — AB (ref 0.2–0.9)
Monocytes Relative: 6 %
Neutro Abs: 16.5 10*3/uL — ABNORMAL HIGH (ref 1.4–6.5)
Neutrophils Relative %: 90 %
Platelets: 298 10*3/uL (ref 150–440)
RBC: 3.99 MIL/uL (ref 3.80–5.20)
RDW: 14.4 % (ref 11.5–14.5)
WBC: 18.3 10*3/uL — ABNORMAL HIGH (ref 3.6–11.0)

## 2015-04-17 LAB — GLUCOSE, CAPILLARY
Glucose-Capillary: 235 mg/dL — ABNORMAL HIGH (ref 65–99)
Glucose-Capillary: 172 mg/dL — ABNORMAL HIGH (ref 65–99)

## 2015-04-17 LAB — MAGNESIUM: Magnesium: 1.7 mg/dL (ref 1.7–2.4)

## 2015-04-17 MED ORDER — IOHEXOL 300 MG/ML  SOLN
75.0000 mL | Freq: Once | INTRAMUSCULAR | Status: AC | PRN
  Administered 2015-04-17: 75 mL via INTRAVENOUS

## 2015-04-17 MED ORDER — AMLODIPINE BESYLATE 10 MG PO TABS
10.0000 mg | ORAL_TABLET | Freq: Every day | ORAL | Status: DC
  Filled 2015-04-17: qty 1

## 2015-04-17 MED ORDER — POTASSIUM CHLORIDE CRYS ER 10 MEQ PO TBCR
10.0000 meq | EXTENDED_RELEASE_TABLET | Freq: Two times a day (BID) | ORAL | Status: DC
  Administered 2015-04-17 – 2015-04-19 (×5): 10 meq via ORAL
  Filled 2015-04-17 (×5): qty 1

## 2015-04-17 MED ORDER — INSULIN ASPART 100 UNIT/ML ~~LOC~~ SOLN
0.0000 [IU] | Freq: Three times a day (TID) | SUBCUTANEOUS | Status: DC
  Administered 2015-04-17 – 2015-04-18 (×2): 8 [IU] via SUBCUTANEOUS
  Administered 2015-04-18: 4 [IU] via SUBCUTANEOUS
  Administered 2015-04-18: 20 [IU] via SUBCUTANEOUS
  Administered 2015-04-19: 8 [IU] via SUBCUTANEOUS
  Administered 2015-04-19 – 2015-04-20 (×3): 4 [IU] via SUBCUTANEOUS
  Administered 2015-04-20: 8 [IU] via SUBCUTANEOUS
  Administered 2015-04-21: 2 [IU] via SUBCUTANEOUS
  Filled 2015-04-17: qty 8
  Filled 2015-04-17: qty 4
  Filled 2015-04-17 (×2): qty 8
  Filled 2015-04-17 (×2): qty 4
  Filled 2015-04-17: qty 8
  Filled 2015-04-17: qty 20
  Filled 2015-04-17: qty 4
  Filled 2015-04-17: qty 2

## 2015-04-17 MED ORDER — TRAZODONE HCL 50 MG PO TABS
25.0000 mg | ORAL_TABLET | Freq: Every evening | ORAL | Status: DC | PRN
  Administered 2015-04-18 – 2015-04-20 (×3): 25 mg via ORAL
  Filled 2015-04-17 (×2): qty 1
  Filled 2015-04-17: qty 2

## 2015-04-17 MED ORDER — VANCOMYCIN HCL IN DEXTROSE 750-5 MG/150ML-% IV SOLN
750.0000 mg | INTRAVENOUS | Status: DC
  Administered 2015-04-19: 750 mg via INTRAVENOUS
  Filled 2015-04-17: qty 150

## 2015-04-17 MED ORDER — VANCOMYCIN HCL IN DEXTROSE 750-5 MG/150ML-% IV SOLN
750.0000 mg | Freq: Once | INTRAVENOUS | Status: AC
  Administered 2015-04-18: 750 mg via INTRAVENOUS
  Filled 2015-04-17 (×2): qty 150

## 2015-04-17 MED ORDER — MORPHINE SULFATE (PF) 2 MG/ML IV SOLN
2.0000 mg | INTRAVENOUS | Status: DC | PRN
  Administered 2015-04-17 – 2015-04-21 (×7): 2 mg via INTRAVENOUS
  Filled 2015-04-17 (×7): qty 1

## 2015-04-17 MED ORDER — DOCUSATE SODIUM 100 MG PO CAPS
100.0000 mg | ORAL_CAPSULE | Freq: Two times a day (BID) | ORAL | Status: DC
  Administered 2015-04-17 – 2015-04-20 (×5): 100 mg via ORAL
  Filled 2015-04-17 (×5): qty 1

## 2015-04-17 MED ORDER — ACETAMINOPHEN 650 MG RE SUPP: 650.0000 mg | Freq: Four times a day (QID) | RECTAL | Status: DC | PRN

## 2015-04-17 MED ORDER — VANCOMYCIN HCL IN DEXTROSE 750-5 MG/150ML-% IV SOLN
750.0000 mg | Freq: Once | INTRAVENOUS | Status: AC
  Administered 2015-04-17: 750 mg via INTRAVENOUS
  Filled 2015-04-17: qty 150

## 2015-04-17 MED ORDER — PIPERACILLIN-TAZOBACTAM 3.375 G IVPB
3.3750 g | Freq: Three times a day (TID) | INTRAVENOUS | Status: DC
  Administered 2015-04-17 – 2015-04-18 (×4): 3.375 g via INTRAVENOUS
  Filled 2015-04-17 (×6): qty 50

## 2015-04-17 MED ORDER — ONDANSETRON HCL 4 MG PO TABS: 4.0000 mg | ORAL_TABLET | Freq: Four times a day (QID) | ORAL | Status: DC | PRN

## 2015-04-17 MED ORDER — PANTOPRAZOLE SODIUM 40 MG PO TBEC
40.0000 mg | DELAYED_RELEASE_TABLET | Freq: Every day | ORAL | Status: DC
  Administered 2015-04-17 – 2015-04-21 (×5): 40 mg via ORAL
  Filled 2015-04-17 (×5): qty 1

## 2015-04-17 MED ORDER — HYDROCODONE-ACETAMINOPHEN 5-325 MG PO TABS
1.0000 | ORAL_TABLET | ORAL | Status: DC | PRN
  Administered 2015-04-17 – 2015-04-21 (×12): 2 via ORAL
  Filled 2015-04-17 (×13): qty 2

## 2015-04-17 MED ORDER — SODIUM CHLORIDE 0.9 % IV SOLN
INTRAVENOUS | Status: DC
  Administered 2015-04-17: 1 mL via INTRAVENOUS

## 2015-04-17 MED ORDER — ACETAMINOPHEN 325 MG PO TABS: 650.0000 mg | ORAL_TABLET | Freq: Four times a day (QID) | ORAL | Status: DC | PRN

## 2015-04-17 MED ORDER — LISINOPRIL 20 MG PO TABS
20.0000 mg | ORAL_TABLET | Freq: Every day | ORAL | Status: DC
  Administered 2015-04-18: 20 mg via ORAL
  Filled 2015-04-17 (×2): qty 1

## 2015-04-17 MED ORDER — ONDANSETRON HCL 4 MG/2ML IJ SOLN
4.0000 mg | Freq: Four times a day (QID) | INTRAMUSCULAR | Status: DC | PRN
Start: 2015-04-17 — End: 2015-04-21
  Administered 2015-04-17: 4 mg via INTRAVENOUS
  Filled 2015-04-17: qty 2

## 2015-04-17 MED ORDER — MAGNESIUM OXIDE 400 (241.3 MG) MG PO TABS
400.0000 mg | ORAL_TABLET | Freq: Every day | ORAL | Status: DC
  Administered 2015-04-17 – 2015-04-20 (×4): 400 mg via ORAL
  Filled 2015-04-17 (×4): qty 1

## 2015-04-17 MED ORDER — IOHEXOL 240 MG/ML SOLN
50.0000 mL | INTRAMUSCULAR | Status: AC
  Administered 2015-04-17 (×2): 50 mL via ORAL

## 2015-04-17 MED ORDER — GLIMEPIRIDE 2 MG PO TABS
2.0000 mg | ORAL_TABLET | Freq: Every day | ORAL | Status: DC
  Administered 2015-04-18: 2 mg via ORAL
  Filled 2015-04-17: qty 1

## 2015-04-17 MED ORDER — POTASSIUM CHLORIDE 10 MEQ/100ML IV SOLN
10.0000 meq | INTRAVENOUS | Status: AC
  Administered 2015-04-17 (×3): 10 meq via INTRAVENOUS
  Filled 2015-04-17 (×3): qty 100

## 2015-04-17 NOTE — H&P (Signed)
Cheryl Mueller is an 74 y.o. female.   Chief Complaint: Buttock pain GNF:[id number]:Patient with hypertension, hyperlipidemia, Diabetes, status post ORIF left patella September 2016, compounded by fall afterward. Follow-up films revealing L1 compression fracture. Noted to have small abscess on left buttock 10/22 in emergency room. Increasing pain in left [place] leg, limiting activity. Evaluation reveals firmness underneath the skin which is worrisome for hematoma; superficial abraded area, raising possibility of infected hematoma. Given its progression now interference with walking, patient admitted for further treatment, surgical consultation.  Past Medical History  Diagnosis Date  . Hypertension   . Diabetes mellitus without complication (HCC)   . TIA (transient ischemic attack)     approx 15 years ago  . Vertigo     hx of  . Hyperlipemia     Past Surgical History  Procedure Laterality Date  . Colonoscopy w/ polypectomy    . Lumbar laminectomy/decompression microdiscectomy Right 11/26/2012    Procedure: LUMBAR LAMINECTOMY/DECOMPRESSION MICRODISCECTOMY 1 LEVEL;  Surgeon: Hewitt Shortsobert W Nudelman, MD;  Location: [facility];  Service: Neurosurgery;  Laterality: Right;  Lumbar five-sacral one laminotomy and microdiskectomy   . Orif patella Left 03/23/2015    Procedure: OPEN REDUCTION INTERNAL (ORIF) FIXATION PATELLA;  Surgeon: Kennedy BuckerMichael Menz, MD;  Location: [facility];  Service: Orthopedics;  Laterality: Left;    Family History  Problem Relation Age of Onset  . Stroke Mother    Social History:  reports that she has never smoked. She has never used smokeless tobacco. She reports that she does not drink alcohol or use illicit drugs.  Allergies:  Allergies  Allergen Reactions  . Alendronate Other (See Comments)  . Atorvastatin Rash  . Formaldehyde Rash    Medications Prior to Admission  Medication Sig Dispense Refill  . amLODipine (NORVASC) 10 MG tablet Take 10 mg by mouth every morning.     Marland Kitchen.  aspirin EC 81 MG tablet Take 81 mg by mouth daily.    Marland Kitchen. atorvastatin (LIPITOR) 80 MG tablet Take 80 mg by mouth at bedtime.    Marland Kitchen. CALCIUM PO Take 1 tablet by mouth 2 (two) times daily.    Marland Kitchen. glimepiride (AMARYL) 4 MG tablet Take 4 mg by mouth 2 (two) times daily.    . hydrochlorothiazide (MICROZIDE) 12.5 MG capsule Take 12.5 mg by mouth every morning.     Marland Kitchen. HYDROcodone-acetaminophen (NORCO) 5-325 MG tablet Take 1 tablet by mouth every 6 (six) hours as needed for moderate pain. 30 tablet 0  . HYDROcodone-acetaminophen (NORCO/VICODIN) 5-325 MG per tablet Take 1 tablet by mouth every 8 (eight) hours as needed for pain.    Marland Kitchen. ibuprofen (ADVIL,MOTRIN) 200 MG tablet Take 200 mg by mouth every 6 (six) hours as needed.    Marland Kitchen. lisinopril (PRINIVIL,ZESTRIL) 40 MG tablet Take 40 mg by mouth 2 (two) times daily.     Marland Kitchen. METFORMIN HCL ER, MOD, PO Take 1 tablet by mouth 2 (two) times daily.    . rosuvastatin (CRESTOR) 40 MG tablet Take 40 mg by mouth daily.      No results found for this or any previous visit (from the past 48 hour(s)). Dg Lumbar Spine 2-3 Views  04/16/2015  CLINICAL DATA:  Lumbosacral buttock pain for 3 weeks and an progressive. Fall 3 weeks prior with L1 fracture. EXAM: LUMBAR SPINE - 2-3 VIEW COMPARISON:  Report from radiographs 04/03/2015, images not available. FINDINGS: L1 compression deformity with greater than 50% loss of height anteriorly. There is minimal involvement of the posterior cortex but  no definite retropulsion. No additional compression fracture. The alignment is otherwise maintained. The bones appear under mineralized. Posterior elements are intact. IMPRESSION: L1 compression deformity, with greater than 50% loss of height anteriorly and minimal posterior cortex involvement. This was described previously, however images not available for direct comparison to evaluate for imaging stability. No new fracture is seen. Electronically Signed   By: Rubye Oaks M.D.   On: 04/16/2015 00:16     ROS  There were no vitals taken for this visit.   General: well-developed female, appears uncomfortable HEENT: PERRL, EOMI, OP moist without lesions Neck: supple, trachea midline; no thyromegaly Chest: normal to palpation Lungs: clear bilaterally without wheeze or retractions Cardiac: RRR with 2/6 murmur Abd: Soft, nontender, nondistended, positive bowel sounds.  No organomegaly Ext: Left knee in brace Neuro: CN grossly intact. Left leg motion Limited by pain Derm: Induration and erythema across the left buttock   Assessment/Plan 1.  Hematoma with possible infection. Started empiric antibiotics. Surgical consultation. CT scan to further delineate   2. Left patella fracture repair/L1 compression fracture. Orthopedics consultation 3. Hypertension. Continue medications and monitor 4. Diabetes. Cover with sliding scale.   Curtis Sites III 04/17/2015, 4:17 PM

## 2015-04-17 NOTE — Progress Notes (Signed)
ANTIBIOTIC CONSULT NOTE - INITIAL  Pharmacy Consult for Vancomycin/Zosyn Indication: infected hematoma  Allergies  Allergen Reactions  . Alendronate Other (See Comments)    Reaction:  Leg pain   . Atorvastatin Rash  . Formaldehyde Rash    Patient Measurements:   Adjusted Body Weight: 53 kg Wt: 67.1 kg, Ht: 59 in  Vital Signs: Temp: 98 F (36.7 C) (10/24 1639) Temp Source: Oral (10/24 1639) BP: 123/55 mmHg (10/24 1638) Pulse Rate: 96 (10/24 1639) Intake/Output from previous day:   Intake/Output from this shift:    Labs:  Recent Labs  04/17/15 1640  WBC 18.3*  HGB 11.8*  PLT 298  CREATININE 1.32*   Estimated Creatinine Clearance: 30.2 mL/min (by C-G formula based on Cr of 1.32). No results for input(s): VANCOTROUGH, VANCOPEAK, VANCORANDOM, GENTTROUGH, GENTPEAK, GENTRANDOM, TOBRATROUGH, TOBRAPEAK, TOBRARND, AMIKACINPEAK, AMIKACINTROU, AMIKACIN in the last 72 hours.   Microbiology: No results found for this or any previous visit (from the past 720 hour(s)).  Medical History: Past Medical History  Diagnosis Date  . Hypertension   . Diabetes mellitus without complication (HCC)   . TIA (transient ischemic attack)     approx 15 years ago  . Vertigo     hx of  . Hyperlipemia     Medications:  Scheduled:  . amLODipine  10 mg Oral Daily  . docusate sodium  100 mg Oral BID  . [START ON 04/18/2015] glimepiride  2 mg Oral Q breakfast  . insulin aspart  0-24 Units Subcutaneous TID WC  . lisinopril  20 mg Oral Daily  . pantoprazole  40 mg Oral Daily  . piperacillin-tazobactam (ZOSYN)  IV  3.375 g Intravenous 3 times per day  . potassium chloride  10 mEq Oral BID  . potassium chloride  10 mEq Intravenous Q1 Hr x 3  . vancomycin  750 mg Intravenous Once  . [START ON 04/18/2015] vancomycin  750 mg Intravenous Once  . [START ON 04/19/2015] vancomycin  750 mg Intravenous Q24H   Assessment: Patient is a 74 yo female admitted for possible infected hematoma.  MD  desires empiric antibiotic coverage with Vancomycin and Zosyn.  SCr: 1.32, est CrCl~30 mL/min, ke: 0.029, t1/2: 23.9 h, Vd: 37.1 (based on adj bw)  Goal of Therapy:  Vancomycin trough goal: 15-20 (possible abscess; surgical consult pending)  Plan:  Will order Vancomcyin 750 mg IV once with a stacked dose orders for ~11.5 hours after.  Will then start maintenance dosing of Vancomycin 750 mg IV q24h.  Will check trough prior to dose on 10/28 at 0630 (should be at steady state).   Will start patient on Zosyn 3.375 gm IV q8h per EI protocol based on renal function.  BMP ordered in AM to assess renal function.  Pharmacy will continue to follow.   Dasan Hardman G 04/17/2015,6:52 PM

## 2015-04-17 NOTE — Progress Notes (Signed)
Lab notified RN of a potassium of 2.7. MD notified, new orders are potassium 10 mEq x3 IVPB with potassium PO 10 mEq BID. Will continue to monitor pt.   Karsten RoLauren E Hobbs

## 2015-04-18 ENCOUNTER — Inpatient Hospital Stay: Payer: Medicare Other

## 2015-04-18 DIAGNOSIS — D62 Acute posthemorrhagic anemia: Secondary | ICD-10-CM | POA: Diagnosis present

## 2015-04-18 DIAGNOSIS — E876 Hypokalemia: Secondary | ICD-10-CM | POA: Diagnosis present

## 2015-04-18 DIAGNOSIS — S32010A Wedge compression fracture of first lumbar vertebra, initial encounter for closed fracture: Secondary | ICD-10-CM | POA: Diagnosis present

## 2015-04-18 DIAGNOSIS — E860 Dehydration: Secondary | ICD-10-CM | POA: Diagnosis present

## 2015-04-18 LAB — BASIC METABOLIC PANEL
ANION GAP: 9 (ref 5–15)
BUN: 22 mg/dL — ABNORMAL HIGH (ref 6–20)
CALCIUM: 8.6 mg/dL — AB (ref 8.9–10.3)
CO2: 27 mmol/L (ref 22–32)
Chloride: 96 mmol/L — ABNORMAL LOW (ref 101–111)
Creatinine, Ser: 0.81 mg/dL (ref 0.44–1.00)
GLUCOSE: 199 mg/dL — AB (ref 65–99)
POTASSIUM: 3.1 mmol/L — AB (ref 3.5–5.1)
Sodium: 132 mmol/L — ABNORMAL LOW (ref 135–145)

## 2015-04-18 LAB — CBC
HEMATOCRIT: 32.9 % — AB (ref 35.0–47.0)
HEMOGLOBIN: 10.7 g/dL — AB (ref 12.0–16.0)
MCH: 28.6 pg (ref 26.0–34.0)
MCHC: 32.6 g/dL (ref 32.0–36.0)
MCV: 87.7 fL (ref 80.0–100.0)
Platelets: 244 10*3/uL (ref 150–440)
RBC: 3.75 MIL/uL — AB (ref 3.80–5.20)
RDW: 14.1 % (ref 11.5–14.5)
WBC: 13.8 10*3/uL — ABNORMAL HIGH (ref 3.6–11.0)

## 2015-04-18 LAB — URINALYSIS COMPLETE WITH MICROSCOPIC (ARMC ONLY)
BILIRUBIN URINE: NEGATIVE
Bacteria, UA: NONE SEEN
Glucose, UA: >500 mg/dL — AB
KETONES UR: NEGATIVE mg/dL
LEUKOCYTES UA: NEGATIVE
NITRITE: NEGATIVE
PH: 5 (ref 5.0–8.0)
PROTEIN: 100 mg/dL — AB
Specific Gravity, Urine: 1.026 (ref 1.005–1.030)

## 2015-04-18 LAB — GLUCOSE, CAPILLARY
GLUCOSE-CAPILLARY: 186 mg/dL — AB (ref 65–99)
GLUCOSE-CAPILLARY: 393 mg/dL — AB (ref 65–99)
GLUCOSE-CAPILLARY: 206 mg/dL — AB (ref 65–99)

## 2015-04-18 LAB — SURGICAL PCR SCREEN
MRSA, PCR: NEGATIVE
Staphylococcus aureus: NEGATIVE

## 2015-04-18 MED ORDER — CEFAZOLIN (ANCEF) 1 G IV SOLR
1.0000 g | INTRAVENOUS | Status: AC
  Administered 2015-04-19: 1 g
  Filled 2015-04-18 (×2): qty 1

## 2015-04-18 MED ORDER — POTASSIUM CHLORIDE IN NACL 40-0.9 MEQ/L-% IV SOLN
INTRAVENOUS | Status: DC
  Administered 2015-04-18 – 2015-04-19 (×2): 50 mL/h via INTRAVENOUS
  Filled 2015-04-18 (×2): qty 1000

## 2015-04-18 MED ORDER — POLYETHYLENE GLYCOL 3350 17 G PO PACK
17.0000 g | PACK | Freq: Every day | ORAL | Status: DC
  Administered 2015-04-18: 17 g via ORAL
  Filled 2015-04-18: qty 1

## 2015-04-18 NOTE — Progress Notes (Signed)
ANTIBIOTIC CONSULT NOTE - INITIAL  Pharmacy Consult for Vancomycin/Zosyn Indication: infected hematoma/cellulitis  Allergies  Allergen Reactions  . Alendronate Other (See Comments)    Reaction:  Leg pain   . Atorvastatin Rash  . Formaldehyde Rash    Patient Measurements: Height: 4\' 11"  (149.9 cm) Weight: 152 lb 6.4 oz (69.128 kg) IBW/kg (Calculated) : 43.2 Adjusted Body Weight: 55 kg Ht: 59 in  Vital Signs: Temp: 98.2 F (36.8 C) (10/25 0556) Temp Source: Oral (10/25 0556) BP: 121/51 mmHg (10/25 0556) Pulse Rate: 93 (10/25 0556) Intake/Output from previous day: 10/24 0701 - 10/25 0700 In: 936 [P.O.:120; I.V.:362; IV Piggyback:454] Out: -  Intake/Output from this shift:    Labs:  Recent Labs  04/17/15 1640 04/18/15 0651  WBC 18.3* 13.8*  HGB 11.8* 10.7*  PLT 298 244  CREATININE 1.32* 0.81   Estimated Creatinine Clearance: 50 mL/min (by C-G formula based on Cr of 0.81). No results for input(s): VANCOTROUGH, VANCOPEAK, VANCORANDOM, GENTTROUGH, GENTPEAK, GENTRANDOM, TOBRATROUGH, TOBRAPEAK, TOBRARND, AMIKACINPEAK, AMIKACINTROU, AMIKACIN in the last 72 hours.   Microbiology: No results found for this or any previous visit (from the past 720 hour(s)).  Medical History: Past Medical History  Diagnosis Date  . Hypertension   . Diabetes mellitus without complication (HCC)   . TIA (transient ischemic attack)     approx 15 years ago  . Vertigo     hx of  . Hyperlipemia     Medications:  Scheduled:  . docusate sodium  100 mg Oral BID  . glimepiride  2 mg Oral Q breakfast  . insulin aspart  0-24 Units Subcutaneous TID WC  . lisinopril  20 mg Oral Daily  . magnesium oxide  400 mg Oral Daily  . pantoprazole  40 mg Oral Daily  . piperacillin-tazobactam (ZOSYN)  IV  3.375 g Intravenous 3 times per day  . polyethylene glycol  17 g Oral Daily  . potassium chloride  10 mEq Oral BID  . vancomycin  750 mg Intravenous Once  . [START ON 04/19/2015] vancomycin  750 mg  Intravenous Q24H   Assessment: Patient is a 74 yo female admitted for possible infected hematoma.  MD desires empiric antibiotic coverage with Vancomycin and Zosyn.  SCr: 1.32, est CrCl~30 mL/min, ke: 0.029, t1/2: 23.9 h, Vd: 37.1 (based on adj bw) SCr 0.81 (10/25), CrCl ~ 42 ml/min, Ke 0.040, half life 17.33, Vd 38.5 L  Goal of Therapy:  Vancomycin trough goal: 10-15 (Per CT and MD note, no evidence for abscess formation)  Plan:  Will order Vancomcyin 750 mg IV once with a stacked dose orders for ~11.5 hours after.  Will then start maintenance dosing of Vancomycin 750 mg IV q24h.  Will check trough prior to dose on 10/28 at 0630 (should be at steady state).   Will start patient on Zosyn 3.375 gm IV q8h per EI protocol based on renal function.  BMP ordered in AM to assess renal function.  Pharmacy will continue to follow.   Crist FatWang, Zaliah Wissner L 04/18/2015,8:32 AM

## 2015-04-18 NOTE — Consult Note (Addendum)
L1 severe compression fracture. MRI ordered. Possible kyphoplasty tomorrow.  MRI shows only isolated L1 compression fracture. On exam the patient has severe low back pain in the affected level with tenderness to percussion. Sensation in the lower extremities is intact she is able flex extend the ankles and feet she does not recall an injury with this may have been slowly occurring over time but now is an acute problem.  Risks benefits possible complications of the procedure were discussed. Booklet and model were used to explain procedure. patient's back marked with affected level LI and initialed plan and procedure later today.

## 2015-04-18 NOTE — Consult Note (Signed)
Reason for Consult: Possible gluteal abscess. Referring Physician: Ramonita Lab, M.D.  Cheryl Mueller is an 74 y.o. female.  HPI: Notes from Dr. Caryl Comes and in person discussion with Dr. Caryl Comes. History of increasing discomfort in the left hip area. Marked leukocytosis noted on admission. Past history of many falls including recent episode when she fractured her based bar left patella. The patient could not remember a specific episode where she fell on her gluteal area, but she sustained an L1 compression fracture and patellar fracture at the same time suggesting that there was significant torsion in this area if not direct trauma.  Past Medical History  Diagnosis Date  . Hypertension   . Diabetes mellitus without complication ([place])   . TIA (transient ischemic attack)     approx 15 years ago  . Vertigo     hx of  . Hyperlipemia     Past Surgical History  Procedure Laterality Date  . Colonoscopy w/ polypectomy    . Lumbar laminectomy/decompression microdiscectomy Right 11/26/2012    Procedure: LUMBAR LAMINECTOMY/DECOMPRESSION MICRODISCECTOMY 1 LEVEL;  Surgeon: Hosie Spangle, MD;  Location: [place] [facility];  Service: Neurosurgery;  Laterality: Right;  Lumbar five-sacral one laminotomy and microdiskectomy   . Orif patella Left 03/23/2015    Procedure: OPEN REDUCTION INTERNAL (ORIF) FIXATION PATELLA;  Surgeon: Hessie Knows, MD;  Location: [facility];  Service: Orthopedics;  Laterality: Left;    Family History  Problem Relation Age of Onset  . Stroke Mother     Social History:  reports that she has never smoked. She has never used smokeless tobacco. She reports that she does not drink alcohol or use illicit drugs.  Allergies:  Allergies  Allergen Reactions  . Alendronate Other (See Comments)    Reaction:  Leg pain   . Atorvastatin Rash  . Formaldehyde Rash    Medications: I have reviewed the patient's current medications.  Results for orders placed or performed during the hospital  encounter of 04/17/15 (from the past 48 hour(s))  Comprehensive metabolic panel     Status: Abnormal   Collection Time: 04/17/15  4:40 PM  Result Value Ref Range   Sodium 130 (L) 135 - 145 mmol/L   Potassium 2.7 (LL) 3.5 - 5.1 mmol/L    Comment: CRITICAL RESULT CALLED TO, READ BACK BY AND VERIFIED WITH LAUREN HOBBS AT 1737 ON 04/17/15 BY K[facility]    Chloride 93 (L) 101 - 111 mmol/L   CO2 25 22 - 32 mmol/L   Glucose, Bld 294 (H) 65 - 99 mg/dL   BUN 24 (H) 6 - 20 mg/dL   Creatinine, Ser 1.32 (H) 0.44 - 1.00 mg/dL   Calcium 9.4 8.9 - 10.3 mg/dL   Total Protein 6.9 6.5 - 8.1 g/dL   Albumin 3.0 (L) 3.5 - 5.0 g/dL   AST 34 15 - 41 U/L   ALT 21 14 - 54 U/L   Alkaline Phosphatase 124 38 - 126 U/L   Total Bilirubin 0.4 0.3 - 1.2 mg/dL   GFR calc non Af Amer 38 (L) >60 mL/min   GFR calc Af Amer 44 (L) >60 mL/min    Comment: (NOTE) The eGFR has been calculated using the CKD EPI equation. This calculation has not been validated in all clinical situations. eGFR's persistently <60 mL/min signify possible Chronic Kidney Disease.    Anion gap 12 5 - 15  CBC WITH DIFFERENTIAL     Status: Abnormal   Collection Time: 04/17/15  4:40 PM  Result Value  Ref Range   WBC 18.3 (H) 3.6 - 11.0 K/uL   RBC 3.99 3.80 - 5.20 MIL/uL   Hemoglobin 11.8 (L) 12.0 - 16.0 g/dL   HCT 35.2 35.0 - 47.0 %   MCV 88.2 80.0 - 100.0 fL   MCH 29.6 26.0 - 34.0 pg   MCHC 33.5 32.0 - 36.0 g/dL   RDW 14.4 11.5 - 14.5 %   Platelets 298 150 - 440 K/uL   Neutrophils Relative % 90 %   Neutro Abs 16.5 (H) 1.4 - 6.5 K/uL   Lymphocytes Relative 4 %   Lymphs Abs 0.7 (L) 1.0 - 3.6 K/uL   Monocytes Relative 6 %   Monocytes Absolute 1.1 (H) 0.2 - 0.9 K/uL   Eosinophils Relative 0 %   Eosinophils Absolute 0.0 0 - 0.7 K/uL   Basophils Relative 0 %   Basophils Absolute 0.0 0 - 0.1 K/uL  Magnesium     Status: None   Collection Time: 04/17/15  4:40 PM  Result Value Ref Range   Magnesium 1.7 1.7 - 2.4 mg/dL  Glucose, capillary      Status: Abnormal   Collection Time: 04/17/15  6:38 PM  Result Value Ref Range   Glucose-Capillary 235 (H) 65 - 99 mg/dL   Comment 1 Notify RN   Glucose, capillary     Status: Abnormal   Collection Time: 04/17/15  9:28 PM  Result Value Ref Range   Glucose-Capillary 172 (H) 65 - 99 mg/dL  Basic metabolic panel     Status: Abnormal   Collection Time: 04/18/15  6:51 AM  Result Value Ref Range   Sodium 132 (L) 135 - 145 mmol/L   Potassium 3.1 (L) 3.5 - 5.1 mmol/L   Chloride 96 (L) 101 - 111 mmol/L   CO2 27 22 - 32 mmol/L   Glucose, Bld 199 (H) 65 - 99 mg/dL   BUN 22 (H) 6 - 20 mg/dL   Creatinine, Ser 0.81 0.44 - 1.00 mg/dL   Calcium 8.6 (L) 8.9 - 10.3 mg/dL   GFR calc non Af Amer >60 >60 mL/min   GFR calc Af Amer >60 >60 mL/min    Comment: (NOTE) The eGFR has been calculated using the CKD EPI equation. This calculation has not been validated in all clinical situations. eGFR's persistently <60 mL/min signify possible Chronic Kidney Disease.    Anion gap 9 5 - 15  CBC     Status: Abnormal   Collection Time: 04/18/15  6:51 AM  Result Value Ref Range   WBC 13.8 (H) 3.6 - 11.0 K/uL   RBC 3.75 (L) 3.80 - 5.20 MIL/uL   Hemoglobin 10.7 (L) 12.0 - 16.0 g/dL   HCT 32.9 (L) 35.0 - 47.0 %   MCV 87.7 80.0 - 100.0 fL   MCH 28.6 26.0 - 34.0 pg   MCHC 32.6 32.0 - 36.0 g/dL   RDW 14.1 11.5 - 14.5 %   Platelets 244 150 - 440 K/uL  Glucose, capillary     Status: Abnormal   Collection Time: 04/18/15  7:18 AM  Result Value Ref Range   Glucose-Capillary 186 (H) 65 - 99 mg/dL  Glucose, capillary     Status: Abnormal   Collection Time: 04/18/15 11:21 AM  Result Value Ref Range   Glucose-Capillary 393 (H) 65 - 99 mg/dL    Ct Abdomen Pelvis W Contrast  04/17/2015  CLINICAL DATA:  Patient with hypertension, hyperlipidemia and diabetes status post ORIF of the left patella. History of L1 compression  fracture. Small abscess on the left buttock in the emergency department. EXAM: CT ABDOMEN AND  PELVIS WITH CONTRAST TECHNIQUE: Multidetector CT imaging of the abdomen and pelvis was performed using the standard protocol following bolus administration of intravenous contrast. CONTRAST:  13m OMNIPAQUE IOHEXOL 300 MG/ML  SOLN COMPARISON:  CT abdomen pelvis 02/22/2015 FINDINGS: Lower chest: Normal heart size. Small hiatal hernia. Atelectasis within right lower lobe and lingula. No large area of pulmonary consolidation. No pleural effusion. Hepatobiliary: Liver is normal in size and contour. No focal hepatic lesions identified. Small stone in the gallbladder neck. No intrahepatic or extrahepatic biliary ductal dilatation. Pancreas: Unremarkable Spleen: Unremarkable Adrenals/Urinary Tract: The adrenal glands are normal. Kidneys enhance symmetrically with contrast. Unchanged sub cm low-attenuation lesions within the interpolar region of left kidney urinary bladder is unremarkable. Stomach/Bowel: Sigmoid colonic diverticulosis. No CT evidence for acute diverticulitis stool within the cecum and ascending colon. The appendix is normal. Oral contrast material demonstrated throughout the small and large bowel. No evidence for bowel obstruction. Normal morphology to the stomach. No free fluid or free intraperitoneal air. Vascular/Lymphatic: Normal caliber abdominal aorta. No retroperitoneal lymphadenopathy. Peripheral calcified atherosclerotic plaque. Other: The uterus is unremarkable. Musculoskeletal: There is soft tissue and subcutaneous fat stranding involving fat overlying the left gluteal musculature. No evidence for abscess formation. Lumbar spine degenerative changes. There is an acute compression fracture of L1 vertebral body with approximately 50-60% height loss. There is retropulsion of osseous fragments into spinal canal with approximate 50% narrowing. IMPRESSION: 1. Acute compression deformity of the L1 vertebral body with retropulsion of osseous fragments causing approximately 50% narrowing of the spinal  canal at this level. 2. There is stranding throughout the subcutaneous fat overlying the left gluteal musculature which may be secondary to asymmetric edema, cellulitis or hematoma formation. No evidence for abscess formation. Electronically Signed   By: DLovey NewcomerM.D.   On: 04/17/2015 20:14    ROS Blood pressure 112/50, pulse 93, temperature 99.8 F (37.7 C), temperature source Oral, resp. rate 17, height 4' 11"  (1.499 m), weight 152 lb 6.4 oz (69.128 kg), SpO2 93 %. Physical Exam  Musculoskeletal:       Back:    Assessment/Plan: CT of 02/15/2015 reviewed. No abscess. Increased soft tissue stranding consistent with inflammation but no abscess. Focal 4+ or minus centimeter area of increased density in the area corresponding to the superficial abrasion.  No clear evidence of necrotizing cellulitis. Likely traumatized tissue from the patient's recent falls with possible secondary infection.  Responding to IV antibiotics.  No indication for surgical intervention at this time. Will follow the patient along with you.   BRobert Bellow10/25/2016, 1:48 PM

## 2015-04-18 NOTE — Progress Notes (Signed)
Patient ID: Cheryl Mueller, female   DOB: January 12, 1941, 74 y.o.   MRN: [id number] SUBJECTIVE:  Admitted with progressive back/buttock pain, worsening ability to ambulate; found to have large hematoma left buttock with superficial cellulitis and concern about abscess.  Labs revealed significant leukocytosis as well as profound hypokalemia, dehydration.  Pt no longer able to ambulate at home; had recently had left ORIF patella, complicated by fall with L1 compression fracture.  Pain in buttock/back about the same.  Nurses report pt not able to ambulate at all due to pain. No fever, chills.  Had been having nausea; this is improved and feels like she could eat.  CT yesterday confirms L1 compression fxr, with retropulsed fragments.  Area in the buttock does not appear to be abscess, fortunately.  ______________________________________________________________________  ROS: Please see HPI; remainder of complete 10 point ROS is negative   Past Medical History  Diagnosis Date  . Hypertension   . Diabetes mellitus without complication (HCC)   . TIA (transient ischemic attack)     approx 15 years ago  . Vertigo     hx of  . Hyperlipemia     Past Surgical History  Procedure Laterality Date  . Colonoscopy w/ polypectomy    . Lumbar laminectomy/decompression microdiscectomy Right 11/26/2012    Procedure: LUMBAR LAMINECTOMY/DECOMPRESSION MICRODISCECTOMY 1 LEVEL;  Surgeon: Hewitt Shortsobert W Nudelman, MD;  Location: [facility];  Service: Neurosurgery;  Laterality: Right;  Lumbar five-sacral one laminotomy and microdiskectomy   . Orif patella Left 03/23/2015    Procedure: OPEN REDUCTION INTERNAL (ORIF) FIXATION PATELLA;  Surgeon: Kennedy BuckerMichael Menz, MD;  Location: [facility] ORS;  Service: Orthopedics;  Laterality: Left;     Current facility-administered medications:  .  0.9 % NaCl with KCl 40 mEq / L  infusion, , Intravenous, Continuous, Curtis SitesBert J Klein III, MD .  acetaminophen (TYLENOL) tablet 650 mg, 650 mg, Oral, Q6H PRN  **OR** acetaminophen (TYLENOL) suppository 650 mg, 650 mg, Rectal, Q6H PRN, Curtis SitesBert J Klein III, MD .  docusate sodium (COLACE) capsule 100 mg, 100 mg, Oral, BID, Curtis SitesBert J Klein III, MD, 100 mg at 04/17/15 2141 .  glimepiride (AMARYL) tablet 2 mg, 2 mg, Oral, Q breakfast, Curtis SitesBert J Klein III, MD .  HYDROcodone-acetaminophen (NORCO/VICODIN) 5-325 MG per tablet 1-2 tablet, 1-2 tablet, Oral, Q4H PRN, Curtis SitesBert J Klein III, MD, 2 tablet at 04/18/15 0555 .  insulin aspart (novoLOG) injection 0-24 Units, 0-24 Units, Subcutaneous, TID WC, Lynnea FerrierBert J Klein III, MD, 8 Units at 04/17/15 1841 .  lisinopril (PRINIVIL,ZESTRIL) tablet 20 mg, 20 mg, Oral, Daily, Curtis SitesBert J Klein III, MD, 20 mg at 04/17/15 1906 .  magnesium oxide (MAG-OX) tablet 400 mg, 400 mg, Oral, Daily, Curtis SitesBert J Klein III, MD, 400 mg at 04/17/15 2011 .  morphine 2 MG/ML injection 2 mg, 2 mg, Intravenous, Q2H PRN, Curtis SitesBert J Klein III, MD, 2 mg at 04/18/15 0026 .  ondansetron (ZOFRAN) tablet 4 mg, 4 mg, Oral, Q6H PRN **OR** ondansetron (ZOFRAN) injection 4 mg, 4 mg, Intravenous, Q6H PRN, Curtis SitesBert J Klein III, MD, 4 mg at 04/17/15 1755 .  pantoprazole (PROTONIX) EC tablet 40 mg, 40 mg, Oral, Daily, Curtis SitesBert J Klein III, MD, 40 mg at 04/17/15 1843 .  piperacillin-tazobactam (ZOSYN) IVPB 3.375 g, 3.375 g, Intravenous, 3 times per day, Sherry RuffingCrystal G Scarpena, RPH, 3.375 g at 04/17/15 2141 .  potassium chloride (K-DUR,KLOR-CON) CR tablet 10 mEq, 10 mEq, Oral, BID, Curtis SitesBert J Klein III, MD, 10 mEq at 04/17/15 2011 .  traZODone (DESYREL)  tablet 25 mg, 25 mg, Oral, QHS PRN, Curtis Sites III, MD .  vancomycin (VANCOCIN) IVPB 750 mg/150 ml premix, 750 mg, Intravenous, Once, [employer], RPH .  [START ON 04/19/2015] vancomycin (VANCOCIN) IVPB 750 mg/150 ml premix, 750 mg, Intravenous, Q24H, Crystal G Scarpena, RPH  PHYSICAL EXAM:  BP 121/51 mmHg  Pulse 93  Temp(Src) 98.2 F (36.8 C) (Oral)  Resp 17  Ht  (1.499 m)  Wt 69.128 kg (152 lb 6.4 oz)  BMI 30.76 kg/m2  SpO2  91%  General: Well developed, well nourished female, appears uncomfortable HEENT: PERRL; OP slightly dry without lesions. Neck: supple, trachea midline, no thyromegaly Chest: normal to palpation Lungs: clear bilaterally without retractions or wheezes Cardiovascular: RRR, 2/6 murmur, no gallop; distal pulses 2+ Abdomen: soft, nontender, nondistended, positive bowel sounds Extremities: no clubbing, cyanosis, edema.  Brace left knee.  Thickening throughout left buttock c/w hematoma Neuro: alert, moves all extremities, though limited by pain in left leg Derm: abrasion/ruptured abscess left buttock; improved erythema left buttock Lymph: no cervical or supraclavicular lymphadenopathy  Labs and imaging studies were reviewed  ASSESSMENT/PLAN:   1. Cellulitis/hematoma- on broad spectrum abx, given decline, increased pain, leukocytosis, and recent hospitalization. Await surgery thoughts; superficial exam and WBC improved, and consider change to PO abx, dependent on surgery thoughts 2. Recent ORIF patella/L1 comp fracture- will ask Ortho to see.  If no plans for procedure/contraindication from general surgery, needs to be on anticoagulation due to high risk of DVT 3. Hypokalemia/dehydration- improved; correcting this and hypomagnesemia.  D/c tele 4. Dm- uncontrolled; will need to hold metformin for another 24 hrs given IV dye. Cover with SSI 5. HTN- adequately controlled; reduced home regimen due to hypotension 6. Anemia- likely with element of acute blood loss due to hematoma; following CBC 7. D/c plan- will need to see how she does with mobility once hematoma/compression fracture have been further evaluated.  May need STR.

## 2015-04-18 NOTE — Clinical Documentation Improvement (Addendum)
Internal Medicine  (if you agree with a listed condition, please document your response in the progress notes and discharge summary, not on the query form itself in [facility])  Possible Clinical Conditions:  - Acute Kidney Injury (AKI), resolved  - Other Condition  - Unable to clinically determine  Clinical Information: Creatinine has improved by 0.51 mg/dl within 24 hours of admission. BUN, Creat/GFR trend this admission   (white female) Component     Latest Ref Rng 04/17/2015 04/18/2015  BUN     6 - 20 mg/dL 24 (H) 22 (H)  Creatinine     0.44 - 1.00 mg/dL 1.32 (H) 0.81  EGFR (Non-African Amer.)     >60 mL/min 38 (L) >60   Please exercise your independent, professional judgment when responding. A specific answer is not anticipated or expected.   Thank You, Erling Conte  RN BSN CCDS [phone] Health Information Management [place]

## 2015-04-19 ENCOUNTER — Encounter
Admission: AD | Disposition: A | Discharge status: Skilled Nursing Facility | Payer: Self-pay | Source: Ambulatory Visit | Attending: Internal Medicine

## 2015-04-19 ENCOUNTER — Encounter: Payer: Self-pay | Admitting: Orthopedic Surgery

## 2015-04-19 ENCOUNTER — Inpatient Hospital Stay: Payer: Medicare Other | Admitting: Certified Registered"

## 2015-04-19 ENCOUNTER — Inpatient Hospital Stay: Payer: Medicare Other

## 2015-04-19 HISTORY — PX: KYPHOPLASTY: SHX5884

## 2015-04-19 LAB — RETICULOCYTES
RBC.: 3.23 MIL/uL — AB (ref 3.80–5.20)
RETIC CT PCT: 0.9 % (ref 0.4–3.1)
Retic Count, Absolute: 29.1 10*3/uL (ref 19.0–183.0)

## 2015-04-19 LAB — GLUCOSE, CAPILLARY
GLUCOSE-CAPILLARY: 200 mg/dL — AB (ref 65–99)
GLUCOSE-CAPILLARY: 194 mg/dL — AB (ref 65–99)
Glucose-Capillary: 213 mg/dL — ABNORMAL HIGH (ref 65–99)
Glucose-Capillary: 207 mg/dL — ABNORMAL HIGH (ref 65–99)

## 2015-04-19 LAB — BASIC METABOLIC PANEL
ANION GAP: 6 (ref 5–15)
BUN: 15 mg/dL (ref 6–20)
CALCIUM: 8.1 mg/dL — AB (ref 8.9–10.3)
CO2: 27 mmol/L (ref 22–32)
CREATININE: 0.51 mg/dL (ref 0.44–1.00)
Chloride: 103 mmol/L (ref 101–111)
GFR calc non Af Amer: >60 mL/min (ref >60)
Glucose, Bld: 174 mg/dL — ABNORMAL HIGH (ref 65–99)
Potassium: 3.3 mmol/L — ABNORMAL LOW (ref 3.5–5.1)
SODIUM: 136 mmol/L (ref 135–145)

## 2015-04-19 LAB — IRON AND TIBC
IRON: 13 ug/dL — AB (ref 28–170)
Saturation Ratios: 11 % (ref 10.4–31.8)
TIBC: 116 ug/dL — ABNORMAL LOW (ref 250–450)
UIBC: 103 ug/dL

## 2015-04-19 LAB — CBC
HEMATOCRIT: 28.9 % — AB (ref 35.0–47.0)
HEMOGLOBIN: 9.8 g/dL — AB (ref 12.0–16.0)
MCH: 29.7 pg (ref 26.0–34.0)
MCHC: 33.8 g/dL (ref 32.0–36.0)
MCV: 87.8 fL (ref 80.0–100.0)
Platelets: 252 10*3/uL (ref 150–440)
RBC: 3.29 MIL/uL — ABNORMAL LOW (ref 3.80–5.20)
RDW: 14 % (ref 11.5–14.5)
WBC: 12.7 10*3/uL — ABNORMAL HIGH (ref 3.6–11.0)

## 2015-04-19 LAB — FERRITIN: Ferritin: 272 ng/mL (ref 11–307)

## 2015-04-19 LAB — VITAMIN B12: VITAMIN B 12: 2466 pg/mL — AB (ref 180–914)

## 2015-04-19 LAB — FOLATE

## 2015-04-19 SURGERY — KYPHOPLASTY
Anesthesia: Monitor Anesthesia Care

## 2015-04-19 MED ORDER — BUPIVACAINE-EPINEPHRINE (PF) 0.5% -1:200000 IJ SOLN
INTRAMUSCULAR | Status: DC | PRN
  Administered 2015-04-19: 20 mL

## 2015-04-19 MED ORDER — SULFAMETHOXAZOLE-TRIMETHOPRIM 800-160 MG PO TABS
1.0000 | ORAL_TABLET | Freq: Two times a day (BID) | ORAL | Status: DC
  Administered 2015-04-19 – 2015-04-21 (×5): 1 via ORAL
  Filled 2015-04-19 (×5): qty 1

## 2015-04-19 MED ORDER — CEPHALEXIN 500 MG PO CAPS
500.0000 mg | ORAL_CAPSULE | Freq: Three times a day (TID) | ORAL | Status: DC
  Administered 2015-04-19 – 2015-04-21 (×7): 500 mg via ORAL
  Filled 2015-04-19 (×7): qty 1

## 2015-04-19 MED ORDER — CALCIUM CARBONATE-VITAMIN D 500-200 MG-UNIT PO TABS
1.0000 | ORAL_TABLET | Freq: Two times a day (BID) | ORAL | Status: DC
  Administered 2015-04-19 – 2015-04-21 (×5): 1 via ORAL
  Filled 2015-04-19 (×5): qty 1

## 2015-04-19 MED ORDER — KETAMINE HCL 50 MG/ML IJ SOLN
INTRAMUSCULAR | Status: DC | PRN
  Administered 2015-04-19: 25 mg via INTRAVENOUS

## 2015-04-19 MED ORDER — METFORMIN HCL 500 MG PO TABS
500.0000 mg | ORAL_TABLET | Freq: Two times a day (BID) | ORAL | Status: DC
  Administered 2015-04-19 – 2015-04-21 (×4): 500 mg via ORAL
  Filled 2015-04-19 (×4): qty 1

## 2015-04-19 MED ORDER — GLIMEPIRIDE 2 MG PO TABS
4.0000 mg | ORAL_TABLET | Freq: Every day | ORAL | Status: DC
  Administered 2015-04-20 – 2015-04-21 (×2): 4 mg via ORAL
  Filled 2015-04-19 (×2): qty 2

## 2015-04-19 MED ORDER — POTASSIUM CHLORIDE IN NACL 40-0.9 MEQ/L-% IV SOLN
INTRAVENOUS | Status: DC
  Administered 2015-04-19: 50 mL/h via INTRAVENOUS
  Filled 2015-04-19 (×2): qty 1000

## 2015-04-19 MED ORDER — LIDOCAINE HCL (CARDIAC) 20 MG/ML IV SOLN
INTRAVENOUS | Status: DC | PRN
  Administered 2015-04-19: 50 mg via INTRAVENOUS

## 2015-04-19 MED ORDER — FENTANYL CITRATE (PF) 100 MCG/2ML IJ SOLN
INTRAMUSCULAR | Status: DC | PRN
  Administered 2015-04-19 (×2): 50 ug via INTRAVENOUS

## 2015-04-19 MED ORDER — ONDANSETRON HCL 4 MG/2ML IJ SOLN: 4.0000 mg | Freq: Once | INTRAMUSCULAR | Status: DC | PRN

## 2015-04-19 MED ORDER — LIDOCAINE HCL 1 % IJ SOLN
INTRAMUSCULAR | Status: DC | PRN
  Administered 2015-04-19: 10 mL

## 2015-04-19 MED ORDER — MIDAZOLAM HCL 5 MG/5ML IJ SOLN
INTRAMUSCULAR | Status: DC | PRN
  Administered 2015-04-19: 1 mg via INTRAVENOUS

## 2015-04-19 MED ORDER — ATORVASTATIN CALCIUM 20 MG PO TABS
80.0000 mg | ORAL_TABLET | Freq: Every day | ORAL | Status: DC
Start: 2015-04-19 — End: 2015-04-21
  Administered 2015-04-19 – 2015-04-20 (×2): 80 mg via ORAL
  Filled 2015-04-19 (×2): qty 4

## 2015-04-19 MED ORDER — PROPOFOL 500 MG/50ML IV EMUL
INTRAVENOUS | Status: DC | PRN
  Administered 2015-04-19: 50 ug/kg/min via INTRAVENOUS

## 2015-04-19 MED ORDER — ASPIRIN EC 81 MG PO TBEC
81.0000 mg | DELAYED_RELEASE_TABLET | Freq: Every day | ORAL | Status: DC
  Administered 2015-04-19 – 2015-04-21 (×3): 81 mg via ORAL
  Filled 2015-04-19 (×3): qty 1

## 2015-04-19 MED ORDER — FENTANYL CITRATE (PF) 100 MCG/2ML IJ SOLN: 25.0000 ug | INTRAMUSCULAR | Status: DC | PRN

## 2015-04-19 MED ORDER — LISINOPRIL 20 MG PO TABS
40.0000 mg | ORAL_TABLET | Freq: Every day | ORAL | Status: DC
  Administered 2015-04-19 – 2015-04-21 (×3): 40 mg via ORAL
  Filled 2015-04-19 (×3): qty 2

## 2015-04-19 MED ORDER — PROPOFOL 10 MG/ML IV BOLUS
INTRAVENOUS | Status: DC | PRN
  Administered 2015-04-19: 40 mg via INTRAVENOUS

## 2015-04-19 MED ORDER — LACTATED RINGERS IV SOLN
INTRAVENOUS | Status: DC | PRN
  Administered 2015-04-19: 12:00:00 via INTRAVENOUS

## 2015-04-19 MED ORDER — HEPARIN SODIUM (PORCINE) 5000 UNIT/ML IJ SOLN: 5000.0000 [IU] | Freq: Two times a day (BID) | INTRAMUSCULAR | Status: DC

## 2015-04-19 SURGICAL SUPPLY — 14 items
BNDG ADH 2 X3.75 FABRIC TAN LF (GAUZE/BANDAGES/DRESSINGS) ×3 IMPLANT
CEMENT KYPHON CX01A KIT/MIXER (Cement) ×3 IMPLANT
DEVICE BIOPSY BONE KYPHX (INSTRUMENTS) ×3 IMPLANT
DRAPE C-ARM XRAY 36X54 (DRAPES) ×3 IMPLANT
DURAPREP 26ML APPLICATOR (WOUND CARE) ×3 IMPLANT
GLOVE SURG ORTHO 9.0 STRL STRW (GLOVE) ×3 IMPLANT
GOWN SPECIALTY ULTRA XL (MISCELLANEOUS) ×3 IMPLANT
GOWN STRL REUS W/ TWL LRG LVL3 (GOWN DISPOSABLE) ×1 IMPLANT
GOWN STRL REUS W/TWL LRG LVL3 (GOWN DISPOSABLE) ×2
LIQUID BAND (GAUZE/BANDAGES/DRESSINGS) ×3 IMPLANT
PACK KYPHOPLASTY (MISCELLANEOUS) ×3 IMPLANT
STRAP SAFETY BODY (MISCELLANEOUS) ×3 IMPLANT
TRAY KYPHOPAK 15/3 EXPRESS 1ST (MISCELLANEOUS) ×3 IMPLANT
TRAY KYPHOPAK 20/3 EXPRESS 1ST (MISCELLANEOUS) IMPLANT

## 2015-04-19 NOTE — Anesthesia Preprocedure Evaluation (Addendum)
Anesthesia Evaluation  Patient identified by MRN, date of birth, ID band Patient awake    Reviewed: Allergy & Precautions, NPO status , Patient's Chart, lab work & pertinent test results  Airway Mallampati: III       Dental no notable dental hx.    Pulmonary neg pulmonary ROS,    Pulmonary exam normal        Cardiovascular hypertension, Pt. on medications Normal cardiovascular exam     Neuro/Psych TIA   GI/Hepatic negative GI ROS, Neg liver ROS,   Endo/Other  diabetes, Type 2, Oral Hypoglycemic Agents  Renal/GU      Musculoskeletal   Abdominal Normal abdominal exam  (+)   Peds  Hematology  (+) anemia ,   Anesthesia Other Findings   Reproductive/Obstetrics                            Anesthesia Physical Anesthesia Plan  ASA: III  Anesthesia Plan: MAC   Post-op Pain Management:    Induction: Intravenous  Airway Management Planned: Nasal Cannula  Additional Equipment:   Intra-op Plan:   Post-operative Plan:   Informed Consent: I have reviewed the patients History and Physical, chart, labs and discussed the procedure including the risks, benefits and alternatives for the proposed anesthesia with the patient or authorized representative who has indicated his/her understanding and acceptance.     Plan Discussed with: CRNA  Anesthesia Plan Comments:         Anesthesia Quick Evaluation

## 2015-04-19 NOTE — Progress Notes (Signed)
Patient ID: Cheryl Mueller, female   DOB: 05-19-1941, 74 y.o.   MRN: [id number] SUBJECTIVE:  Admitted with progressive back/buttock pain, worsening ability to ambulate; found to have large hematoma left buttock with superficial cellulitis and concern about abscess.  Labs revealed significant leukocytosis as well as profound hypokalemia, dehydration.  Pt no longer able to ambulate at home; had recently had left ORIF patella, complicated by fall with L1 compression fracture. Pain has been stable overnight; had MRI which reveals L1 compression fracture at about 90%, and ortho feels this contributes significantly to her back pain.  No fever,chills overnight.  BP rising as expected as her hydration status improves.  Glucose uncontrolled as oral intake better.  ______________________________________________________________________  ROS: Please see HPI; remainder of complete 10 point ROS is negative   Past Medical History  Diagnosis Date  . Hypertension   . Diabetes mellitus without complication ([place])   . TIA (transient ischemic attack)     approx 15 years ago  . Vertigo     hx of  . Hyperlipemia     Past Surgical History  Procedure Laterality Date  . Colonoscopy w/ polypectomy    . Lumbar laminectomy/decompression microdiscectomy Right 11/26/2012    Procedure: LUMBAR LAMINECTOMY/DECOMPRESSION MICRODISCECTOMY 1 LEVEL;  Surgeon: Hosie Spangle, MD;  Location: [place] [facility];  Service: Neurosurgery;  Laterality: Right;  Lumbar five-sacral one laminotomy and microdiskectomy   . Orif patella Left 03/23/2015    Procedure: OPEN REDUCTION INTERNAL (ORIF) FIXATION PATELLA;  Surgeon: Hessie Knows, MD;  Location: [facility] ORS;  Service: Orthopedics;  Laterality: Left;     Current facility-administered medications:  .  acetaminophen (TYLENOL) tablet 650 mg, 650 mg, Oral, Q6H PRN **OR** acetaminophen (TYLENOL) suppository 650 mg, 650 mg, Rectal, Q6H PRN, Tama High III, MD .  ceFAZolin (ANCEF) powder 1 g,  1 g, Other, To OR, Hessie Knows, MD .  cephALEXin (KEFLEX) capsule 500 mg, 500 mg, Oral, 3 times per day, Tama High III, MD .  docusate sodium (COLACE) capsule 100 mg, 100 mg, Oral, BID, Tama High III, MD, 100 mg at 04/18/15 2209 .  glimepiride (AMARYL) tablet 4 mg, 4 mg, Oral, Q breakfast, Tama High III, MD .  HYDROcodone-acetaminophen (NORCO/VICODIN) 5-325 MG per tablet 1-2 tablet, 1-2 tablet, Oral, Q4H PRN, Adin Hector, MD, 2 tablet at 04/18/15 2209 .  insulin aspart (novoLOG) injection 0-24 Units, 0-24 Units, Subcutaneous, TID WC, Tama High III, MD, 8 Units at 04/18/15 1658 .  lisinopril (PRINIVIL,ZESTRIL) tablet 40 mg, 40 mg, Oral, Daily, Tama High III, MD .  magnesium oxide (MAG-OX) tablet 400 mg, 400 mg, Oral, Daily, Tama High III, MD, 400 mg at 04/18/15 1018 .  metFORMIN (GLUCOPHAGE) tablet 500 mg, 500 mg, Oral, BID WC, Tama High III, MD .  morphine 2 MG/ML injection 2 mg, 2 mg, Intravenous, Q2H PRN, Tama High III, MD, 2 mg at 04/19/15 0217 .  ondansetron (ZOFRAN) tablet 4 mg, 4 mg, Oral, Q6H PRN **OR** ondansetron (ZOFRAN) injection 4 mg, 4 mg, Intravenous, Q6H PRN, Tama High III, MD, 4 mg at 04/17/15 1755 .  pantoprazole (PROTONIX) EC tablet 40 mg, 40 mg, Oral, Daily, Tama High III, MD, 40 mg at 04/18/15 1018 .  polyethylene glycol (MIRALAX / GLYCOLAX) packet 17 g, 17 g, Oral, Daily, Tama High III, MD, 17 g at 04/18/15 1017 .  potassium chloride (K-DUR,KLOR-CON) CR tablet 10 mEq, 10 mEq, Oral, BID, Eustace Moore  Briscoe Burns III, MD, 10 mEq at 04/18/15 2209 .  sulfamethoxazole-trimethoprim (BACTRIM DS,SEPTRA DS) 800-160 MG per tablet 1 tablet, 1 tablet, Oral, Q12H, Tama High III, MD .  traZODone (DESYREL) tablet 25 mg, 25 mg, Oral, QHS PRN, Tama High III, MD, 25 mg at 04/18/15 2210  PHYSICAL EXAM:  BP 158/57 mmHg  Pulse 95  Temp(Src) 98.3 F (36.8 C) (Oral)  Resp 18  Ht 4' 11"  (1.499 m)  Wt 69.128 kg (152 lb 6.4 oz)  BMI 30.76 kg/m2  SpO2  93%  General: Well developed, well nourished female, in NAD HEENT: PERRL; OP slightly dry without lesions. Neck: supple, trachea midline, no thyromegaly Chest: normal to palpation Lungs: clear bilaterally without retractions or wheezes Cardiovascular: RRR, 2/6 murmur, no gallop; distal pulses 2+ Abdomen: soft, nontender, nondistended, positive bowel sounds Extremities: no clubbing, cyanosis, edema.  Brace left knee.  left buttock hematoma Neuro: alert, moves all extremities, though limited by pain in left leg Derm: residual erythematous papule left buttock; redness improved. Lymph: no cervical or supraclavicular lymphadenopathy  Labs and imaging studies were reviewed  ASSESSMENT/PLAN:   1. Cellulitis/hematoma- appreciate surgery input; no debridement needed and clinical exam, labs appear much improved.  Will convert to keflex, bactrim and monitor progress 2. Recent ORIF patella/L1 comp fracture- appreciate ortho input; MRI shows significant L1 compression fracture, with plan to proceed to kyphoplasty around noon today.  Discussed with pt, family, nursing, and Orthopedics.  Hold heparin/anticoagulation until after procedure 3. Hypokalemia/dehydration/acute kidney injury- renal function back to normal; cont to correct K+.  Follow met b 4. Dm- uncontrolled; increase amaryl and restart metformin once pt no longer NPO. Cover with SSI 5. HTN- climbing as expected as hydration improved; increase lisinopril back to home dose, which should help hypokalemia as well.  Consider restarting amlodipine, depending on BP readings. 6. Anemia- likely with element of acute blood loss due to hematoma, but continues to drift down; following CBC.  Check iron, ferritin, retic, b12, folate, neurontin for other sources 7. D/c plan- will need to see how she does with mobility once compression fracture addressed.  May need STR.  Pt/family aware.

## 2015-04-19 NOTE — Care Management Important Message (Signed)
Important Message  Patient Details  Name: Cheryl Mueller MRN: [id number] Date of Birth: [birth year]   Medicare Important Message Given:  Yes-second notification given    Verita SchneidersKathy A Allmond 04/19/2015, 12:35 PM

## 2015-04-19 NOTE — Anesthesia Postprocedure Evaluation (Signed)
  Anesthesia Post-op Note  Patient: Cheryl SchoonerAnn C Hibberd  Procedure(s) Performed: Procedure(s): KYPHOPLASTY L 1 (N/A)  Anesthesia type:MAC  Patient location: PACU  Post pain: Pain level controlled  Post assessment: Post-op Vital signs reviewed, Patient's Cardiovascular Status Stable, Respiratory Function Stable, Patent Airway and No signs of Nausea or vomiting  Post vital signs: Reviewed and stable  Last Vitals:  Filed Vitals:   04/19/15 1356  BP: 131/54  Pulse: 92  Temp: 37.1 C  Resp: 18    Level of consciousness: awake, alert  and patient cooperative  Complications: No apparent anesthesia complications

## 2015-04-19 NOTE — Op Note (Signed)
04/17/2015 - 04/19/2015  1:09 PM  PATIENT:  Cheryl Mueller  74 y.o. female  PRE-OPERATIVE DIAGNOSIS:  L1 COMPRESSION FRACTURE  POST-OPERATIVE DIAGNOSIS:  L 1 compression fracture  PROCEDURE:  Procedure(s): KYPHOPLASTY L 1 (N/A)  SURGEON: Leitha SchullerMichael J Viktor Philipp, MD  ASSISTANTS: None  ANESTHESIA:   local and MAC  EBL:  Total I/O In: 500 [I.V.:500] Out: -   BLOOD ADMINISTERED:none  DRAINS: none   LOCAL MEDICATIONS USED:  MARCAINE    and LIDOCAINE   SPECIMEN:  Source of Specimen:  L1  DISPOSITION OF SPECIMEN:  PATHOLOGY  COUNTS:  YES  TOURNIQUET:  * No tourniquets in log *  IMPLANTS: Bone cement  DICTATION: .Dragon Dictation patient brought the operating room and after adequate sedation was obtained, patient placed prone. C-arm views were C-arm was brought in and AP lateral projections and good visualization of L1 was obtained. Patient identification timeout procedures were completed and the skin was prepped with alcohol and 5 cc 1% Xylocaine infiltrated subcutaneous case and both sides. Following this the back was prepped and draped in usual sterile fashion and repeat timeout procedure completed. Spinal needle was used to get down to the pedicle on each side with a 50-50 mix of 1% Xylocaine and have percent Sensorcaine with epinephrine. After allowing this to set a small incision was made on the right side and a pedicular approach was made into the vertebral body template was made at biopsy but no biopsy material was obtained and drilling was carried out followed by placement of a balloon inflation about 2-1/2 cc with partial correction of deformity. The left side was approached in identical fashion with biopsy obtained on the left side. The balloons were slowly inflated to about 3 to have cc on each side getting up proximally 50% of the height restored. The the cement was mixed and when it is appropriate consistency the vertebral body was filled with approximately 6 cc of bone cement  getting partial correction and good fill of the vertebral body with about a significant extravasation. The cement was set well enough the trochars removed permanent C-arm views obtained. Wounds were closed with Dermabond and covered with Band-Aids. Patient center comes stable condition  PLAN OF CARE: Continue as inpatient  PATIENT DISPOSITION:  PACU - hemodynamically stable.

## 2015-04-19 NOTE — Progress Notes (Signed)
Patient seen in the PACU s/p kyphoplasty.  Area of redness decreased by approximately 2-3 cm. Tenderness difficult to assess as patient is post op.  Reports severe back pain much improved. Less tenderness at left gluteal area. Less edema, less redness. Appears to be resolving inflammatory process in the left gluteal area. No evidence of abscess at this time. Patient could likely go home on oral antibiotics and follow up as an outpatient when stable from kypoplasty.  Dr. Evette CristalSankar will be available after today for assessment if needed.

## 2015-04-19 NOTE — Transfer of Care (Signed)
Immediate Anesthesia Transfer of Care Note  Patient: Cheryl Mueller  Procedure(s) Performed: Procedure(s): KYPHOPLASTY L 1 (N/A)  Patient Location: PACU  Anesthesia Type:MAC  Level of Consciousness: awake and alert   Airway & Oxygen Therapy: Patient Spontanous Breathing  Post-op Assessment: Report given to RN  Post vital signs: Reviewed  Last Vitals:  Filed Vitals:   04/19/15 1259  BP: 136/67  Pulse: 95  Temp: 36.1 C  Resp: 14    Complications: No apparent anesthesia complications

## 2015-04-20 ENCOUNTER — Encounter
Admission: RE | Admit: 2015-04-20 | Discharge: 2015-04-20 | Disposition: A | Discharge status: Home / Self Care | Payer: Medicare Other | Source: Intra-hospital | Attending: Internal Medicine | Admitting: Internal Medicine

## 2015-04-20 DIAGNOSIS — Z794 Long term (current) use of insulin: Secondary | ICD-10-CM | POA: Insufficient documentation

## 2015-04-20 DIAGNOSIS — E119 Type 2 diabetes mellitus without complications: Secondary | ICD-10-CM | POA: Insufficient documentation

## 2015-04-20 DIAGNOSIS — N179 Acute kidney failure, unspecified: Secondary | ICD-10-CM | POA: Diagnosis present

## 2015-04-20 LAB — GLUCOSE, CAPILLARY
GLUCOSE-CAPILLARY: 174 mg/dL — AB (ref 65–99)
GLUCOSE-CAPILLARY: 218 mg/dL — AB (ref 65–99)
GLUCOSE-CAPILLARY: 290 mg/dL — AB (ref 65–99)
Glucose-Capillary: 189 mg/dL — ABNORMAL HIGH (ref 65–99)
Glucose-Capillary: 168 mg/dL — ABNORMAL HIGH (ref 65–99)

## 2015-04-20 LAB — BASIC METABOLIC PANEL
Anion gap: 3 — ABNORMAL LOW (ref 5–15)
BUN: 9 mg/dL (ref 6–20)
CALCIUM: 7.7 mg/dL — AB (ref 8.9–10.3)
CO2: 27 mmol/L (ref 22–32)
CREATININE: 0.45 mg/dL (ref 0.44–1.00)
Chloride: 108 mmol/L (ref 101–111)
GFR calc non Af Amer: >60 mL/min (ref >60)
Glucose, Bld: 133 mg/dL — ABNORMAL HIGH (ref 65–99)
Potassium: 3.8 mmol/L (ref 3.5–5.1)
Sodium: 138 mmol/L (ref 135–145)

## 2015-04-20 LAB — CBC
HCT: 30.1 % — ABNORMAL LOW (ref 35.0–47.0)
Hemoglobin: 9.8 g/dL — ABNORMAL LOW (ref 12.0–16.0)
MCH: 28.6 pg (ref 26.0–34.0)
MCHC: 32.6 g/dL (ref 32.0–36.0)
MCV: 87.6 fL (ref 80.0–100.0)
PLATELETS: 256 10*3/uL (ref 150–440)
RBC: 3.43 MIL/uL — AB (ref 3.80–5.20)
RDW: 14.4 % (ref 11.5–14.5)
WBC: 7 10*3/uL (ref 3.6–11.0)

## 2015-04-20 LAB — SURGICAL PATHOLOGY

## 2015-04-20 MED ORDER — HEPARIN SODIUM (PORCINE) 5000 UNIT/ML IJ SOLN
5000.0000 [IU] | Freq: Two times a day (BID) | INTRAMUSCULAR | Status: DC
  Administered 2015-04-20 – 2015-04-21 (×3): 5000 [IU] via SUBCUTANEOUS
  Filled 2015-04-20 (×3): qty 1

## 2015-04-20 NOTE — Clinical Social Work Placement (Signed)
   CLINICAL SOCIAL WORK PLACEMENT  NOTE  Date:  04/20/2015  Patient Details  Name: Cheryl Mueller MRN: [id number] Date of Birth: 28-Oct-1940  Clinical Social Work is seeking post-discharge placement for this patient at the Skilled  Nursing Facility level of care (*CSW will initial, date and re-position this form in  chart as items are completed):  Yes   Patient/family provided with [facility] Clinical Social Work Department's list of facilities offering this level of care within the geographic area requested by the patient (or if unable, by the patient's family).  Yes   Patient/family informed of their freedom to choose among providers that offer the needed level of care, that participate in Medicare, Medicaid or managed care program needed by the patient, have an available bed and are willing to accept the patient.  Yes   Patient/family informed of [facility]'s ownership interest in [facility] and [facility], as well as of the fact that they are under no obligation to receive care at these facilities.  PASRR submitted to EDS on 04/20/15     PASRR number received on 04/20/15     Existing PASRR number confirmed on       FL2 transmitted to all facilities in geographic area requested by pt/family on 04/20/15     FL2 transmitted to all facilities within larger geographic area on       Patient informed that his/her managed care company has contracts with or will negotiate with certain facilities, including the following:        Yes   Patient/family informed of bed offers received.  [facility]     Physician recommends and patient chooses bed at      [facility]  .  Patient to be transferred to facility by       Patient family notified on   of transfer.  Name of family member notified:        PHYSICIAN Please sign FL2, Please prepare prescriptions     Additional Comment:     _______________________________________________ Ned Cardara N Ebunoluwa Gernert, LCSW 04/20/2015, 4:44 PM

## 2015-04-20 NOTE — [place] FL2 (Addendum)
[place] MEDICAID FL2 LEVEL OF CARE SCREENING TOOL     IDENTIFICATION  Patient Name: Cheryl Mueller Birthdate: 1941/05/20 Sex: female Admission Date (Current Location): 04/17/2015  [place] and [place] Number: Administrator and Address:  [facility], [address] [place], [place] [address]      Provider Number: [id number]  Attending Physician Name and Address:  Lynnea Ferrier, MD  Relative Name and Phone Number:       Current Level of Care: SNF Recommended Level of Care: Nursing Facility Prior Approval Number:    Date Approved/Denied:   PASRR Number:    Discharge Plan: SNF    Current Diagnoses: Patient Active Problem List   Diagnosis Date Noted  . Acute kidney injury (HCC) 04/20/2015  . Compression fracture of L1 lumbar vertebra (HCC) 04/18/2015  . Acute blood loss anemia 04/18/2015  . Hypokalemia 04/18/2015  . Dehydration 04/18/2015  . Hematoma 04/17/2015  . Cellulitis 04/17/2015  . Benign hypertension 04/17/2015  . Diabetes mellitus (HCC) 04/17/2015    Orientation ACTIVITIES/SOCIAL BLADDER RESPIRATION    Self, Time, Situation, Place  Family supportive Incontinent Normal  BEHAVIORAL SYMPTOMS/MOOD NEUROLOGICAL BOWEL NUTRITION STATUS      Continent Regular  PHYSICIAN VISITS COMMUNICATION OF NEEDS Height & Weight Skin    Verbally   152 lbs. Surgical wounds          AMBULATORY STATUS RESPIRATION    Assist extensive Normal      Personal Care Assistance Level of Assistance  Bathing, Dressing            Functional Limitations Info                SPECIAL CARE FACTORS FREQUENCY  PT (By licensed PT)     PT Frequency: 5x week             Additional Factors Info  Allergies    Alendronate, Atorvastatin, Formaldehyde         Current Medications (04/20/2015): Current Facility-Administered Medications  Medication Dose Route Frequency Provider Last Rate Last Dose  . acetaminophen (TYLENOL) tablet  650 mg  650 mg Oral Q6H PRN Curtis Sites III, MD       Or  . acetaminophen (TYLENOL) suppository 650 mg  650 mg Rectal Q6H PRN Curtis Sites III, MD      . aspirin EC tablet 81 mg  81 mg Oral Daily Kennedy Bucker, MD   81 mg at 04/20/15 0958  . atorvastatin (LIPITOR) tablet 80 mg  80 mg Oral QHS Kennedy Bucker, MD   80 mg at 04/19/15 2112  . calcium-vitamin D (OSCAL WITH D) 500-200 MG-UNIT per tablet 1 tablet  1 tablet Oral BID Kennedy Bucker, MD   1 tablet at 04/20/15 0957  . cephALEXin (KEFLEX) capsule 500 mg  500 mg Oral 3 times per day Curtis Sites III, MD   500 mg at 04/20/15 0551  . docusate sodium (COLACE) capsule 100 mg  100 mg Oral BID Curtis Sites III, MD   100 mg at 04/20/15 9562  . glimepiride (AMARYL) tablet 4 mg  4 mg Oral Q breakfast Curtis Sites III, MD   4 mg at 04/20/15 0802  . heparin injection 5,000 Units  5,000 Units Subcutaneous Q12H Lynnea Ferrier, MD   5,000 Units at 04/20/15 0957  . HYDROcodone-acetaminophen (NORCO/VICODIN) 5-325 MG per tablet 1-2 tablet  1-2 tablet Oral Q4H PRN Lynnea Ferrier, MD  2 tablet at 04/20/15 0957  . insulin aspart (novoLOG) injection 0-24 Units  0-24 Units Subcutaneous TID WC Lynnea FerrierBert J Klein III, MD   8 Units at 04/20/15 1221  . lisinopril (PRINIVIL,ZESTRIL) tablet 40 mg  40 mg Oral Daily Curtis SitesBert J Klein III, MD   40 mg at 04/20/15 0957  . magnesium oxide (MAG-OX) tablet 400 mg  400 mg Oral Daily Curtis SitesBert J Klein III, MD   400 mg at 04/20/15 0958  . metFORMIN (GLUCOPHAGE) tablet 500 mg  500 mg Oral BID WC Curtis SitesBert J Klein III, MD   500 mg at 04/20/15 0802  . morphine 2 MG/ML injection 2 mg  2 mg Intravenous Q2H PRN Curtis SitesBert J Klein III, MD   2 mg at 04/19/15 0217  . ondansetron (ZOFRAN) tablet 4 mg  4 mg Oral Q6H PRN Curtis SitesBert J Klein III, MD       Or  . ondansetron [facility](ZOFRAN) injection 4 mg  4 mg Intravenous Q6H PRN Curtis SitesBert J Klein III, MD   4 mg at 04/17/15 1755  . pantoprazole (PROTONIX) EC tablet 40 mg  40 mg Oral Daily Curtis SitesBert J Klein III, MD   40 mg at 04/20/15 0958  .  polyethylene glycol (MIRALAX / GLYCOLAX) packet 17 g  17 g Oral Daily Curtis SitesBert J Klein III, MD   17 g at 04/18/15 1017  . sulfamethoxazole-trimethoprim (BACTRIM DS,SEPTRA DS) 800-160 MG per tablet 1 tablet  1 tablet Oral Q12H Lynnea FerrierBert J Klein III, MD   1 tablet at 04/20/15 0957  . traZODone (DESYREL) tablet 25 mg  25 mg Oral QHS PRN Lynnea FerrierBert J Klein III, MD   25 mg at 04/19/15 2115   Do not use this list as official medication orders. Please verify with discharge summary.  Discharge Medications:   Medication List    ASK your doctor about these medications        amLODipine 10 MG tablet  Commonly known as:  NORVASC  Take 10 mg by mouth daily.     aspirin EC 81 MG tablet  Take 81 mg by mouth daily.     atorvastatin 80 MG tablet  Commonly known as:  LIPITOR  Take 80 mg by mouth at bedtime.     CALCIUM 600+D 600-400 MG-UNIT tablet  Generic drug:  Calcium Carbonate-Vitamin D  Take 1 tablet by mouth 2 (two) times daily.     glimepiride 4 MG tablet  Commonly known as:  AMARYL  Take 4 mg by mouth 2 (two) times daily.     hydrochlorothiazide 12.5 MG capsule  Commonly known as:  MICROZIDE  Take 12.5 mg by mouth daily.     HYDROcodone-acetaminophen 5-325 MG tablet  Commonly known as:  NORCO  Take 1 tablet by mouth every 6 (six) hours as needed for moderate pain.     ibuprofen 200 MG tablet  Commonly known as:  ADVIL,MOTRIN  Take 400 mg by mouth every 6 (six) hours as needed for headache or mild pain.     lisinopril 40 MG tablet  Commonly known as:  PRINIVIL,ZESTRIL  Take 40 mg by mouth 2 (two) times daily.     meloxicam 7.5 MG tablet  Commonly known as:  MOBIC  Take 7.5 mg by mouth daily.     metFORMIN 1000 MG tablet  Commonly known as:  GLUCOPHAGE  Take 1,000 mg by mouth 2 (two) times daily with a meal.        Relevant Imaging Results:  Relevant Lab Results:  Recent Labs    Additional Information    Ned Card, LCSW  York Spaniel MSW,LCSW

## 2015-04-20 NOTE — Progress Notes (Signed)
Patient ID: Cheryl Mueller, female   DOB: [birth year], 74 y.o.   MRN: [id number] SUBJECTIVE:  Admitted with progressive back/buttock pain, worsening ability to ambulate; found to have cellulitis, left buttock hematoma, hypokalemia, dehydration, L1 compression fracture.  Switched to oral abx yesterday; tolerating well, without fever, chills.  Leukocytosis resolved.  Feels hungry today; bowels moved yesterday.  Underwent kyphoplasty L1 yesterday; pain much improved today, though still with discomfort left buttock. BP, Glucose trending downward.  ______________________________________________________________________  ROS: Please see HPI; remainder of complete 10 point ROS is negative   Past Medical History  Diagnosis Date  . Hypertension   . Diabetes mellitus without complication ([place])   . TIA (transient ischemic attack)     approx 15 years ago  . Vertigo     hx of  . Hyperlipemia     Past Surgical History  Procedure Laterality Date  . Colonoscopy w/ polypectomy    . Lumbar laminectomy/decompression microdiscectomy Right 11/26/2012    Procedure: LUMBAR LAMINECTOMY/DECOMPRESSION MICRODISCECTOMY 1 LEVEL;  Surgeon: Hosie Spangle, MD;  Location: [facility];  Service: Neurosurgery;  Laterality: Right;  Lumbar five-sacral one laminotomy and microdiskectomy   . Orif patella Left 03/23/2015    Procedure: OPEN REDUCTION INTERNAL (ORIF) FIXATION PATELLA;  Surgeon: Hessie Knows, MD;  Location: [facility];  Service: Orthopedics;  Laterality: Left;  Marland Kitchen Kyphoplasty N/A 04/19/2015    Procedure: KYPHOPLASTY L 1;  Surgeon: Hessie Knows, MD;  Location: [facility];  Service: Orthopedics;  Laterality: N/A;     Current facility-administered medications:  .  acetaminophen (TYLENOL) tablet 650 mg, 650 mg, Oral, Q6H PRN **OR** acetaminophen (TYLENOL) suppository 650 mg, 650 mg, Rectal, Q6H PRN, Tama High III, MD .  aspirin EC tablet 81 mg, 81 mg, Oral, Daily, Hessie Knows, MD, 81 mg at 04/19/15 1459 .   atorvastatin (LIPITOR) tablet 80 mg, 80 mg, Oral, QHS, Hessie Knows, MD, 80 mg at 04/19/15 2112 .  calcium-vitamin D (OSCAL WITH D) 500-200 MG-UNIT per tablet 1 tablet, 1 tablet, Oral, BID, Hessie Knows, MD, 1 tablet at 04/19/15 2111 .  cephALEXin (KEFLEX) capsule 500 mg, 500 mg, Oral, 3 times per day, Tama High III, MD, 500 mg at 04/20/15 0551 .  docusate sodium (COLACE) capsule 100 mg, 100 mg, Oral, BID, Tama High III, MD, 100 mg at 04/19/15 2111 .  glimepiride (AMARYL) tablet 4 mg, 4 mg, Oral, Q breakfast, Adin Hector, MD, Stopped at 04/19/15 [phone] .  heparin injection 5,000 Units, 5,000 Units, Subcutaneous, Q12H, Tama High III, MD .  HYDROcodone-acetaminophen (NORCO/VICODIN) 5-325 MG per tablet 1-2 tablet, 1-2 tablet, Oral, Q4H PRN, Adin Hector, MD, 2 tablet at 04/19/15 2055 .  insulin aspart (novoLOG) injection 0-24 Units, 0-24 Units, Subcutaneous, TID WC, Tama High III, MD, 4 Units at 04/19/15 1653 .  lisinopril (PRINIVIL,ZESTRIL) tablet 40 mg, 40 mg, Oral, Daily, Tama High III, MD, 40 mg at 04/19/15 0845 .  magnesium oxide (MAG-OX) tablet 400 mg, 400 mg, Oral, Daily, Tama High III, MD, 400 mg at 04/19/15 0845 .  metFORMIN (GLUCOPHAGE) tablet 500 mg, 500 mg, Oral, BID WC, Tama High III, MD, 500 mg at 04/19/15 1653 .  morphine 2 MG/ML injection 2 mg, 2 mg, Intravenous, Q2H PRN, Tama High III, MD, 2 mg at 04/19/15 0217 .  ondansetron (ZOFRAN) tablet 4 mg, 4 mg, Oral, Q6H PRN **OR** ondansetron (ZOFRAN) injection 4 mg, 4 mg, Intravenous, Q6H PRN, Wendelyn Breslow  Arther Dames, MD, 4 mg at 04/17/15 1755 .  pantoprazole (PROTONIX) EC tablet 40 mg, 40 mg, Oral, Daily, Tama High III, MD, 40 mg at 04/19/15 0845 .  polyethylene glycol (MIRALAX / GLYCOLAX) packet 17 g, 17 g, Oral, Daily, Tama High III, MD, 17 g at 04/18/15 1017 .  sulfamethoxazole-trimethoprim (BACTRIM DS,SEPTRA DS) 800-160 MG per tablet 1 tablet, 1 tablet, Oral, Q12H, Tama High III, MD, 1 tablet at 04/19/15  2112 .  traZODone (DESYREL) tablet 25 mg, 25 mg, Oral, QHS PRN, Tama High III, MD, 25 mg at 04/19/15 2115  PHYSICAL EXAM:  BP 160/74 mmHg  Pulse 90  Temp(Src) 98 F (36.7 C) (Oral)  Resp 17  Ht _0  (1.499 m)  Wt 69.128 kg (152 lb 6.4 oz)  BMI 30.76 kg/m2  SpO2 97%  General: Well developed, well nourished female, in NAD HEENT: PERRL; OP moist without lesions. Neck: supple, trachea midline, no thyromegaly Chest: normal to palpation Lungs: clear bilaterally without retractions or wheezes Cardiovascular: RRR, 2/6 murmur, no gallop; distal pulses 2+ Abdomen: soft, nontender, nondistended, positive bowel sounds Extremities: no clubbing, cyanosis, edema.  Brace left knee.  left buttock hematoma, stable Neuro: alert, moves all extremities, though limited by pain in left leg Derm: 1.5 cm erythematous papule left buttock; redness reduced, though exam slightly challenging due to overnight pressure over the area Lymph: no cervical or supraclavicular lymphadenopathy  Labs and imaging studies were reviewed  ASSESSMENT/PLAN:   1. L1 comp fracture- greatly appreciate Ortho; s/p kyphoplasty, with significant pain reduction, indicating this was large portion of her back pain.  Will have her up with PT today to see if it was also major contributor to her ambulatory dysfunction 2. Left buttock cellulitis/hematoma.  Clinically improving; cont oral abx and follow exam.  Leukocytosis resolved.  Appreciate general surgery guidance 3. Hypokalemia/dehydration/acute kidney injury- Potassium, renal function back to normal.  Follow met b. Stopping IVF 4. Dm- has been uncontrolled; increased amaryl and restarted metformin.  Following effect. Cover with SSI 5. HTN-lisinopril increased yesterday; following BP as she becomes more mobile. 6. Anemia- likely with element of acute blood loss due to hematoma; evaluation underway.  Stable overnight.  Starting heparin for DVT prophyllaxis and will need to watch  blood counts with this 7. D/c plan- up with PT today.  May need STR.  Pt/family aware.

## 2015-04-20 NOTE — Evaluation (Signed)
Physical Therapy Evaluation Patient Details Name: Cheryl Mueller MRN: [id number] DOB: 07-Oct-1940 Today's Date: 04/20/2015   History of Present Illness  Pt is a 74 y.o. female s/p L patella fx about 1 month ago and s/p ORIF L patella 03/23/15 with MD Rosita KeaMenz (per verbal discussion with MD Rosita KeaMenz 04/20/15, pt WBAT with L LE brace locked in extension at all times).  Pt reports going straight home after surgery and fell same day (was getting out of bed reaching for walker) and c/o L buttock and low back pain.  Pt with large hematoma L buttock and imaging also showing L1 compression fx.  Pt s/p kyphoplasty 04/19/15 by MD Rosita KeaMenz.  PMH includes vertigo, TIA, and DM.  Clinical Impression  Currently pt demonstrates impairments with balance, general strength, and limitations with functional mobility.  Prior to admission (after L patella ORIF), pt was ambulating modified independently with SW.  Pt lives with her husband in 1 level home with 2-3 STE with L railing.  Currently pt is mod assist supine to sit, min assist to stand, and min assist to ambulate 20 feet with RW (pt's L knee brace locked in extension).  Pt with 2 posterior loss of balance during functional mobility using RW requiring mod assist to prevent fall.  Pt c/o minor low back pain with functional mobility but slightly more L knee pain.  Pt would benefit from skilled PT to address above noted impairments and functional limitations.  Recommend pt discharge to STR when medically appropriate.     Follow Up Recommendations SNF    Equipment Recommendations       Recommendations for Other Services       Precautions / Restrictions Precautions Precautions: Fall Required Braces or Orthoses: Knee Immobilizer - Left Knee Immobilizer - Left: On at all times (Keep locked in extension at all times) Restrictions Weight Bearing Restrictions: Yes LLE Weight Bearing: Weight bearing as tolerated Other Position/Activity Restrictions: L LE in brace locked in  extension at all times      Mobility  Bed Mobility Overal bed mobility: Needs Assistance Bed Mobility: Supine to Sit     Supine to sit: Mod assist     General bed mobility comments: assist for trunk; vc's and assist for logrolling required  Transfers Overall transfer level: Needs assistance Equipment used: Rolling walker (2 wheeled) Transfers: Sit to/from UGI CorporationStand;Stand Pivot Transfers Sit to Stand: Min assist Stand pivot transfers: Min assist;Mod assist       General transfer comment: pt min assist to stand with RW but pt with posterior loss of balance shortly after standing (mod assist to prevent fall backwards); pt then took a few steps to chair with RW and again with posterior loss of balance (mod assist again to prevent fall backwards again).  Ambulation/Gait Ambulation/Gait assistance: Min assist Ambulation Distance (Feet): 20 Feet Assistive device: Rolling walker (2 wheeled)   Gait velocity: decreased   General Gait Details: step to gait pattern; decreased B step length/foot clearance/heelstrike  Stairs            Wheelchair Mobility    Modified Rankin (Stroke Patients Only)       Balance Overall balance assessment: Needs assistance Sitting-balance support: Bilateral upper extremity supported;Feet supported Sitting balance-Leahy Scale: Fair     Standing balance support: Bilateral upper extremity supported;During functional activity (on RW) Standing balance-Leahy Scale: Poor Standing balance comment: posterior loss of balance 2x's  Pertinent Vitals/Pain Pain Assessment: 0-10 Pain Score: 4  Pain Location: low back and L patella Pain Descriptors / Indicators: Sore;Tender Pain Intervention(s): Limited activity within patient's tolerance;Monitored during session;Repositioned    Home Living Family/patient expects to be discharged to:: Skilled nursing facility Living Arrangements: Spouse/significant  other Available Help at Discharge: Family Type of Home: House Home Access: Stairs to enter Entrance Stairs-Rails: Left Entrance Stairs-Number of Steps: 2-3 Home Layout: One level Home Equipment: Environmental consultant - 2 wheels;Walker - standard;Shower seat      Prior Function Level of Independence: Independent with assistive device(s)         Comments: Pt was using SW after L patellar ORIF.     Hand Dominance        Extremity/Trunk Assessment   Upper Extremity Assessment: Generalized weakness           Lower Extremity Assessment: LLE deficits/detail;RLE deficits/detail RLE Deficits / Details: R LE ROM and strength WFL LLE Deficits / Details: good L ankle DF/PF AROM; L LE brace locked in extension at all times     Communication   Communication: No difficulties  Cognition Arousal/Alertness: Awake/alert Behavior During Therapy: WFL for tasks assessed/performed Overall Cognitive Status: Within Functional Limits for tasks assessed                      General Comments General comments (skin integrity, edema, etc.): L knee brace on (one side locked in extension and other not locked in extension); brace loose overall.  MD Rosita Kea paged to clarify WB'ing and brace restrictions.    Exercises  Pt educated on proper use of RW and given intermittent vc's for gait mechanics.  Pt required vc's for transfer technique and for forward lean on RW to decrease posterior loss of balances.  Pt and pt's husband also educated on spinal precautions s/p kyphoplasty and L LE precautions (WBAT with brace locked in extension at all times): both verbalizing good understanding.      Assessment/Plan    PT Assessment Patient needs continued PT services  PT Diagnosis Difficulty walking   PT Problem List Decreased strength;Decreased activity tolerance;Decreased balance;Decreased mobility;Decreased knowledge of use of DME;Decreased knowledge of precautions;Pain  PT Treatment Interventions DME  instruction;Gait training;Stair training;Functional mobility training;Therapeutic activities;Therapeutic exercise;Balance training;Neuromuscular re-education;Patient/family education;Manual techniques   PT Goals (Current goals can be found in the Care Plan section) Acute Rehab PT Goals Patient Stated Goal: to improve balance PT Goal Formulation: With patient/family Time For Goal Achievement: 05/04/15 Potential to Achieve Goals: Good    Frequency BID   Barriers to discharge Decreased caregiver support      Co-evaluation               End of Session Equipment Utilized During Treatment: Gait belt Activity Tolerance: Patient limited by fatigue Patient left: in chair;with call bell/phone within reach;with chair alarm set;with family/visitor present Nurse Communication: Mobility status;Precautions;Weight bearing status         Time: 1340-1420 PT Time Calculation (min) (ACUTE ONLY): 40 min   Charges:   PT Evaluation $Initial PT Evaluation Tier I: 1 Procedure PT Treatments $Gait Training: 8-22 mins $Therapeutic Activity: 8-22 mins   PT G CodesHendricks Limes 2015/05/10, 3:47 PM Hendricks Limes, PT [phone]

## 2015-04-20 NOTE — Clinical Social Work Note (Signed)
Clinical Social Work Assessment  Patient Details  Name: Cheryl Mueller MRN: [id number] Date of Birth: 1940/07/23  Date of referral:  04/20/15               Reason for consult:  Facility Placement                Permission sought to share information with:  Case Manager, Magazine features editoracility Contact Representative, Family Supports, PCP Permission granted to share information::  Yes, Verbal Permission Granted  Name::        Agency::  SNFS  Relationship::  Husband  Contact Information:     Housing/Transportation Living arrangements for the past 2 months:  Single Family Home Source of Information:  Patient, Medical Team Patient Interpreter Needed:  None Criminal Activity/Legal Involvement Pertinent to Current Situation/Hospitalization:  No - Comment as needed Significant Relationships:  Adult Children, Church, Spouse, Friend Lives with:  Spouse Do you feel safe going back to the place where you live?  No Need for family participation in patient care:  No (Coment)  Care giving concerns:  Pt lives with her husband who works during the day. Pt has supportive family but has always been active and has never needed care giving herself.    Social Worker assessment / plan:  Clinical Social Worker was referred to Pt to assist with dc planning. Pt is married to her husband of 52 years, she has 3 sons and 6 grandchildren. Pt is a retired Conservator, museum/gallerybook keeper, she is very active with her church and bridge club. CSW discussed dc recommendations, Pt is familiar with [facility] from friends who have done well in their rehab. CSW faxed Pt's information out to facilities and will follow up with Pt with bed offers. Pt likely to dc on Friday.   Employment status:  Retired Database administratornsurance information:  Managed Medicare PT Recommendations:  Skilled Nursing Facility Information / Referral to community resources:  Skilled Nursing Facility  Patient/Family's Response to care:  Pt's family at work, no one at bedside. Pt was sitting  in chair for the first time per her reports.   Patient/Family's Understanding of and Emotional Response to Diagnosis, Current Treatment, and Prognosis:  Pt stated that "I can't believe how sick I am, I am usually so active." Pt okay with dc to SNF for STR, states that her husband is supportive but will be at work all day.   Emotional Assessment Appearance:  Appears younger than stated age Attitude/Demeanor/Rapport:   (engaged) Affect (typically observed):  Apprehensive, Appropriate Orientation:  Oriented to Situation, Oriented to  Time, Oriented to Place, Oriented to Self Alcohol / Substance use:  Never Used Psych involvement (Current and /or in the community):  No (Comment)  Discharge Needs  Concerns to be addressed:  Adjustment to Illness, Discharge Planning Concerns Readmission within the last 30 days:  No Current discharge risk:  Dependent with Mobility Barriers to Discharge:  Continued Medical Work up   Stryker Corporationara N Aubrina Nieman, LCSW 04/20/2015, 4:47 PM

## 2015-04-20 NOTE — Progress Notes (Signed)
   Subjective: 1 Day Post-Op Procedure(s) (LRB): KYPHOPLASTY L 1 (N/A) Patient reports pain as moderate.  Pain improved after kypho procedure. Patient is well, and has had no acute complaints or problems No radicular symptoms.  Objective: Vital signs in last 24 hours: Temp:  [97 F (36.1 C)-98.7 F (37.1 C)] 98 F (36.7 C) (10/26 2122) Pulse Rate:  [90-96] 90 (10/26 2122) Resp:  [14-26] 17 (10/26 2122) BP: (107-160)/(47-74) 160/74 mmHg (10/26 2122) SpO2:  [93 %-99 %] 97 % (10/26 2122)  Intake/Output from previous day: 10/26 0701 - 10/27 0700 In: 1754.3 [I.V.:1754.3] Out: -  Intake/Output this shift: Total I/O In: 440 [P.O.:240; I.V.:200] Out: -    Recent Labs  04/17/15 1640 04/18/15 0651 04/19/15 0429 04/20/15 0446  HGB 11.8* 10.7* 9.8* 9.8*    Recent Labs  04/19/15 0429 04/20/15 0446  WBC 12.7* 7.0  RBC 3.29*  3.23* 3.43*  HCT 28.9* 30.1*  PLT 252 256    Recent Labs  04/19/15 0429 04/20/15 0446  NA 136 138  K 3.3* 3.8  CL 103 108  CO2 27 27  BUN 15 9  CREATININE 0.51 0.45  GLUCOSE 174* 133*  CALCIUM 8.1* 7.7*   No results for input(s): LABPT, INR in the last 72 hours.  EXAM General - Patient is Alert and Appropriate Extremity - Neurovascular intact Sensation intact distally Intact pulses distally Dorsiflexion/Plantar flexion intact Dressing -lumbar spine dressing C/D/I. Mild soft tissue paravertebral muscle tenderness. No spinous process tenderness Motor Function - intact, moving foot and toes well on exam. Knee brace intact to LLE  Past Medical History  Diagnosis Date  . Hypertension   . Diabetes mellitus without complication (HCC)   . TIA (transient ischemic attack)     approx 15 years ago  . Vertigo     hx of  . Hyperlipemia     Assessment/Plan:   1 Day Post-Op Procedure(s) (LRB): KYPHOPLASTY L 1 (N/A) Principal Problem:   Compression fracture of L1 lumbar vertebra (HCC) Active Problems:   Hematoma   Cellulitis   Benign  hypertension   Diabetes mellitus (HCC)   Acute blood loss anemia   Hypokalemia   Dehydration   Acute kidney injury (HCC)  Estimated body mass index is 30.76 kg/(m^2) as calculated from the following:   Height as of this encounter: 4\' 11"  (1.499 m).   Weight as of this encounter: 69.128 kg (152 lb 6.4 oz). Advance diet Up with therapy, activity as tolerated Follow up with [facility] in 2 weeks for L-spine xray   DVT Prophylaxis - Aspirin and heparin D/C O2 and Pulse OX and try on Room Air  T. Cranston Neighborhris Gaines, PA-C [facility] 04/20/2015, 11:59 AM

## 2015-04-20 NOTE — Care Management Note (Signed)
Case Management Note  Patient Details  Name: TARAH BUBOLTZ MRN: [id number] Date of Birth: 03/13/1941  Subjective/Objective:  Met with patient to discuss discharge planning.She lives at home with her significant other. PCP is Ramonita Lab. PT entering room to see patient. Patient states she would like short term rehab. She is having increased difficulty with ambulation. Left patient a list of home health providers. She currently has no home health.                   Action/Plan: PT pending recommendations.   Expected Discharge Date:                  Expected Discharge Plan:  [place]  In-House Referral:     Discharge planning Services  CM Consult  Post Acute Care Choice:    Choice offered to:  Patient  DME Arranged:    DME Agency:     HH Arranged:    [place] Agency:     Status of Service:  In process, will continue to follow  Medicare Important Message Given:  Yes-second notification given Date Medicare IM Given:    Medicare IM give by:    Date Additional Medicare IM Given:    Additional Medicare Important Message give by:     If discussed at [place] of Stay Meetings, dates discussed:    Additional Comments:  Jolly Mango, RN 04/20/2015, 1:41 PM

## 2015-04-21 LAB — CBC
HEMATOCRIT: 32 % — AB (ref 35.0–47.0)
Hemoglobin: 10.8 g/dL — ABNORMAL LOW (ref 12.0–16.0)
MCH: 29.8 pg (ref 26.0–34.0)
MCHC: 33.9 g/dL (ref 32.0–36.0)
MCV: 87.9 fL (ref 80.0–100.0)
Platelets: 251 10*3/uL (ref 150–440)
RBC: 3.64 MIL/uL — ABNORMAL LOW (ref 3.80–5.20)
RDW: 14.3 % (ref 11.5–14.5)
WBC: 8.5 10*3/uL (ref 3.6–11.0)

## 2015-04-21 LAB — GLUCOSE, CAPILLARY
GLUCOSE-CAPILLARY: 149 mg/dL — AB (ref 65–99)
Glucose-Capillary: 156 mg/dL — ABNORMAL HIGH (ref 65–99)
Glucose-Capillary: 224 mg/dL — ABNORMAL HIGH (ref 65–99)
Glucose-Capillary: 148 mg/dL — ABNORMAL HIGH (ref 65–99)

## 2015-04-21 LAB — BASIC METABOLIC PANEL
Anion gap: 6 (ref 5–15)
BUN: 7 mg/dL (ref 6–20)
CO2: 26 mmol/L (ref 22–32)
CREATININE: 0.44 mg/dL (ref 0.44–1.00)
Calcium: 8.1 mg/dL — ABNORMAL LOW (ref 8.9–10.3)
Chloride: 104 mmol/L (ref 101–111)
GFR calc Af Amer: >60 mL/min (ref >60)
GLUCOSE: 151 mg/dL — AB (ref 65–99)
Potassium: 3.6 mmol/L (ref 3.5–5.1)
Sodium: 136 mmol/L (ref 135–145)

## 2015-04-21 MED ORDER — HYDROCODONE-ACETAMINOPHEN 7.5-325 MG PO TABS: 1.0000 | ORAL_TABLET | Freq: Four times a day (QID) | ORAL | Status: DC | PRN

## 2015-04-21 MED ORDER — ROPINIROLE HCL 0.25 MG PO TABS: 0.2500 mg | ORAL_TABLET | Freq: Every day | ORAL | Status: DC

## 2015-04-21 MED ORDER — TRAZODONE HCL 50 MG PO TABS: 25.0000 mg | ORAL_TABLET | Freq: Every evening | ORAL | Status: DC | PRN

## 2015-04-21 MED ORDER — FERROUS SULFATE DRIED ER 140 (45 FE) MG PO TBCR: 140.0000 mg | EXTENDED_RELEASE_TABLET | Freq: Every day | ORAL | Status: DC

## 2015-04-21 MED ORDER — GLIMEPIRIDE 4 MG PO TABS: 4.0000 mg | ORAL_TABLET | Freq: Every day | ORAL | Status: DC

## 2015-04-21 MED ORDER — SULFAMETHOXAZOLE-TRIMETHOPRIM 800-160 MG PO TABS: 1.0000 | ORAL_TABLET | Freq: Two times a day (BID) | ORAL | Status: DC

## 2015-04-21 MED ORDER — CEPHALEXIN 500 MG PO CAPS: 500.0000 mg | ORAL_CAPSULE | Freq: Three times a day (TID) | ORAL | Status: AC

## 2015-04-21 MED ORDER — INSULIN ASPART 100 UNIT/ML ~~LOC~~ SOLN: 0.0000 [IU] | Freq: Three times a day (TID) | SUBCUTANEOUS | Status: DC

## 2015-04-21 MED ORDER — DOCUSATE SODIUM 100 MG PO CAPS: 100.0000 mg | ORAL_CAPSULE | Freq: Two times a day (BID) | ORAL | Status: DC

## 2015-04-21 MED ORDER — POLYETHYLENE GLYCOL 3350 17 G PO PACK: 17.0000 g | PACK | Freq: Every day | ORAL | Status: DC

## 2015-04-21 MED ORDER — FOLIC ACID 1 MG PO TABS: 1.0000 mg | ORAL_TABLET | Freq: Every day | ORAL | Status: DC

## 2015-04-21 NOTE — Progress Notes (Signed)
Pt to be discharged per MD order. IV removed. Instructions given to pt and husband. [facility] and [facility] to [facility]. Report called and given to [employer] [facility]. Pt going to be transported by husband to rehab facility.

## 2015-04-21 NOTE — Discharge Summary (Signed)
Physician Discharge Summary  Patient ID: Cheryl Mueller MRN: [id number] DOB/AGE: [birth year] 76 y.o.  Admit date: 04/17/2015 Discharge date: 04/21/2015  Admission Diagnoses:  Discharge Diagnoses:  Principal Problem:   Compression fracture of L1 lumbar vertebra [facility]) Active Problems:   Hematoma   Cellulitis   Benign hypertension   Diabetes mellitus ([place])   Acute blood loss anemia   Hypokalemia   Dehydration   Acute kidney injury [facility])   Discharged Condition: stable  Hospital Course: admitted with progressive back pain/ambulatory dysfunction.  Found to have left buttock cellulitis with large hematoma, and initially concerned that this hematoma was infected.  CT did not reveal abscess. Evaluated by surgery; placed on vanc/zosyn for this and leukocytosis, and converted to bactrim/keflex, with resolution of leukocytosis.  Exam with residual hematoma and small area of infection over left buttock  Had been found to have L1 comp fracture, likely after fall 9/16; CT revealed this to be at least 50% with retropulsed fragments.  ORtho consulted for this, as well as f/u of her recent ORIF for left patella fxr.  MRI performed, which revealed L1 comp fracture to be 90%.  Underwent kyphoplasty of this, with good improvement in pain level, though still with pain due to underlying DDD as well as hematoma  Noted to have hypokalemia (2.7) and AKI; responded to replacement and IVF, with normalization of creatinine and potassium. Taken off IVF and was able to maintain both of these levels.  Noted to have significant anemia, with component of acute blood loss due to hematoma; started on heparin subQ after procedure and tolerated this.  If mobility not improving rapidly, this will need to be restarted. Found to have iron and folate deficiency; supplements started.  B12 level was adequate.  Recommend CBC, met b be checked in 1 week  BP initially low, but improved after hydration.  Home BP meds resumed with  better control.  DM remained elevated, with slow reintroduction of home meds.  Will be kept on SSI for now.  Ambulated with PT and was extremely limited due to back pain, comp fxr, recent leg surgery; STR recommended and bed found. Will be d/ced to STR in stable condition; diet is NAS/NCS. Activity up as tolerated, with WBAT on left leg and leg brace to be in place, per Ortho instruction.  PT should evaluate and treat pt  Consults: general surgery and orthopedic surgery   Treatments: Kyphoplasty L1  Discharge Exam: Blood pressure 154/66, pulse 81, temperature 97.7 F (36.5 C), temperature source Oral, resp. rate 20, height 4' 11" (1.499 m), weight 69.128 kg (152 lb 6.4 oz), SpO2 96 %.   Disposition: SNF      Discharge Instructions    Diet - low sodium heart healthy    Complete by:  As directed   No concentrated sweets     Increase activity slowly    Complete by:  As directed   Weight bear as tolerated on left leg; left leg brace to remain in place            Medication List    STOP taking these medications        HYDROcodone-acetaminophen 5-325 MG tablet  Commonly known as:  NORCO  Replaced by:  HYDROcodone-acetaminophen 7.5-325 MG tablet     ibuprofen 200 MG tablet  Commonly known as:  ADVIL,MOTRIN      TAKE these medications        amLODipine 10 MG tablet  Commonly known as:  NORVASC  Take  10 mg by mouth daily.     aspirin EC 81 MG tablet  Take 81 mg by mouth daily.     atorvastatin 80 MG tablet  Commonly known as:  LIPITOR  Take 80 mg by mouth at bedtime.     CALCIUM 600+D 600-400 MG-UNIT tablet  Generic drug:  Calcium Carbonate-Vitamin D  Take 1 tablet by mouth 2 (two) times daily.     cephALEXin 500 MG capsule  Commonly known as:  KEFLEX  Take 1 capsule (500 mg total) by mouth 3 (three) times daily.     docusate sodium 100 MG capsule  Commonly known as:  COLACE  Take 1 capsule (100 mg total) by mouth 2 (two) times daily.     Ferrous Sulfate Dried  140 (45 FE) MG Tbcr  Take 140 mg by mouth daily with lunch.     folic acid 1 MG tablet  Commonly known as:  FOLVITE  Take 1 tablet (1 mg total) by mouth daily.     glimepiride 4 MG tablet  Commonly known as:  AMARYL  Take 1 tablet (4 mg total) by mouth daily with breakfast.     hydrochlorothiazide 12.5 MG capsule  Commonly known as:  MICROZIDE  Take 12.5 mg by mouth daily.     HYDROcodone-acetaminophen 7.5-325 MG tablet  Commonly known as:  NORCO  Take 1 tablet by mouth every 6 (six) hours as needed for moderate pain.     insulin aspart 100 UNIT/ML injection  Commonly known as:  novoLOG  Inject 0-24 Units into the skin 3 (three) times daily with meals.     lisinopril 40 MG tablet  Commonly known as:  PRINIVIL,ZESTRIL  Take 40 mg by mouth 2 (two) times daily.     meloxicam 7.5 MG tablet  Commonly known as:  MOBIC  Take 7.5 mg by mouth daily.     metFORMIN 1000 MG tablet  Commonly known as:  GLUCOPHAGE  Take 1,000 mg by mouth 2 (two) times daily with a meal.     polyethylene glycol packet  Commonly known as:  MIRALAX / GLYCOLAX  Take 17 g by mouth daily.     rOPINIRole 0.25 MG tablet  Commonly known as:  REQUIP  Take 1 tablet (0.25 mg total) by mouth at bedtime.     sulfamethoxazole-trimethoprim 800-160 MG tablet  Commonly known as:  BACTRIM DS,SEPTRA DS  Take 1 tablet by mouth 2 (two) times daily.     traZODone 50 MG tablet  Commonly known as:  DESYREL  Take 0.5 tablets (25 mg total) by mouth at bedtime as needed for sleep.       F/u with Dr. Rudene Christians in 1-2 weeks  Signed: Tama High III 04/21/2015, 8:15 AM

## 2015-04-21 NOTE — Progress Notes (Signed)
Physical Therapy Treatment Patient Details Name: Cheryl Mueller C Lubitz MRN: [id number] DOB: 1941-05-04 Today's Date: 04/21/2015    History of Present Illness Pt is a 74 y.o. female s/p L patella fx about 1 month ago and s/p ORIF L patella 03/23/15 with MD Rosita KeaMenz (per verbal discussion with MD Rosita KeaMenz 04/20/15, pt WBAT with L LE brace locked in extension at all times).  Pt reports going straight home after surgery and fell same day (was getting out of bed reaching for walker) and c/o L buttock and low back pain.  Pt with large hematoma L buttock and imaging also showing L1 compression fx.  Pt s/p kyphoplasty 04/19/15 by MD Rosita KeaMenz.  PMH includes vertigo, TIA, and DM.    PT Comments    Pt able to progress ambulation distance with RW and 1 assist but pt demonstrating decreased cadence and pt reporting fatigue with distance.  Pt with improved forward posture (leaning forward onto RW) today and no loss of balance noted during session.  Will continue to progress pt per pt tolerance.    Follow Up Recommendations  SNF     Equipment Recommendations       Recommendations for Other Services       Precautions / Restrictions Precautions Precautions: Back;Fall Precaution Comments: spinal precautions s/p kyphoplasty Required Braces or Orthoses: Knee Immobilizer - Left Knee Immobilizer - Left: On at all times (Keep locked in extension at all times) Restrictions Weight Bearing Restrictions: Yes LLE Weight Bearing: Weight bearing as tolerated Other Position/Activity Restrictions: L LE in brace locked in extension at all times    Mobility  Bed Mobility Overal bed mobility: Needs Assistance Bed Mobility: Supine to Sit;Sit to Supine     Supine to sit: Mod assist (assist for trunk and L LE) Sit to supine: Min assist (assist for L LE)   General bed mobility comments: vc's and assist for logrolling required  Transfers Overall transfer level: Needs assistance Equipment used: Rolling walker (2  wheeled) Transfers: Sit to/from Stand Sit to Stand: Min guard;Min assist         General transfer comment: inproved forward posture this morning (no loss of balance backwards); min vc's for technique required  Ambulation/Gait Ambulation/Gait assistance: Min guard;Min assist Ambulation Distance (Feet): 50 Feet Assistive device: Rolling walker (2 wheeled)   Gait velocity: decreased   General Gait Details: step to gait pattern; decreased B step length/foot clearance/heelstrike; vc's for gait technique required   Stairs            Wheelchair Mobility    Modified Rankin (Stroke Patients Only)       Balance Overall balance assessment: Needs assistance Sitting-balance support: Bilateral upper extremity supported;Feet supported Sitting balance-Leahy Scale: Good     Standing balance support: Bilateral upper extremity supported (on RW) Standing balance-Leahy Scale: Fair                      Cognition Arousal/Alertness: Awake/alert Behavior During Therapy: WFL for tasks assessed/performed Overall Cognitive Status: Within Functional Limits for tasks assessed                      Exercises   Performed semi-supine B LE therapeutic exercise x 10 reps:  Ankle pumps (AROM B LE's); quad sets x3 second holds (AROM R LE); glute squeezes x3 second holds (AROM B); SAQ's (AROM R); heelslides (AROM R), hip abd/adduction (AROM R).  Pt required vc's and tactile cues for correct technique with exercises.  General Comments General comments (skin integrity, edema, etc.): L knee brace on locked in extension  Nursing cleared pt for participation in physical therapy.  Pt agreeable to PT session.      Pertinent Vitals/Pain Pain Assessment: 0-10 Pain Score: 6  Pain Location: L buttock; (minimal low back and L patella pain) Pain Descriptors / Indicators: Sore;Tender Pain Intervention(s): Limited activity within patient's tolerance;Monitored during  session;Repositioned;Premedicated before session  Vitals stable and WFL throughout treatment session.    Home Living                      Prior Function            PT Goals (current goals can now be found in the care plan section) Acute Rehab PT Goals Patient Stated Goal: to improve balance PT Goal Formulation: With patient/family Time For Goal Achievement: 05/04/15 Potential to Achieve Goals: Good Progress towards PT goals: Progressing toward goals    Frequency  BID    PT Plan Current plan remains appropriate    Co-evaluation             End of Session Equipment Utilized During Treatment: Gait belt Activity Tolerance: Patient limited by fatigue;No increased pain Patient left: in bed;with call bell/phone within reach;with bed alarm set;with family/visitor present     Time: 1610-9604 PT Time Calculation (min) (ACUTE ONLY): 25 min  Charges:  $Gait Training: 8-22 mins $Therapeutic Exercise: 8-22 mins                    G CodesHendricks Limes 05/08/2015, 10:17 AM Hendricks Limes, PT [phone]

## 2015-04-21 NOTE — Care Management Important Message (Signed)
Important Message  Patient Details  Name: Cheryl Mueller MRN: [id number] Date of Birth: 12/21/1940   Medicare Important Message Given:  Yes-third notification given    Verita SchneidersKathy A Knowledge [place] 04/21/2015, 10:33 AM

## 2015-04-22 DIAGNOSIS — E119 Type 2 diabetes mellitus without complications: Secondary | ICD-10-CM | POA: Diagnosis not present

## 2015-04-22 DIAGNOSIS — Z794 Long term (current) use of insulin: Secondary | ICD-10-CM | POA: Diagnosis not present

## 2015-04-22 LAB — GLUCOSE, CAPILLARY
GLUCOSE-CAPILLARY: 187 mg/dL — AB (ref 65–99)
GLUCOSE-CAPILLARY: 139 mg/dL — AB (ref 65–99)
Glucose-Capillary: 140 mg/dL — ABNORMAL HIGH (ref 65–99)
Glucose-Capillary: 161 mg/dL — ABNORMAL HIGH (ref 65–99)

## 2015-04-23 DIAGNOSIS — E119 Type 2 diabetes mellitus without complications: Secondary | ICD-10-CM | POA: Diagnosis not present

## 2015-04-23 LAB — GLUCOSE, CAPILLARY
GLUCOSE-CAPILLARY: 175 mg/dL — AB (ref 65–99)
GLUCOSE-CAPILLARY: 155 mg/dL — AB (ref 65–99)
Glucose-Capillary: 110 mg/dL — ABNORMAL HIGH (ref 65–99)
Glucose-Capillary: 186 mg/dL — ABNORMAL HIGH (ref 65–99)

## 2015-04-24 DIAGNOSIS — E119 Type 2 diabetes mellitus without complications: Secondary | ICD-10-CM | POA: Diagnosis not present

## 2015-04-24 LAB — GLUCOSE, CAPILLARY
GLUCOSE-CAPILLARY: 144 mg/dL — AB (ref 65–99)
Glucose-Capillary: 226 mg/dL — ABNORMAL HIGH (ref 65–99)
Glucose-Capillary: 152 mg/dL — ABNORMAL HIGH (ref 65–99)

## 2015-04-25 ENCOUNTER — Encounter
Admission: RE | Admit: 2015-04-25 | Discharge: 2015-04-25 | Disposition: A | Discharge status: Home / Self Care | Payer: Medicare Other | Source: Ambulatory Visit | Attending: Internal Medicine | Admitting: Internal Medicine

## 2015-04-25 DIAGNOSIS — E119 Type 2 diabetes mellitus without complications: Secondary | ICD-10-CM | POA: Insufficient documentation

## 2015-04-25 DIAGNOSIS — Z794 Long term (current) use of insulin: Secondary | ICD-10-CM | POA: Diagnosis not present

## 2015-04-26 LAB — GLUCOSE, CAPILLARY
GLUCOSE-CAPILLARY: 221 mg/dL — AB (ref 65–99)
GLUCOSE-CAPILLARY: 109 mg/dL — AB (ref 65–99)
GLUCOSE-CAPILLARY: 159 mg/dL — AB (ref 65–99)
GLUCOSE-CAPILLARY: 167 mg/dL — AB (ref 65–99)
Glucose-Capillary: 126 mg/dL — ABNORMAL HIGH (ref 65–99)
Glucose-Capillary: 186 mg/dL — ABNORMAL HIGH (ref 65–99)
Glucose-Capillary: 173 mg/dL — ABNORMAL HIGH (ref 65–99)

## 2015-04-27 ENCOUNTER — Other Ambulatory Visit
Admission: RE | Admit: 2015-04-27 | Discharge: 2015-04-27 | Disposition: A | Discharge status: Home / Self Care | Payer: Medicare Other | Source: Skilled Nursing Facility | Attending: Internal Medicine | Admitting: Internal Medicine

## 2015-04-27 DIAGNOSIS — E119 Type 2 diabetes mellitus without complications: Secondary | ICD-10-CM | POA: Diagnosis not present

## 2015-04-27 DIAGNOSIS — E860 Dehydration: Secondary | ICD-10-CM | POA: Insufficient documentation

## 2015-04-27 DIAGNOSIS — D649 Anemia, unspecified: Secondary | ICD-10-CM | POA: Insufficient documentation

## 2015-04-27 LAB — GLUCOSE, CAPILLARY
GLUCOSE-CAPILLARY: 114 mg/dL — AB (ref 65–99)
GLUCOSE-CAPILLARY: 163 mg/dL — AB (ref 65–99)
Glucose-Capillary: 181 mg/dL — ABNORMAL HIGH (ref 65–99)
Glucose-Capillary: 121 mg/dL — ABNORMAL HIGH (ref 65–99)

## 2015-04-27 LAB — BASIC METABOLIC PANEL
Anion gap: 7 (ref 5–15)
BUN: 11 mg/dL (ref 6–20)
CALCIUM: 9.9 mg/dL (ref 8.9–10.3)
CO2: 28 mmol/L (ref 22–32)
CREATININE: 0.7 mg/dL (ref 0.44–1.00)
Chloride: 100 mmol/L — ABNORMAL LOW (ref 101–111)
GFR calc non Af Amer: >60 mL/min (ref >60)
Glucose, Bld: 114 mg/dL — ABNORMAL HIGH (ref 65–99)
Potassium: 5.3 mmol/L — ABNORMAL HIGH (ref 3.5–5.1)
SODIUM: 135 mmol/L (ref 135–145)

## 2015-04-27 LAB — CBC WITH DIFFERENTIAL/PLATELET
BASOS PCT: 2 %
Basophils Absolute: 0.3 10*3/uL — ABNORMAL HIGH (ref 0–0.1)
EOS ABS: 0.2 10*3/uL (ref 0–0.7)
EOS PCT: 2 %
HCT: 34.2 % — ABNORMAL LOW (ref 35.0–47.0)
HEMOGLOBIN: 11 g/dL — AB (ref 12.0–16.0)
Lymphocytes Relative: 3 %
Lymphs Abs: 0.4 10*3/uL — ABNORMAL LOW (ref 1.0–3.6)
MCH: 28.6 pg (ref 26.0–34.0)
MCHC: 32 g/dL (ref 32.0–36.0)
MCV: 89.3 fL (ref 80.0–100.0)
MONOS PCT: 4 %
Monocytes Absolute: 0.6 10*3/uL (ref 0.2–0.9)
NEUTROS PCT: 89 %
Neutro Abs: 12.7 10*3/uL — ABNORMAL HIGH (ref 1.4–6.5)
PLATELETS: 478 10*3/uL — AB (ref 150–440)
RBC: 3.83 MIL/uL (ref 3.80–5.20)
RDW: 14.5 % (ref 11.5–14.5)
WBC: 14.3 10*3/uL — AB (ref 3.6–11.0)

## 2015-04-28 DIAGNOSIS — E119 Type 2 diabetes mellitus without complications: Secondary | ICD-10-CM | POA: Diagnosis not present

## 2015-04-28 LAB — GLUCOSE, CAPILLARY
GLUCOSE-CAPILLARY: 144 mg/dL — AB (ref 65–99)
GLUCOSE-CAPILLARY: 101 mg/dL — AB (ref 65–99)
Glucose-Capillary: 83 mg/dL (ref 65–99)
Glucose-Capillary: 153 mg/dL — ABNORMAL HIGH (ref 65–99)

## 2015-04-29 DIAGNOSIS — E119 Type 2 diabetes mellitus without complications: Secondary | ICD-10-CM | POA: Diagnosis not present

## 2015-04-29 LAB — GLUCOSE, CAPILLARY: GLUCOSE-CAPILLARY: 95 mg/dL (ref 65–99)

## 2015-04-30 LAB — GLUCOSE, CAPILLARY
GLUCOSE-CAPILLARY: 118 mg/dL — AB (ref 65–99)
Glucose-Capillary: 150 mg/dL — ABNORMAL HIGH (ref 65–99)

## 2015-05-01 DIAGNOSIS — E119 Type 2 diabetes mellitus without complications: Secondary | ICD-10-CM | POA: Diagnosis not present

## 2015-05-01 LAB — GLUCOSE, CAPILLARY
GLUCOSE-CAPILLARY: 99 mg/dL (ref 65–99)
GLUCOSE-CAPILLARY: 114 mg/dL — AB (ref 65–99)
GLUCOSE-CAPILLARY: 176 mg/dL — AB (ref 65–99)
Glucose-Capillary: 236 mg/dL — ABNORMAL HIGH (ref 65–99)
Glucose-Capillary: 177 mg/dL — ABNORMAL HIGH (ref 65–99)
Glucose-Capillary: 96 mg/dL (ref 65–99)
Glucose-Capillary: 207 mg/dL — ABNORMAL HIGH (ref 65–99)

## 2015-05-02 DIAGNOSIS — E119 Type 2 diabetes mellitus without complications: Secondary | ICD-10-CM | POA: Diagnosis not present

## 2015-05-02 LAB — GLUCOSE, CAPILLARY: GLUCOSE-CAPILLARY: 132 mg/dL — AB (ref 65–99)

## 2015-05-08 DIAGNOSIS — S32000A Wedge compression fracture of unspecified lumbar vertebra, initial encounter for closed fracture: Secondary | ICD-10-CM | POA: Insufficient documentation

## 2015-05-08 DIAGNOSIS — D509 Iron deficiency anemia, unspecified: Secondary | ICD-10-CM | POA: Insufficient documentation

## 2015-05-08 DIAGNOSIS — E538 Deficiency of other specified B group vitamins: Secondary | ICD-10-CM | POA: Insufficient documentation

## 2015-09-05 ENCOUNTER — Other Ambulatory Visit: Payer: Self-pay | Admitting: Internal Medicine

## 2015-09-05 DIAGNOSIS — Z1231 Encounter for screening mammogram for malignant neoplasm of breast: Secondary | ICD-10-CM

## 2015-09-15 ENCOUNTER — Ambulatory Visit
Admission: RE | Admit: 2015-09-15 | Discharge: 2015-09-15 | Disposition: A | Discharge status: Home / Self Care | Payer: Medicare Other | Source: Ambulatory Visit | Attending: Internal Medicine | Admitting: Internal Medicine

## 2015-09-15 DIAGNOSIS — Z1231 Encounter for screening mammogram for malignant neoplasm of breast: Secondary | ICD-10-CM

## 2015-10-13 ENCOUNTER — Inpatient Hospital Stay
Admission: AD | Admit: 2015-10-13 | Discharge: 2015-10-18 | DRG: 581 | Disposition: A | Discharge status: Home / Self Care | Payer: Medicare Other | Source: Ambulatory Visit | Attending: Internal Medicine | Admitting: Internal Medicine

## 2015-10-13 ENCOUNTER — Encounter: Payer: Self-pay | Admitting: Internal Medicine

## 2015-10-13 ENCOUNTER — Ambulatory Visit
Admission: RE | Admit: 2015-10-13 | Discharge: 2015-10-13 | Disposition: A | Discharge status: Home / Self Care | Payer: Medicare Other | Source: Ambulatory Visit | Attending: Physician Assistant | Admitting: Physician Assistant

## 2015-10-13 ENCOUNTER — Other Ambulatory Visit: Payer: Self-pay | Admitting: Physician Assistant

## 2015-10-13 DIAGNOSIS — Q6102 Congenital multiple renal cysts: Secondary | ICD-10-CM

## 2015-10-13 DIAGNOSIS — Z823 Family history of stroke: Secondary | ICD-10-CM | POA: Diagnosis not present

## 2015-10-13 DIAGNOSIS — Z7984 Long term (current) use of oral hypoglycemic drugs: Secondary | ICD-10-CM | POA: Diagnosis not present

## 2015-10-13 DIAGNOSIS — Z833 Family history of diabetes mellitus: Secondary | ICD-10-CM

## 2015-10-13 DIAGNOSIS — R918 Other nonspecific abnormal finding of lung field: Secondary | ICD-10-CM | POA: Insufficient documentation

## 2015-10-13 DIAGNOSIS — Z8673 Personal history of transient ischemic attack (TIA), and cerebral infarction without residual deficits: Secondary | ICD-10-CM | POA: Diagnosis not present

## 2015-10-13 DIAGNOSIS — Z7982 Long term (current) use of aspirin: Secondary | ICD-10-CM | POA: Diagnosis not present

## 2015-10-13 DIAGNOSIS — Z79899 Other long term (current) drug therapy: Secondary | ICD-10-CM | POA: Diagnosis not present

## 2015-10-13 DIAGNOSIS — E785 Hyperlipidemia, unspecified: Secondary | ICD-10-CM | POA: Diagnosis present

## 2015-10-13 DIAGNOSIS — I10 Essential (primary) hypertension: Secondary | ICD-10-CM | POA: Diagnosis present

## 2015-10-13 DIAGNOSIS — L02211 Cutaneous abscess of abdominal wall: Secondary | ICD-10-CM | POA: Diagnosis present

## 2015-10-13 DIAGNOSIS — L03311 Cellulitis of abdominal wall: Secondary | ICD-10-CM

## 2015-10-13 DIAGNOSIS — Z888 Allergy status to other drugs, medicaments and biological substances status: Secondary | ICD-10-CM

## 2015-10-13 DIAGNOSIS — I700 Atherosclerosis of aorta: Secondary | ICD-10-CM | POA: Insufficient documentation

## 2015-10-13 DIAGNOSIS — R21 Rash and other nonspecific skin eruption: Secondary | ICD-10-CM | POA: Diagnosis present

## 2015-10-13 DIAGNOSIS — K8020 Calculus of gallbladder without cholecystitis without obstruction: Secondary | ICD-10-CM | POA: Insufficient documentation

## 2015-10-13 DIAGNOSIS — Z9889 Other specified postprocedural states: Secondary | ICD-10-CM

## 2015-10-13 DIAGNOSIS — Z8249 Family history of ischemic heart disease and other diseases of the circulatory system: Secondary | ICD-10-CM | POA: Diagnosis not present

## 2015-10-13 DIAGNOSIS — E1165 Type 2 diabetes mellitus with hyperglycemia: Secondary | ICD-10-CM | POA: Diagnosis present

## 2015-10-13 DIAGNOSIS — E876 Hypokalemia: Secondary | ICD-10-CM | POA: Diagnosis present

## 2015-10-13 LAB — CREATININE, SERUM: CREATININE: 0.53 mg/dL (ref 0.44–1.00)

## 2015-10-13 LAB — CBC
HCT: 35.3 % (ref 35.0–47.0)
Hemoglobin: 11.9 g/dL — ABNORMAL LOW (ref 12.0–16.0)
MCH: 29.6 pg (ref 26.0–34.0)
MCHC: 33.6 g/dL (ref 32.0–36.0)
MCV: 88.2 fL (ref 80.0–100.0)
PLATELETS: 205 10*3/uL (ref 150–440)
RBC: 4.01 MIL/uL (ref 3.80–5.20)
RDW: 13 % (ref 11.5–14.5)
WBC: 21.2 10*3/uL — AB (ref 3.6–11.0)

## 2015-10-13 LAB — GLUCOSE, CAPILLARY: GLUCOSE-CAPILLARY: 277 mg/dL — AB (ref 65–99)

## 2015-10-13 MED ORDER — TRAZODONE HCL 50 MG PO TABS
25.0000 mg | ORAL_TABLET | Freq: Every evening | ORAL | Status: DC | PRN
  Administered 2015-10-13 – 2015-10-18 (×4): 25 mg via ORAL
  Filled 2015-10-13 (×4): qty 1

## 2015-10-13 MED ORDER — MELOXICAM 7.5 MG PO TABS
7.5000 mg | ORAL_TABLET | Freq: Every day | ORAL | Status: DC
  Administered 2015-10-13 – 2015-10-18 (×6): 7.5 mg via ORAL
  Filled 2015-10-13 (×7): qty 1

## 2015-10-13 MED ORDER — IOPAMIDOL (ISOVUE-300) INJECTION 61%
100.0000 mL | Freq: Once | INTRAVENOUS | Status: AC | PRN
  Administered 2015-10-13: 100 mL via INTRAVENOUS

## 2015-10-13 MED ORDER — MORPHINE SULFATE (PF) 2 MG/ML IV SOLN
2.0000 mg | INTRAVENOUS | Status: DC | PRN
  Administered 2015-10-13 – 2015-10-14 (×5): 2 mg via INTRAVENOUS
  Filled 2015-10-13 (×6): qty 1

## 2015-10-13 MED ORDER — PIPERACILLIN-TAZOBACTAM 3.375 G IVPB
3.3750 g | Freq: Three times a day (TID) | INTRAVENOUS | Status: DC
  Administered 2015-10-13 – 2015-10-17 (×10): 3.375 g via INTRAVENOUS
  Filled 2015-10-13 (×13): qty 50

## 2015-10-13 MED ORDER — ONDANSETRON HCL 4 MG PO TABS: 4.0000 mg | ORAL_TABLET | Freq: Four times a day (QID) | ORAL | Status: DC | PRN

## 2015-10-13 MED ORDER — CALCIUM CARBONATE-VITAMIN D 500-200 MG-UNIT PO TABS
1.0000 | ORAL_TABLET | Freq: Two times a day (BID) | ORAL | Status: DC
  Administered 2015-10-13 – 2015-10-18 (×10): 1 via ORAL
  Filled 2015-10-13 (×10): qty 1

## 2015-10-13 MED ORDER — AMLODIPINE BESYLATE 10 MG PO TABS
10.0000 mg | ORAL_TABLET | Freq: Every day | ORAL | Status: DC
  Administered 2015-10-13 – 2015-10-18 (×6): 10 mg via ORAL
  Filled 2015-10-13 (×6): qty 1

## 2015-10-13 MED ORDER — ROPINIROLE HCL 0.25 MG PO TABS
0.2500 mg | ORAL_TABLET | Freq: Every day | ORAL | Status: DC
  Administered 2015-10-13 – 2015-10-17 (×5): 0.25 mg via ORAL
  Filled 2015-10-13 (×5): qty 1

## 2015-10-13 MED ORDER — FERROUS SULFATE 325 (65 FE) MG PO TABS
325.0000 mg | ORAL_TABLET | Freq: Every day | ORAL | Status: DC
Start: 2015-10-14 — End: 2015-10-18
  Administered 2015-10-14 – 2015-10-17 (×3): 325 mg via ORAL
  Filled 2015-10-13 (×4): qty 1

## 2015-10-13 MED ORDER — ONDANSETRON HCL 4 MG/2ML IJ SOLN
4.0000 mg | Freq: Four times a day (QID) | INTRAMUSCULAR | Status: DC | PRN
  Administered 2015-10-15 – 2015-10-16 (×2): 4 mg via INTRAVENOUS
  Filled 2015-10-13: qty 2

## 2015-10-13 MED ORDER — VANCOMYCIN HCL IN DEXTROSE 750-5 MG/150ML-% IV SOLN
750.0000 mg | INTRAVENOUS | Status: DC
Start: 2015-10-14 — End: 2015-10-17
  Administered 2015-10-14 – 2015-10-17 (×4): 750 mg via INTRAVENOUS
  Filled 2015-10-13 (×4): qty 150

## 2015-10-13 MED ORDER — LISINOPRIL 20 MG PO TABS
40.0000 mg | ORAL_TABLET | Freq: Two times a day (BID) | ORAL | Status: DC
  Administered 2015-10-13 – 2015-10-18 (×10): 40 mg via ORAL
  Filled 2015-10-13 (×10): qty 2

## 2015-10-13 MED ORDER — FOLIC ACID 1 MG PO TABS
1.0000 mg | ORAL_TABLET | Freq: Every day | ORAL | Status: DC
  Administered 2015-10-13 – 2015-10-18 (×6): 1 mg via ORAL
  Filled 2015-10-13 (×6): qty 1

## 2015-10-13 MED ORDER — ENOXAPARIN SODIUM 40 MG/0.4ML ~~LOC~~ SOLN
40.0000 mg | SUBCUTANEOUS | Status: DC
  Administered 2015-10-13 – 2015-10-17 (×5): 40 mg via SUBCUTANEOUS
  Filled 2015-10-13 (×5): qty 0.4

## 2015-10-13 MED ORDER — GLIMEPIRIDE 2 MG PO TABS
4.0000 mg | ORAL_TABLET | Freq: Every day | ORAL | Status: DC
  Administered 2015-10-14 – 2015-10-18 (×5): 4 mg via ORAL
  Filled 2015-10-13: qty 2
  Filled 2015-10-13: qty 1
  Filled 2015-10-13: qty 2
  Filled 2015-10-13: qty 1
  Filled 2015-10-13 (×3): qty 2

## 2015-10-13 MED ORDER — ASPIRIN EC 81 MG PO TBEC
81.0000 mg | DELAYED_RELEASE_TABLET | Freq: Every day | ORAL | Status: DC
  Administered 2015-10-13 – 2015-10-18 (×6): 81 mg via ORAL
  Filled 2015-10-13 (×6): qty 1

## 2015-10-13 MED ORDER — INSULIN ASPART 100 UNIT/ML ~~LOC~~ SOLN
0.0000 [IU] | Freq: Three times a day (TID) | SUBCUTANEOUS | Status: DC
  Administered 2015-10-14 (×2): 2 [IU] via SUBCUTANEOUS
  Administered 2015-10-14: 3 [IU] via SUBCUTANEOUS
  Administered 2015-10-15: 2 [IU] via SUBCUTANEOUS
  Administered 2015-10-15: 1 [IU] via SUBCUTANEOUS
  Administered 2015-10-15: 2 [IU] via SUBCUTANEOUS
  Administered 2015-10-16: 1 [IU] via SUBCUTANEOUS
  Administered 2015-10-16 – 2015-10-17 (×2): 3 [IU] via SUBCUTANEOUS
  Administered 2015-10-17: 7 [IU] via SUBCUTANEOUS
  Administered 2015-10-17: 2 [IU] via SUBCUTANEOUS
  Filled 2015-10-13: qty 7
  Filled 2015-10-13: qty 3
  Filled 2015-10-13 (×2): qty 2
  Filled 2015-10-13: qty 4
  Filled 2015-10-13: qty 3
  Filled 2015-10-13: qty 2
  Filled 2015-10-13: qty 1
  Filled 2015-10-13: qty 2
  Filled 2015-10-13: qty 1
  Filled 2015-10-13: qty 3
  Filled 2015-10-13: qty 2

## 2015-10-13 MED ORDER — INSULIN ASPART 100 UNIT/ML ~~LOC~~ SOLN
0.0000 [IU] | Freq: Every day | SUBCUTANEOUS | Status: DC
  Administered 2015-10-13: 3 [IU] via SUBCUTANEOUS
  Administered 2015-10-16: 4 [IU] via SUBCUTANEOUS
  Filled 2015-10-13: qty 3

## 2015-10-13 MED ORDER — ACETAMINOPHEN 650 MG RE SUPP: 650.0000 mg | Freq: Four times a day (QID) | RECTAL | Status: DC | PRN

## 2015-10-13 MED ORDER — VANCOMYCIN HCL IN DEXTROSE 1-5 GM/200ML-% IV SOLN
1000.0000 mg | Freq: Once | INTRAVENOUS | Status: AC
  Administered 2015-10-13: 1000 mg via INTRAVENOUS
  Filled 2015-10-13: qty 200

## 2015-10-13 MED ORDER — ACETAMINOPHEN 325 MG PO TABS
650.0000 mg | ORAL_TABLET | Freq: Four times a day (QID) | ORAL | Status: DC | PRN
  Administered 2015-10-13: 650 mg via ORAL
  Filled 2015-10-13: qty 2

## 2015-10-13 MED ORDER — POTASSIUM CHLORIDE IN NACL 20-0.9 MEQ/L-% IV SOLN
INTRAVENOUS | Status: DC
  Administered 2015-10-13 – 2015-10-16 (×6): via INTRAVENOUS
  Administered 2015-10-17: 1000 mL via INTRAVENOUS
  Filled 2015-10-13 (×8): qty 1000

## 2015-10-13 MED ORDER — DOCUSATE SODIUM 100 MG PO CAPS
100.0000 mg | ORAL_CAPSULE | Freq: Two times a day (BID) | ORAL | Status: DC
  Administered 2015-10-14 – 2015-10-17 (×8): 100 mg via ORAL
  Filled 2015-10-13 (×10): qty 1

## 2015-10-13 MED ORDER — HYDROCODONE-ACETAMINOPHEN 7.5-325 MG PO TABS
1.0000 | ORAL_TABLET | Freq: Four times a day (QID) | ORAL | Status: DC | PRN
  Administered 2015-10-13 – 2015-10-17 (×8): 1 via ORAL
  Filled 2015-10-13 (×10): qty 1

## 2015-10-13 NOTE — H&P (Signed)
[facility] [place] at [facility]   PATIENT NAME: Cheryl Mueller    MR#:  [id number]  DATE OF BIRTH:  January 02, 1941  DATE OF ADMISSION:  10/13/2015  PRIMARY CARE PHYSICIAN: Lynnea Ferrier, MD   REQUESTING/REFERRING PHYSICIAN: Dr. Daniel Nones  CHIEF COMPLAINT:  No chief complaint on file.   HISTORY OF PRESENT ILLNESS:  Cheryl Mueller  is a 75 y.o. female with a known history of hypertension, non-insulin-dependent diabetes mellitus, hyperlipidemia presents to the hospital secondary to right lower quadrant abdominal wall cellulitis. Symptoms started like a lump noticed in the right lower quadrant of her abdomen about 2-3 days ago. She denies any trauma or injury to that site. Then she started noticing that the lump converted into spreading redness, swelling and tenderness in that region. Plan to see her PCP today who ordered a CT of her abdomen and labs. CT of the abdomen showing severe cellulitis and labs showing an elevated white count of 22,000. Patient is sent in for direct admit for early sepsis secondary to abdominal wall cellulitis and also severe pain requiring IV pain medications. Patient does have known diabetes with uncontrolled blood sugars. She denies any fevers but complains of headache and chills at home for the last 2 days. No nausea vomiting or diarrhea. Significant pain of her abdomen at the site of cellulitis.  PAST MEDICAL HISTORY:   Past Medical History  Diagnosis Date  . Hypertension   . Diabetes mellitus without complication (HCC)     Non Insulin dependant  . TIA (transient ischemic attack)     approx 15 years ago  . Vertigo     hx of  . Hyperlipemia     PAST SURGICAL HISTORY:   Past Surgical History  Procedure Laterality Date  . Colonoscopy w/ polypectomy    . Lumbar laminectomy/decompression microdiscectomy Right 11/26/2012    Procedure: LUMBAR LAMINECTOMY/DECOMPRESSION MICRODISCECTOMY 1 LEVEL;  Surgeon: Hewitt Shorts, MD;   Location: [facility];  Service: Neurosurgery;  Laterality: Right;  Lumbar five-sacral one laminotomy and microdiskectomy   . Orif patella Left 03/23/2015    Procedure: OPEN REDUCTION INTERNAL (ORIF) FIXATION PATELLA;  Surgeon: Kennedy Bucker, MD;  Location: [facility] ORS;  Service: Orthopedics;  Laterality: Left;  Marland Kitchen Kyphoplasty N/A 04/19/2015    Procedure: KYPHOPLASTY L 1;  Surgeon: Kennedy Bucker, MD;  Location: [facility] ORS;  Service: Orthopedics;  Laterality: N/A;    SOCIAL HISTORY:   Social History  Substance Use Topics  . Smoking status: Never Smoker   . Smokeless tobacco: Never Used  . Alcohol Use: No    FAMILY HISTORY:   Family History  Problem Relation Age of Onset  . Stroke Mother   . Breast cancer Neg Hx   . Hypertension Mother   . Diabetes Father     DRUG ALLERGIES:   Allergies  Allergen Reactions  . Alendronate Other (See Comments)    Reaction:  Leg pain   . Atorvastatin Rash  . Formaldehyde Rash    REVIEW OF SYSTEMS:   Review of Systems  Constitutional: Positive for chills and malaise/fatigue. Negative for fever and weight loss.  HENT: Negative for ear discharge, ear pain, nosebleeds and tinnitus.   Eyes: Negative for blurred vision, double vision and photophobia.  Respiratory: Negative for cough, hemoptysis, shortness of breath and wheezing.   Cardiovascular: Negative for chest pain, palpitations, orthopnea and leg swelling.  Gastrointestinal: Positive for abdominal pain. Negative for heartburn, nausea, vomiting, diarrhea, constipation and melena.  Genitourinary:  Negative for dysuria, urgency, frequency and hematuria.  Musculoskeletal: Positive for back pain. Negative for myalgias and neck pain.  Skin: Positive for rash.  Neurological: Positive for headaches. Negative for dizziness, tremors, sensory change, speech change and focal weakness.  Endo/Heme/Allergies: Does not bruise/bleed easily.  Psychiatric/Behavioral: Negative for depression.    MEDICATIONS AT  HOME:   Prior to Admission medications   Medication Sig Start Date End Date Taking? Authorizing Provider  amLODipine (NORVASC) 10 MG tablet Take 10 mg by mouth daily.    Yes Historical Provider, MD  aspirin EC 81 MG tablet Take 81 mg by mouth daily.   Yes Historical Provider, MD  atorvastatin (LIPITOR) 80 MG tablet Take 80 mg by mouth at bedtime.   Yes Historical Provider, MD  Calcium Carbonate-Vitamin D (CALCIUM 600+D) 600-400 MG-UNIT tablet Take 1 tablet by mouth 2 (two) times daily.   Yes Historical Provider, MD  glimepiride (AMARYL) 4 MG tablet Take 1 tablet (4 mg total) by mouth daily with breakfast. 04/21/15  Yes Curtis SitesBert J Klein III, MD  hydrochlorothiazide (MICROZIDE) 12.5 MG capsule Take 12.5 mg by mouth daily.    Yes Historical Provider, MD  Ibuprofen-Diphenhydramine Cit (IBUPROFEN PM) 200-38 MG TABS Take 2 tablets by mouth at bedtime as needed (for sleep).   Yes Historical Provider, MD  lisinopril (PRINIVIL,ZESTRIL) 40 MG tablet Take 40 mg by mouth 2 (two) times daily.    Yes Historical Provider, MD  metFORMIN (GLUCOPHAGE) 1000 MG tablet Take 1,000 mg by mouth 2 (two) times daily with a meal.   Yes Historical Provider, MD  mometasone (ELOCON) 0.1 % cream Apply 1 application topically daily as needed (for itching).   Yes Historical Provider, MD  rOPINIRole (REQUIP) 0.25 MG tablet Take 1 tablet (0.25 mg total) by mouth at bedtime. 04/21/15  Yes Curtis SitesBert J Klein III, MD      VITAL SIGNS:  There were no vitals taken for this visit.  PHYSICAL EXAMINATION:   Physical Exam  GENERAL:  75 y.o.-year-old patient lying in the bed with no acute distress.  EYES: Pupils equal, round, reactive to light and accommodation. No scleral icterus. Extraocular muscles intact.  HEENT: Head atraumatic, normocephalic. Oropharynx and nasopharynx clear.  NECK:  Supple, no jugular venous distention. No thyroid enlargement, no tenderness.  LUNGS: Normal breath sounds bilaterally, no wheezing, rales,rhonchi or  crepitation. No use of accessory muscles of respiration. Decreased bibasilar breath sounds. CARDIOVASCULAR: S1, S2 normal. No murmurs, rubs, or gallops.  ABDOMEN: Soft, but in RLQ there is tender spot, erythema and swelling noted- outlined. Other than that it is nontender, nondistended. Bowel sounds present. No organomegaly or mass.  EXTREMITIES: No pedal edema, cyanosis, or clubbing.  NEUROLOGIC: Cranial nerves II through XII are intact. Muscle strength 5/5 in all extremities. Sensation intact. Gait not checked.  PSYCHIATRIC: The patient is alert and oriented x 3.  SKIN: No obvious rash, lesion, or ulcer.   LABORATORY PANEL:   CBC No results for input(s): WBC, HGB, HCT, PLT in the last 168 hours. ------------------------------------------------------------------------------------------------------------------  Chemistries  No results for input(s): NA, K, CL, CO2, GLUCOSE, BUN, CREATININE, CALCIUM, MG, AST, ALT, ALKPHOS, BILITOT in the last 168 hours.  Invalid input(s): GFRCGP ------------------------------------------------------------------------------------------------------------------  Cardiac Enzymes No results for input(s): TROPONINI in the last 168 hours. ------------------------------------------------------------------------------------------------------------------  RADIOLOGY:  Ct Abdomen W Contrast  10/13/2015  CLINICAL DATA:  Pain, redness, swelling and tenderness involving the right lower abdominal wall. No known injury. EXAM: CT ABDOMEN WITH CONTRAST TECHNIQUE: Multidetector CT imaging of the  abdomen was performed using the standard protocol following bolus administration of intravenous contrast. CONTRAST:  ISOVUE-300 IOPAMIDOL (ISOVUE-300) INJECTION 61% COMPARISON:  04/17/2015 FINDINGS: Lower chest: The lung bases are clear of acute process. Mild scarring changes and dependent subpleural atelectasis. The heart is normal in size. No pericardial effusion. Moderate  thoracic aortic atherosclerotic calcifications. The distal esophagus is grossly normal. There is a small hiatal hernia. Hepatobiliary: No focal hepatic lesions or intrahepatic biliary dilatation. The gallbladder demonstrates small gallstones but no findings for acute cholecystitis. No common bile duct dilatation. Pancreas: No mass, inflammation or ductal dilatation. Spleen: Normal size.  No focal lesions. Adrenals/Urinary Tract: The adrenal glands and kidneys are unremarkable. Small scattered renal cysts are stable. No worrisome renal lesions or hydronephrosis. Stomach/Bowel: The stomach, duodenum, small bowel and colon grossly normal. No inflammatory changes, mass lesions or obstructive findings. Vascular/Lymphatic: The aorta demonstrates advanced atherosclerotic calcifications. Calcifications at the main branch vessel ostia. No aneurysm or dissection. The major venous structures are patent. No enlarged mesenteric or retroperitoneal lymph nodes. Other: There is diffuse inflammation/edema involving the subcutaneous fat of the right abdominal wall along with skin thickening. No discrete rim enhancing fluid collection to suggest an abscess. This is likely severe focal cellulitis. The underlying abdominal musculature appears normal. No findings for myofasciitis. Musculoskeletal: No significant bony findings. Vertebral augmentation changes are noted at L1. IMPRESSION: 1. Significant changes of cellulitis involving the right abdominal wall subcutaneous fat. Marked inflammation, soft tissue swelling and skin thickening. No discrete drainable abscess. No underlying myofasciitis. 2. No significant abdominal findings. 3. Advanced atherosclerotic calcifications involving the aorta and branch vessels. No aneurysm or dissection. The Electronically Signed   By: Rudie Meyer M.D.   On: 10/13/2015 16:23    EKG:   Orders placed or performed during the hospital encounter of 03/21/15  . EKG 12-Lead  . EKG 12-Lead     IMPRESSION AND PLAN:   Randal Yepiz  is a 75 y.o. female with a known history of hypertension, non-insulin-dependent diabetes mellitus, hyperlipidemia presents to the hospital secondary to right lower quadrant abdominal wall cellulitis.  #1 Severe abdominal wall cellulitis- no abscess noted on CT abd - Elevated wbc. Admit for IV ABX- started on vancomycin and Zosyn. -Surgical consult to see if she would need any debrided meant if not responding to antibiotics. -IV fluids, pain medications  #2 hypokalemia-potassium supplements  #3 hypertension-continue Norvasc, lisinopril. Monitor potassium levels. Hold hydrochlorothiazide  #4 uncontrolled diabetes mellitus-hold metformin due to contrast enhanced CT done today. -Check A1c. Continue glyburide. -Sliding scale insulin. If sugars are elevated, will start Lantus.  #5 DVT prophylaxis-on Lovenox   All the records are reviewed and case discussed with ED provider. Management plans discussed with the patient, family and they are in agreement.  CODE STATUS: Full Code  TOTAL TIME TAKING CARE OF THIS PATIENT:50 minutes.    Enid Baas M.D on 10/13/2015 at 5:35 PM  Between 7am to 6pm - Pager - [phone]  After 6pm go to [email or web address] - password [email or web address] [facility]  [facility]  Office  [phone]  CC: Primary care physician; Lynnea Ferrier, MD

## 2015-10-13 NOTE — Progress Notes (Signed)
Pharmacy Antibiotic Note  Cheryl Mueller is a 75 y.o. female admitted on 10/13/2015 with abdominal wall cellulitus.  Pharmacy has been consulted for vancomcyin and Zosyn dosing.  Plan: DW: 52kg    CrCl: 6750ml/min    T1/2: 15    Ke: 0.046   Vd: 36  Will start patient on Vancomycin 750mg  every 24 hours with 12 hour stack dosing. Estimated trough at Css 14. Trough ordered prior to 4th dose.  Will start patient on Zosyn 3.375 IV EI every 8 hours.   Height: 4\' 11"  (149.9 cm) Weight: 137 lb (62.143 kg) IBW/kg (Calculated) : 43.2  Temp (24hrs), Avg:98.8 F (37.1 C), Min:98.8 F (37.1 C), Max:98.8 F (37.1 C)   Recent Labs Lab 10/13/15 1818  WBC 21.2*  CREATININE 0.53    Estimated Creatinine Clearance: 48 mL/min (by C-G formula based on Cr of 0.53).    Allergies  Allergen Reactions  . Alendronate Other (See Comments)    Reaction:  Leg pain   . Atorvastatin Rash  . Formaldehyde Rash    Antimicrobials this admission: 10/13/15 Zosyn >>  10/13/15 Vancomycin >>    Microbiology results:   Thank you for allowing pharmacy to be a part of this patient's care.  Cheryl Mueller, PharmD Pharmacy Resident  10/13/2015 6:54 PM

## 2015-10-14 DIAGNOSIS — L03311 Cellulitis of abdominal wall: Principal | ICD-10-CM

## 2015-10-14 LAB — BASIC METABOLIC PANEL
ANION GAP: 7 (ref 5–15)
BUN: 12 mg/dL (ref 6–20)
CHLORIDE: 106 mmol/L (ref 101–111)
CO2: 24 mmol/L (ref 22–32)
CREATININE: 0.55 mg/dL (ref 0.44–1.00)
Calcium: 8.7 mg/dL — ABNORMAL LOW (ref 8.9–10.3)
GFR calc non Af Amer: >60 mL/min (ref >60)
Glucose, Bld: 166 mg/dL — ABNORMAL HIGH (ref 65–99)
POTASSIUM: 3 mmol/L — AB (ref 3.5–5.1)
SODIUM: 137 mmol/L (ref 135–145)

## 2015-10-14 LAB — CBC
HCT: 30.1 % — ABNORMAL LOW (ref 35.0–47.0)
HEMOGLOBIN: 10.2 g/dL — AB (ref 12.0–16.0)
MCH: 29.2 pg (ref 26.0–34.0)
MCHC: 33.8 g/dL (ref 32.0–36.0)
MCV: 86.4 fL (ref 80.0–100.0)
PLATELETS: 186 10*3/uL (ref 150–440)
RBC: 3.49 MIL/uL — AB (ref 3.80–5.20)
RDW: 13 % (ref 11.5–14.5)
WBC: 17.8 10*3/uL — ABNORMAL HIGH (ref 3.6–11.0)

## 2015-10-14 LAB — GLUCOSE, CAPILLARY
GLUCOSE-CAPILLARY: 184 mg/dL — AB (ref 65–99)
Glucose-Capillary: 157 mg/dL — ABNORMAL HIGH (ref 65–99)
Glucose-Capillary: 232 mg/dL — ABNORMAL HIGH (ref 65–99)
Glucose-Capillary: 183 mg/dL — ABNORMAL HIGH (ref 65–99)

## 2015-10-14 LAB — HEMOGLOBIN A1C: HEMOGLOBIN A1C: 7.9 % — AB (ref 4.0–6.0)

## 2015-10-14 MED ORDER — POTASSIUM CHLORIDE CRYS ER 20 MEQ PO TBCR
40.0000 meq | EXTENDED_RELEASE_TABLET | ORAL | Status: AC
  Administered 2015-10-14 (×2): 40 meq via ORAL
  Filled 2015-10-14 (×2): qty 2

## 2015-10-14 NOTE — Consult Note (Signed)
Patient ID: Cheryl Mueller, female   DOB: 1941/02/05, 75 y.o.   MRN: [id number]  CC: Abdominal wall infection  HPI Cheryl Mueller is a 75 y.o. female who is admitted to the medicine service with a right lower abdominal wall swelling with erythema. Consult requested by Dr. Nemiah CommanderKalisetti for evaluation for possible need of surgical drainage. Patient reports feeling better this morning she did on admission. She also states that the area of concern has decreased dramatically in size and symptoms. Per the patient she first noted a lump on her abdomen about 3 days ago. She does not remember any trauma or injury to the area. Starting that time she started having spreading redness and swelling which prompted her to go see her doctor. Dr. ordered a CT scan and directly admitted her due to concerns for cellulitis and possible abscess. She is a known diabetic with has not been on insulin. She denies any fevers or chills, nausea, vomiting, diarrhea, constipation. Her only complaint is of headache and pain at the area of cellulitis.  HPI  Past Medical History  Diagnosis Date  . Hypertension   . Diabetes mellitus without complication (HCC)     Non Insulin dependant  . TIA (transient ischemic attack)     approx 15 years ago  . Vertigo     hx of  . Hyperlipemia     Past Surgical History  Procedure Laterality Date  . Colonoscopy w/ polypectomy    . Lumbar laminectomy/decompression microdiscectomy Right 11/26/2012    Procedure: LUMBAR LAMINECTOMY/DECOMPRESSION MICRODISCECTOMY 1 LEVEL;  Surgeon: Hewitt Shortsobert W Nudelman, MD;  Location: [facility];  Service: Neurosurgery;  Laterality: Right;  Lumbar five-sacral one laminotomy and microdiskectomy   . Orif patella Left 03/23/2015    Procedure: OPEN REDUCTION INTERNAL (ORIF) FIXATION PATELLA;  Surgeon: Kennedy BuckerMichael Menz, MD;  Location: [facility] ORS;  Service: Orthopedics;  Laterality: Left;  Marland Kitchen. Kyphoplasty N/A 04/19/2015    Procedure: KYPHOPLASTY L 1;  Surgeon: Kennedy BuckerMichael Menz, MD;   Location: [facility] ORS;  Service: Orthopedics;  Laterality: N/A;    Family History  Problem Relation Age of Onset  . Stroke Mother   . Breast cancer Neg Hx   . Hypertension Mother   . Diabetes Father     Social History Social History  Substance Use Topics  . Smoking status: Never Smoker   . Smokeless tobacco: Never Used  . Alcohol Use: No    Allergies  Allergen Reactions  . Alendronate Other (See Comments)    Reaction:  Leg pain   . Atorvastatin Rash  . Formaldehyde Rash    Current Facility-Administered Medications  Medication Dose Route Frequency Provider Last Rate Last Dose  . 0.9 % NaCl with KCl 20 mEq/ L  infusion   Intravenous Continuous Enid Baasadhika Kalisetti, MD 75 mL/hr at 10/14/15 [phone]    . acetaminophen (TYLENOL) tablet 650 mg  650 mg Oral Q6H PRN Enid Baasadhika Kalisetti, MD   650 mg at 10/13/15 2304   Or  . acetaminophen (TYLENOL) suppository 650 mg  650 mg Rectal Q6H PRN Enid Baasadhika Kalisetti, MD      . amLODipine (NORVASC) tablet 10 mg  10 mg Oral Daily Enid Baasadhika Kalisetti, MD   10 mg at 10/14/15 0839  . aspirin EC tablet 81 mg  81 mg Oral Daily Enid Baasadhika Kalisetti, MD   81 mg at 10/14/15 0839  . calcium-vitamin D (OSCAL WITH D) 500-200 MG-UNIT per tablet 1 tablet  1 tablet Oral BID Enid Baasadhika Kalisetti, MD   1 tablet at  10/14/15 0960  . docusate sodium (COLACE) capsule 100 mg  100 mg Oral BID Enid Baas, MD   100 mg at 10/14/15 0839  . enoxaparin (LOVENOX) injection 40 mg  40 mg Subcutaneous Q24H Enid Baas, MD   40 mg at 10/13/15 1909  . ferrous sulfate tablet 325 mg  325 mg Oral Q lunch Enid Baas, MD      . folic acid (FOLVITE) tablet 1 mg  1 mg Oral Daily Enid Baas, MD   1 mg at 10/14/15 0839  . glimepiride (AMARYL) tablet 4 mg  4 mg Oral Q breakfast Enid Baas, MD   4 mg at 10/14/15 0839  . HYDROcodone-acetaminophen (NORCO) 7.5-325 MG per tablet 1 tablet  1 tablet Oral Q6H PRN Enid Baas, MD   1 tablet at 10/14/15 4540  . insulin aspart  (novoLOG) injection 0-5 Units  0-5 Units Subcutaneous QHS Enid Baas, MD   3 Units at 10/13/15 2207  . insulin aspart (novoLOG) injection 0-9 Units  0-9 Units Subcutaneous TID WC Enid Baas, MD   2 Units at 10/14/15 0837  . lisinopril (PRINIVIL,ZESTRIL) tablet 40 mg  40 mg Oral BID Enid Baas, MD   40 mg at 10/14/15 9811  . meloxicam (MOBIC) tablet 7.5 mg  7.5 mg Oral Daily Enid Baas, MD   7.5 mg at 10/14/15 0839  . morphine 2 MG/ML injection 2 mg  2 mg Intravenous Q4H PRN Enid Baas, MD   2 mg at 10/13/15 2207  . ondansetron (ZOFRAN) tablet 4 mg  4 mg Oral Q6H PRN Enid Baas, MD       Or  . ondansetron (ZOFRAN) injection 4 mg  4 mg Intravenous Q6H PRN Enid Baas, MD      . piperacillin-tazobactam (ZOSYN) IVPB 3.375 g  3.375 g Intravenous Q8H Sheema M Hallaji, RPH   3.375 g at 10/14/15 9147  . potassium chloride SA (K-DUR,KLOR-CON) CR tablet 40 mEq  40 mEq Oral Q4H Enid Baas, MD   40 mEq at 10/14/15 8295  . rOPINIRole (REQUIP) tablet 0.25 mg  0.25 mg Oral QHS Enid Baas, MD   0.25 mg at 10/13/15 2159  . traZODone (DESYREL) tablet 25 mg  25 mg Oral QHS PRN Enid Baas, MD   25 mg at 10/13/15 2200  . vancomycin (VANCOCIN) IVPB 750 mg/150 ml premix  750 mg Intravenous Q24H Sheema M Hallaji, RPH         Review of Systems A Multi-point review of systems was asked and was negative except for the findings documented in the history of present illness  Physical Exam Blood pressure 142/50, pulse 91, temperature 99.7 F (37.6 C), temperature source Oral, resp. rate 16, height  (1.499 m), weight 62.143 kg (137 lb), SpO2 89 %. CONSTITUTIONAL: No acute distress. EYES: Pupils are equal, round, and reactive to light, Sclera are non-icteric. EARS, NOSE, MOUTH AND THROAT: The oropharynx is clear. The oral mucosa is pink and moist. Hearing is intact to voice. LYMPH NODES:  Lymph nodes in the neck are normal. RESPIRATORY:  Lungs are  clear. There is normal respiratory effort, with equal breath sounds bilaterally, and without pathologic use of accessory muscles. CARDIOVASCULAR: Heart is regular without murmurs, gallops, or rubs. GI: The abdomen is soft, minimally tender to palpation at the site of infection, and nondistended. There are no palpable masses. There is no hepatosplenomegaly. There are normal bowel sounds in all quadrants. The marked area of previous erythema is visualized. There is markedly retreating of erythema from  the marked borders. There is a small area of palpable induration but no fluctuance on exam. GU: Rectal deferred.   MUSCULOSKELETAL: Normal muscle strength and tone. No cyanosis or edema.   SKIN: Turgor is good and there are no pathologic skin lesions or ulcers. NEUROLOGIC: Motor and sensation is grossly normal. Cranial nerves are grossly intact. PSYCH:  Oriented to person, place and time. Affect is normal.  Data Reviewed Images and labs reviewed white blood cell count is still elevated at 17.8 down from admission at 21.2. CT scan reviewed which does show evidence of the cellulitis of the abdominal wall but without any evidence of fluid collection or abscess cavity. I have personally reviewed the patient's imaging, laboratory findings and medical records.    Assessment    75 year old female with abdominal wall cellulitis    Plan    Abdominal wall cellulitis. No evidence of abscess on exam or CT scan. Although she denies any recollection of trauma this is still likely from trauma to the skin causing cellulitis. The marked area of previous cellulitis appears to show rapid resolution of the erythema. No current indications for any surgical intervention at this time. Should her cellulitis failed to continue to improve or should she develop an area of fluctuance then drainage might be considered. Please call surgery again should she fail to continue to resolve.     Time spent with the patient was 30  minutes, with more than 50% of the time spent in face-to-face education, counseling and care coordination.     Ricarda Frame, MD FACS General Surgeon 10/14/2015, 9:25 AM

## 2015-10-14 NOTE — Progress Notes (Signed)
[facility] - [place] at [facility]   PATIENT NAME: Cheryl Mueller    MR#:  [id number]  DATE OF BIRTH:  06-Feb-1941  SUBJECTIVE:  CHIEF COMPLAINT:  No chief complaint on file.  - Patient with abdominal wall cellulitis. Started on IV antibiotics. Improving abdominal pain. -White count is improving as well. Continue IV antibiotics today  REVIEW OF SYSTEMS:  Review of Systems  Constitutional: Positive for malaise/fatigue. Negative for fever and chills.  Respiratory: Negative for cough, shortness of breath and wheezing.   Cardiovascular: Negative for chest pain and palpitations.  Gastrointestinal: Positive for abdominal pain. Negative for nausea, vomiting, diarrhea and constipation.  Genitourinary: Negative for dysuria.  Neurological: Negative for dizziness, sensory change, speech change, focal weakness, seizures and headaches.    DRUG ALLERGIES:   Allergies  Allergen Reactions  . Alendronate Other (See Comments)    Reaction:  Leg pain   . Atorvastatin Rash  . Formaldehyde Rash    VITALS:  Blood pressure 142/50, pulse 91, temperature 99.7 F (37.6 C), temperature source Oral, resp. rate 16, height  (1.499 m), weight 62.143 kg (137 lb), SpO2 89 %.  PHYSICAL EXAMINATION:  Physical Exam  GENERAL: 75 y.o.-year-old patient lying in the bed with no acute distress.  EYES: Pupils equal, round, reactive to light and accommodation. No scleral icterus. Extraocular muscles intact.  HEENT: Head atraumatic, normocephalic. Oropharynx and nasopharynx clear.  NECK: Supple, no jugular venous distention. No thyroid enlargement, no tenderness.  LUNGS: Normal breath sounds bilaterally, no wheezing, rales,rhonchi or crepitation. No use of accessory muscles of respiration. Decreased bibasilar breath sounds. CARDIOVASCULAR: S1, S2 normal. No murmurs, rubs, or gallops.  ABDOMEN: Soft, improving RLQ erythema and swelling - outlined. Other than that spot, it is  nontender, nondistended. Bowel sounds present. No organomegaly or mass.  EXTREMITIES: No pedal edema, cyanosis, or clubbing.  NEUROLOGIC: Cranial nerves II through XII are intact. Muscle strength 5/5 in all extremities. Sensation intact. Gait not checked.  PSYCHIATRIC: The patient is alert and oriented x 3.  SKIN: No obvious rash, lesion, or ulcer.   LABORATORY PANEL:   CBC  Recent Labs Lab 10/14/15 0547  WBC 17.8*  HGB 10.2*  HCT 30.1*  PLT 186   ------------------------------------------------------------------------------------------------------------------  Chemistries   Recent Labs Lab 10/14/15 0547  NA 137  K 3.0*  CL 106  CO2 24  GLUCOSE 166*  BUN 12  CREATININE 0.55  CALCIUM 8.7*   ------------------------------------------------------------------------------------------------------------------  Cardiac Enzymes No results for input(s): TROPONINI in the last 168 hours. ------------------------------------------------------------------------------------------------------------------  RADIOLOGY:  Ct Abdomen W Contrast  10/13/2015  CLINICAL DATA:  Pain, redness, swelling and tenderness involving the right lower abdominal wall. No known injury. EXAM: CT ABDOMEN WITH CONTRAST TECHNIQUE: Multidetector CT imaging of the abdomen was performed using the standard protocol following bolus administration of intravenous contrast. CONTRAST:  ISOVUE-300 IOPAMIDOL (ISOVUE-300) INJECTION 61% COMPARISON:  04/17/2015 FINDINGS: Lower chest: The lung bases are clear of acute process. Mild scarring changes and dependent subpleural atelectasis. The heart is normal in size. No pericardial effusion. Moderate thoracic aortic atherosclerotic calcifications. The distal esophagus is grossly normal. There is a small hiatal hernia. Hepatobiliary: No focal hepatic lesions or intrahepatic biliary dilatation. The gallbladder demonstrates small gallstones but no findings for acute  cholecystitis. No common bile duct dilatation. Pancreas: No mass, inflammation or ductal dilatation. Spleen: Normal size.  No focal lesions. Adrenals/Urinary Tract: The adrenal glands and kidneys are unremarkable. Small scattered renal cysts are stable. No worrisome renal lesions  or hydronephrosis. Stomach/Bowel: The stomach, duodenum, small bowel and colon grossly normal. No inflammatory changes, mass lesions or obstructive findings. Vascular/Lymphatic: The aorta demonstrates advanced atherosclerotic calcifications. Calcifications at the main branch vessel ostia. No aneurysm or dissection. The major venous structures are patent. No enlarged mesenteric or retroperitoneal lymph nodes. Other: There is diffuse inflammation/edema involving the subcutaneous fat of the right abdominal wall along with skin thickening. No discrete rim enhancing fluid collection to suggest an abscess. This is likely severe focal cellulitis. The underlying abdominal musculature appears normal. No findings for myofasciitis. Musculoskeletal: No significant bony findings. Vertebral augmentation changes are noted at L1. IMPRESSION: 1. Significant changes of cellulitis involving the right abdominal wall subcutaneous fat. Marked inflammation, soft tissue swelling and skin thickening. No discrete drainable abscess. No underlying myofasciitis. 2. No significant abdominal findings. 3. Advanced atherosclerotic calcifications involving the aorta and branch vessels. No aneurysm or dissection. The Electronically Signed   By: Rudie MeyerP.  Gallerani M.D.   On: 10/13/2015 16:23    EKG:   Orders placed or performed during the hospital encounter of 03/21/15  . EKG 12-Lead  . EKG 12-Lead    ASSESSMENT AND PLAN:   Cheryl Mueller is a 75 y.o. female with a known history of hypertension, non-insulin-dependent diabetes mellitus, hyperlipidemia presents to the hospital secondary to right lower quadrant abdominal wall cellulitis.  #1 Severe abdominal wall  cellulitis- no abscess noted on CT abd - Elevated wbc. Improving now - started on vancomycin and Zosyn. -Appreciate Surgical consult - no surgical needs at this time -IV fluids, pain medications  #2 hypokalemia-potassium supplements- being replaced again today  #3 hypertension-continue Norvasc, lisinopril. Monitor potassium levels. Hold hydrochlorothiazide  #4 uncontrolled diabetes mellitus-hold metformin due to contrast enhanced CT done on adm yesterday. - A1c of 7.9. Continue glyburide. Sugars are <200 -Sliding scale insulin.  -If sugars are elevated, will start Lantus.  #5 DVT prophylaxis-on Lovenox    All the records are reviewed and case discussed with Care Management/Social Workerr. Management plans discussed with the patient, family and they are in agreement.  CODE STATUS: Full code  TOTAL TIME TAKING CARE OF THIS PATIENT: 37 minutes.   POSSIBLE D/C IN 1-2 DAYS, DEPENDING ON CLINICAL CONDITION.   Enid BaasKALISETTI,Jonathon Castelo M.D on 10/14/2015 at 8:58 AM  Between 7am to 6pm - Pager - [phone]  After 6pm go to [email or web address] - password [email or web address] [facility]  [facility]  Office  [phone]  CC: Primary care physician; Lynnea FerrierBERT J KLEIN III, MD

## 2015-10-15 LAB — CBC
HEMATOCRIT: 30.5 % — AB (ref 35.0–47.0)
HEMOGLOBIN: 10.4 g/dL — AB (ref 12.0–16.0)
MCH: 30.2 pg (ref 26.0–34.0)
MCHC: 34.3 g/dL (ref 32.0–36.0)
MCV: 88 fL (ref 80.0–100.0)
PLATELETS: 210 10*3/uL (ref 150–440)
RBC: 3.46 MIL/uL — AB (ref 3.80–5.20)
RDW: 12.9 % (ref 11.5–14.5)
WBC: 14.4 10*3/uL — AB (ref 3.6–11.0)

## 2015-10-15 LAB — BASIC METABOLIC PANEL
Anion gap: 6 (ref 5–15)
BUN: 9 mg/dL (ref 6–20)
CALCIUM: 8.3 mg/dL — AB (ref 8.9–10.3)
CHLORIDE: 109 mmol/L (ref 101–111)
CO2: 23 mmol/L (ref 22–32)
CREATININE: 0.46 mg/dL (ref 0.44–1.00)
GFR calc non Af Amer: >60 mL/min (ref >60)
Glucose, Bld: 131 mg/dL — ABNORMAL HIGH (ref 65–99)
Potassium: 3.8 mmol/L (ref 3.5–5.1)
SODIUM: 138 mmol/L (ref 135–145)

## 2015-10-15 LAB — GLUCOSE, CAPILLARY
GLUCOSE-CAPILLARY: 141 mg/dL — AB (ref 65–99)
GLUCOSE-CAPILLARY: 155 mg/dL — AB (ref 65–99)
Glucose-Capillary: 185 mg/dL — ABNORMAL HIGH (ref 65–99)
Glucose-Capillary: 166 mg/dL — ABNORMAL HIGH (ref 65–99)

## 2015-10-15 NOTE — Progress Notes (Signed)
CC: Continued abdominal pain Subjective: Patient reports feeling somewhat better this morning however states that the pain near the sites for the revision of the infection persist. She denies any fevers, chills, nausea, vomiting.  Objective: Vital signs in last 24 hours: Temp:  [97.9 F (36.6 C)-99.2 F (37.3 C)] 98.6 F (37 C) (04/23 0543) Pulse Rate:  [83-95] 95 (04/23 0543) Resp:  [16-24] 24 (04/23 0543) BP: (107-165)/(43-79) 133/58 mmHg (04/23 0856) SpO2:  [91 %-99 %] 99 % (04/23 0543) Last BM Date: 10/15/15  Intake/Output from previous day: 04/22 0701 - 04/23 0700 In: 3364.5 [P.O.:720; I.V.:2488.1; IV Piggyback:156.4] Out: 800 [Urine:800] Intake/Output this shift:    Physical exam:  Gen.: No acute distress Chest: Clear to auscultation Heart: Regular rhythm Abdomen: Soft, nondistended, minimally tender to palpation to the superficial areas of previous concern for infection. The erythema has remained reduced however the central induration has actually increased today. No fluctuance on exam today.  Lab Results: CBC   Recent Labs  10/14/15 0547 10/15/15 0501  WBC 17.8* 14.4*  HGB 10.2* 10.4*  HCT 30.1* 30.5*  PLT 186 210   BMET  Recent Labs  10/14/15 0547 10/15/15 0501  NA 137 138  K 3.0* 3.8  CL 106 109  CO2 24 23  GLUCOSE 166* 131*  BUN 12 9  CREATININE 0.55 0.46  CALCIUM 8.7* 8.3*   PT/INR No results for input(s): LABPROT, INR in the last 72 hours. ABG No results for input(s): PHART, HCO3 in the last 72 hours.  Invalid input(s): PCO2, PO2  Studies/Results: Ct Abdomen W Contrast  10/13/2015  CLINICAL DATA:  Pain, redness, swelling and tenderness involving the right lower abdominal wall. No known injury. EXAM: CT ABDOMEN WITH CONTRAST TECHNIQUE: Multidetector CT imaging of the abdomen was performed using the standard protocol following bolus administration of intravenous contrast. CONTRAST:  ISOVUE-300 IOPAMIDOL (ISOVUE-300) INJECTION 61%  COMPARISON:  04/17/2015 FINDINGS: Lower chest: The lung bases are clear of acute process. Mild scarring changes and dependent subpleural atelectasis. The heart is normal in size. No pericardial effusion. Moderate thoracic aortic atherosclerotic calcifications. The distal esophagus is grossly normal. There is a small hiatal hernia. Hepatobiliary: No focal hepatic lesions or intrahepatic biliary dilatation. The gallbladder demonstrates small gallstones but no findings for acute cholecystitis. No common bile duct dilatation. Pancreas: No mass, inflammation or ductal dilatation. Spleen: Normal size.  No focal lesions. Adrenals/Urinary Tract: The adrenal glands and kidneys are unremarkable. Small scattered renal cysts are stable. No worrisome renal lesions or hydronephrosis. Stomach/Bowel: The stomach, duodenum, small bowel and colon grossly normal. No inflammatory changes, mass lesions or obstructive findings. Vascular/Lymphatic: The aorta demonstrates advanced atherosclerotic calcifications. Calcifications at the main branch vessel ostia. No aneurysm or dissection. The major venous structures are patent. No enlarged mesenteric or retroperitoneal lymph nodes. Other: There is diffuse inflammation/edema involving the subcutaneous fat of the right abdominal wall along with skin thickening. No discrete rim enhancing fluid collection to suggest an abscess. This is likely severe focal cellulitis. The underlying abdominal musculature appears normal. No findings for myofasciitis. Musculoskeletal: No significant bony findings. Vertebral augmentation changes are noted at L1. IMPRESSION: 1. Significant changes of cellulitis involving the right abdominal wall subcutaneous fat. Marked inflammation, soft tissue swelling and skin thickening. No discrete drainable abscess. No underlying myofasciitis. 2. No significant abdominal findings. 3. Advanced atherosclerotic calcifications involving the aorta and branch vessels. No aneurysm or  dissection. The Electronically Signed   By: Rudie Meyer M.D.   On: 10/13/2015 16:23  Anti-infectives: Anti-infectives    Start     Dose/Rate Route Frequency Ordered Stop   10/14/15 0800  vancomycin (VANCOCIN) IVPB 750 mg/150 ml premix     750 mg 150 mL/hr over 60 Minutes Intravenous Every 24 hours 10/13/15 1854     10/13/15 1900  piperacillin-tazobactam (ZOSYN) IVPB 3.375 g     3.375 g 12.5 mL/hr over 240 Minutes Intravenous Every 8 hours 10/13/15 1847     10/13/15 1900  vancomycin (VANCOCIN) IVPB 1000 mg/200 mL premix     1,000 mg 200 mL/hr over 60 Minutes Intravenous  Once 10/13/15 1854 10/13/15 2132      Assessment/Plan:  75 year old female admitted with abdominal wall cellulitis. White cell count is gradually decreasing towards normal. The erythema has remained were treated but the induration has increased. No definite physical exam findings of fluctuance for abscess for drainage. However surgery will continue to follow today as these may progress to abscesses requiring surgical drainage.We will make patient nothing by mouth at midnight tonight in case she progresses to needing incision and drainage tomorrow morning.  Ama Mcmaster T. Tonita CongWoodham, MD, FACS  10/15/2015

## 2015-10-15 NOTE — Progress Notes (Signed)
[facility] - [place] [facility]   PATIENT NAME: Cheryl Mueller    MR#:  [id number]  DATE OF BIRTH:  [birth year]  SUBJECTIVE:  CHIEF COMPLAINT:  No chief complaint on file.  - Patient with abdominal wall cellulitis. on IV antibiotics.  - Still has moderate abdominal pain. Occasional nausea causing to have poor appetite. -White count is improving. Cellulitis is getting smaller but more erythematous in the center. NPO after midnight for possible drainage if needed.  REVIEW OF SYSTEMS:  Review of Systems  Constitutional: Positive for malaise/fatigue. Negative for fever and chills.  Respiratory: Negative for cough, shortness of breath and wheezing.   Cardiovascular: Negative for chest pain and palpitations.  Gastrointestinal: Positive for abdominal pain. Negative for nausea, vomiting, diarrhea and constipation.  Genitourinary: Negative for dysuria.  Neurological: Negative for dizziness, sensory change, speech change, focal weakness, seizures and headaches.    DRUG ALLERGIES:   Allergies  Allergen Reactions  . Alendronate Other (See Comments)    Reaction:  Leg pain   . Atorvastatin Rash  . Formaldehyde Rash    VITALS:  Blood pressure 133/58, pulse 95, temperature 98.6 F (37 C), temperature source Oral, resp. rate 24, height 4\' 11"  (1.499 m), weight 62.143 kg (137 lb), SpO2 99 %.  PHYSICAL EXAMINATION:  Physical Exam  GENERAL: 75 y.o.-year-old patient lying in the bed with no acute distress.  EYES: Pupils equal, round, reactive to light and accommodation. No scleral icterus. Extraocular muscles intact.  HEENT: Head atraumatic, normocephalic. Oropharynx and nasopharynx clear.  NECK: Supple, no jugular venous distention. No thyroid enlargement, no tenderness.  LUNGS: Normal breath sounds bilaterally, no wheezing, rales,rhonchi or crepitation. No use of accessory muscles of respiration. Decreased bibasilar breath sounds. CARDIOVASCULAR: S1, S2  normal. No murmurs, rubs, or gallops.  ABDOMEN: Soft, improving RLQ erythema and swelling - outlined. More red and tender in the center of the lesion now. Other than that spot, it is nontender, nondistended. Bowel sounds present. No organomegaly or mass.  EXTREMITIES: No pedal edema, cyanosis, or clubbing.  NEUROLOGIC: Cranial nerves II through XII are intact. Muscle strength 5/5 in all extremities. Sensation intact. Gait not checked.  PSYCHIATRIC: The patient is alert and oriented x 3.  SKIN: No obvious rash, lesion, or ulcer.   LABORATORY PANEL:   CBC  Recent Labs Lab 10/15/15 0501  WBC 14.4*  HGB 10.4*  HCT 30.5*  PLT 210   ------------------------------------------------------------------------------------------------------------------  Chemistries   Recent Labs Lab 10/15/15 0501  NA 138  K 3.8  CL 109  CO2 23  GLUCOSE 131*  BUN 9  CREATININE 0.46  CALCIUM 8.3*   ------------------------------------------------------------------------------------------------------------------  Cardiac Enzymes No results for input(s): TROPONINI in the last 168 hours. ------------------------------------------------------------------------------------------------------------------  RADIOLOGY:  Ct Abdomen W Contrast  10/13/2015  CLINICAL DATA:  Pain, redness, swelling and tenderness involving the right lower abdominal wall. No known injury. EXAM: CT ABDOMEN WITH CONTRAST TECHNIQUE: Multidetector CT imaging of the abdomen was performed using the standard protocol following bolus administration of intravenous contrast. CONTRAST:  100mL ISOVUE-300 IOPAMIDOL (ISOVUE-300) INJECTION 61% COMPARISON:  04/17/2015 FINDINGS: Lower chest: The lung bases are clear of acute process. Mild scarring changes and dependent subpleural atelectasis. The heart is normal in size. No pericardial effusion. Moderate thoracic aortic atherosclerotic calcifications. The distal esophagus is grossly normal. There is  a small hiatal hernia. Hepatobiliary: No focal hepatic lesions or intrahepatic biliary dilatation. The gallbladder demonstrates small gallstones but no findings for acute cholecystitis. No common bile duct dilatation.  Pancreas: No mass, inflammation or ductal dilatation. Spleen: Normal size.  No focal lesions. Adrenals/Urinary Tract: The adrenal glands and kidneys are unremarkable. Small scattered renal cysts are stable. No worrisome renal lesions or hydronephrosis. Stomach/Bowel: The stomach, duodenum, small bowel and colon grossly normal. No inflammatory changes, mass lesions or obstructive findings. Vascular/Lymphatic: The aorta demonstrates advanced atherosclerotic calcifications. Calcifications at the main branch vessel ostia. No aneurysm or dissection. The major venous structures are patent. No enlarged mesenteric or retroperitoneal lymph nodes. Other: There is diffuse inflammation/edema involving the subcutaneous fat of the right abdominal wall along with skin thickening. No discrete rim enhancing fluid collection to suggest an abscess. This is likely severe focal cellulitis. The underlying abdominal musculature appears normal. No findings for myofasciitis. Musculoskeletal: No significant bony findings. Vertebral augmentation changes are noted at L1. IMPRESSION: 1. Significant changes of cellulitis involving the right abdominal wall subcutaneous fat. Marked inflammation, soft tissue swelling and skin thickening. No discrete drainable abscess. No underlying myofasciitis. 2. No significant abdominal findings. 3. Advanced atherosclerotic calcifications involving the aorta and branch vessels. No aneurysm or dissection. The Electronically Signed   By: Rudie Meyer M.D.   On: 10/13/2015 16:23    EKG:   Orders placed or performed during the hospital encounter of 03/21/15  . EKG 12-Lead  . EKG 12-Lead    ASSESSMENT AND PLAN:   Cheryl Mueller is a 75 y.o. female with a known history of hypertension,  non-insulin-dependent diabetes mellitus, hyperlipidemia presents to the hospital secondary to right lower quadrant abdominal wall cellulitis.  #1 Severe abdominal wall cellulitis- no abscess noted on CT abd - Elevated wbc. Improving now - continue on vancomycin and Zosyn. -Appreciate Surgical consult - Nothing by mouth after midnight in case patient needs surgical drainage. -IV fluids, pain medications  #2 hypokalemia-potassium supplements-  replaced and much improved now  #3 hypertension-continue Norvasc, lisinopril. Monitor potassium levels. Hold hydrochlorothiazide  #4 uncontrolled diabetes mellitus-hold metformin due to contrast enhanced CT done on adm on admission. - A1c of 7.9. Continue glyburide. Sugars are <200 -Sliding scale insulin.  -Much improved sugars here.  #5 DVT prophylaxis-on Lovenox    All the records are reviewed and case discussed with Care Management/Social Workerr. Management plans discussed with the patient, family and they are in agreement.  CODE STATUS: Full code  TOTAL TIME TAKING CARE OF THIS PATIENT: 32 minutes.   POSSIBLE D/C IN 1-2 DAYS, DEPENDING ON CLINICAL CONDITION.   Enid Baas M.D on 10/15/2015 at 11:59 AM  Between 7am to 6pm - Pager - [phone]  After 6pm go to [email or web address] - password [email or web address] [facility]  [place]  [facility]  Office  [phone]  CC: Primary care physician; Lynnea Ferrier, MD

## 2015-10-16 ENCOUNTER — Inpatient Hospital Stay: Payer: Medicare Other | Admitting: Anesthesiology

## 2015-10-16 ENCOUNTER — Encounter
Admission: AD | Disposition: A | Discharge status: Home / Self Care | Payer: Self-pay | Source: Ambulatory Visit | Attending: Internal Medicine

## 2015-10-16 HISTORY — PX: DEBRIDEMENT OF ABDOMINAL WALL ABSCESS: SHX6396

## 2015-10-16 LAB — GLUCOSE, CAPILLARY
GLUCOSE-CAPILLARY: 138 mg/dL — AB (ref 65–99)
GLUCOSE-CAPILLARY: 173 mg/dL — AB (ref 65–99)
Glucose-Capillary: 150 mg/dL — ABNORMAL HIGH (ref 65–99)
Glucose-Capillary: 138 mg/dL — ABNORMAL HIGH (ref 65–99)
Glucose-Capillary: 205 mg/dL — ABNORMAL HIGH (ref 65–99)
Glucose-Capillary: 314 mg/dL — ABNORMAL HIGH (ref 65–99)

## 2015-10-16 LAB — CBC
HEMATOCRIT: 32.4 % — AB (ref 35.0–47.0)
HEMOGLOBIN: 11.1 g/dL — AB (ref 12.0–16.0)
MCH: 29.8 pg (ref 26.0–34.0)
MCHC: 34.3 g/dL (ref 32.0–36.0)
MCV: 86.8 fL (ref 80.0–100.0)
Platelets: 264 10*3/uL (ref 150–440)
RBC: 3.73 MIL/uL — ABNORMAL LOW (ref 3.80–5.20)
RDW: 13.3 % (ref 11.5–14.5)
WBC: 7.5 10*3/uL (ref 3.6–11.0)

## 2015-10-16 LAB — BASIC METABOLIC PANEL
Anion gap: 8 (ref 5–15)
CALCIUM: 8.4 mg/dL — AB (ref 8.9–10.3)
CHLORIDE: 105 mmol/L (ref 101–111)
CO2: 24 mmol/L (ref 22–32)
CREATININE: 0.34 mg/dL — AB (ref 0.44–1.00)
GFR calc non Af Amer: >60 mL/min (ref >60)
Glucose, Bld: 139 mg/dL — ABNORMAL HIGH (ref 65–99)
Potassium: 3.4 mmol/L — ABNORMAL LOW (ref 3.5–5.1)
SODIUM: 137 mmol/L (ref 135–145)

## 2015-10-16 LAB — SURGICAL PCR SCREEN
MRSA, PCR: POSITIVE — AB
STAPHYLOCOCCUS AUREUS: POSITIVE — AB

## 2015-10-16 SURGERY — DEBRIDEMENT OF ABDOMINAL WALL ABSCESS
Anesthesia: General | Site: Abdomen | Wound class: Dirty or Infected

## 2015-10-16 MED ORDER — FENTANYL CITRATE (PF) 100 MCG/2ML IJ SOLN
25.0000 ug | INTRAMUSCULAR | Status: DC | PRN
  Administered 2015-10-16 (×4): 25 ug via INTRAVENOUS

## 2015-10-16 MED ORDER — CHLORHEXIDINE GLUCONATE CLOTH 2 % EX PADS
6.0000 | MEDICATED_PAD | Freq: Every day | CUTANEOUS | Status: DC
  Administered 2015-10-17 – 2015-10-18 (×2): 6 via TOPICAL

## 2015-10-16 MED ORDER — ACETAMINOPHEN 10 MG/ML IV SOLN
INTRAVENOUS | Status: AC
  Filled 2015-10-16: qty 100

## 2015-10-16 MED ORDER — ACETAMINOPHEN 10 MG/ML IV SOLN
INTRAVENOUS | Status: DC | PRN
  Administered 2015-10-16: 1000 mg via INTRAVENOUS

## 2015-10-16 MED ORDER — SODIUM CHLORIDE 0.9 % IV SOLN
INTRAVENOUS | Status: DC | PRN
  Administered 2015-10-16: 13:00:00 via INTRAVENOUS

## 2015-10-16 MED ORDER — SODIUM CHLORIDE FLUSH 0.9 % IV SOLN
INTRAVENOUS | Status: AC
  Filled 2015-10-16: qty 20

## 2015-10-16 MED ORDER — DEXAMETHASONE SODIUM PHOSPHATE 10 MG/ML IJ SOLN
INTRAMUSCULAR | Status: DC | PRN
  Administered 2015-10-16: 10 mg via INTRAVENOUS

## 2015-10-16 MED ORDER — PROPOFOL 10 MG/ML IV BOLUS
INTRAVENOUS | Status: DC | PRN
  Administered 2015-10-16: 50 mg via INTRAVENOUS
  Administered 2015-10-16: 100 mg via INTRAVENOUS

## 2015-10-16 MED ORDER — FENTANYL CITRATE (PF) 100 MCG/2ML IJ SOLN
INTRAMUSCULAR | Status: AC
  Administered 2015-10-16: 15:00:00
  Filled 2015-10-16: qty 2

## 2015-10-16 MED ORDER — ONDANSETRON HCL 4 MG/2ML IJ SOLN
4.0000 mg | Freq: Once | INTRAMUSCULAR | Status: DC | PRN
Start: 2015-10-16 — End: 2015-10-16

## 2015-10-16 MED ORDER — MIDAZOLAM HCL 5 MG/5ML IJ SOLN
INTRAMUSCULAR | Status: DC | PRN
  Administered 2015-10-16: 2 mg via INTRAVENOUS

## 2015-10-16 MED ORDER — MUPIROCIN 2 % EX OINT
1.0000 "application " | TOPICAL_OINTMENT | Freq: Two times a day (BID) | CUTANEOUS | Status: DC
  Administered 2015-10-16 – 2015-10-18 (×5): 1 via NASAL
  Filled 2015-10-16: qty 22

## 2015-10-16 MED ORDER — FENTANYL CITRATE (PF) 100 MCG/2ML IJ SOLN
INTRAMUSCULAR | Status: DC | PRN
  Administered 2015-10-16 (×2): 50 ug via INTRAVENOUS

## 2015-10-16 SURGICAL SUPPLY — 25 items
CANISTER SUCT 1200ML W/VALVE (MISCELLANEOUS) ×3 IMPLANT
CATH TRAY 16F METER LATEX (MISCELLANEOUS) IMPLANT
DRAIN PENROSE 1/4X12 LTX (DRAIN) ×3 IMPLANT
DRAPE LAPAROTOMY 100X77 ABD (DRAPES) ×3 IMPLANT
ELECT CAUTERY NEEDLE TIP 1.0 (MISCELLANEOUS)
ELECT REM PT RETURN 9FT ADLT (ELECTROSURGICAL) ×3
ELECTRODE CAUTERY NEDL TIP 1.0 (MISCELLANEOUS) IMPLANT
ELECTRODE REM PT RTRN 9FT ADLT (ELECTROSURGICAL) ×1 IMPLANT
GAUZE SPONGE 4X4 12PLY STRL (GAUZE/BANDAGES/DRESSINGS) ×3 IMPLANT
GLOVE BIO SURGEON STRL SZ8 (GLOVE) ×12 IMPLANT
GOWN STRL REUS W/ TWL LRG LVL3 (GOWN DISPOSABLE) ×2 IMPLANT
GOWN STRL REUS W/TWL LRG LVL3 (GOWN DISPOSABLE) ×4
KIT RM TURNOVER STRD PROC AR (KITS) ×3 IMPLANT
LABEL OR SOLS (LABEL) IMPLANT
NS IRRIG 1000ML POUR BTL (IV SOLUTION) ×3 IMPLANT
PACK BASIN MAJOR ARMC (MISCELLANEOUS) ×3 IMPLANT
PAD ABD DERMACEA PRESS 5X9 (GAUZE/BANDAGES/DRESSINGS) ×3 IMPLANT
STAPLER SKIN PROX 35W (STAPLE) IMPLANT
SUT CHROMIC 0 CT 1 (SUTURE) IMPLANT
SUT CHROMIC BR 1/2CLE 2-0 54IN (SUTURE) IMPLANT
SUT ETHILON 3-0 FS-10 30 BLK (SUTURE) ×3
SUT MAXON ABS #0 GS21 30IN (SUTURE) IMPLANT
SUTURE EHLN 3-0 FS-10 30 BLK (SUTURE) ×1 IMPLANT
SWAB DUAL CULTURE TRANS RED ST (MISCELLANEOUS) ×3 IMPLANT
SYR BULB IRRIG 60ML STRL (SYRINGE) ×3 IMPLANT

## 2015-10-16 NOTE — Progress Notes (Signed)
Called Dr. Anne HahnWillis regarding advancing patient's diet to regular.  She is tolerating full liquids well.  Doctor put in appropriate orders.  Arturo MortonClay, Nicholaos Schippers N   10/16/2015  10:12 PM

## 2015-10-16 NOTE — Anesthesia Procedure Notes (Signed)
Procedure Name: LMA Insertion Date/Time: 10/16/2015 1:49 PM Performed by: Junious SilkNOLES, Dayveon Halley Pre-anesthesia Checklist: Patient identified, Patient being monitored, Timeout performed, Emergency Drugs available and Suction available Patient Re-evaluated:Patient Re-evaluated prior to inductionOxygen Delivery Method: Circle system utilized Preoxygenation: Pre-oxygenation with 100% oxygen Intubation Type: IV induction Ventilation: Mask ventilation without difficulty LMA: LMA inserted LMA Size: 3.5 Tube type: Oral Number of attempts: 1 Placement Confirmation: positive ETCO2 and breath sounds checked- equal and bilateral Tube secured with: Tape Dental Injury: Teeth and Oropharynx as per pre-operative assessment

## 2015-10-16 NOTE — Anesthesia Postprocedure Evaluation (Signed)
Anesthesia Post Note  Patient: Cheryl Mueller  Procedure(s) Performed: Procedure(s) (LRB): I & D OF ABDOMINAL WALL ABSCESS (N/A)  Patient location during evaluation: PACU Anesthesia Type: General Level of consciousness: awake and alert Pain management: pain level controlled Vital Signs Assessment: post-procedure vital signs reviewed and stable Respiratory status: spontaneous breathing and respiratory function stable Cardiovascular status: stable Anesthetic complications: no    Last Vitals:  Filed Vitals:   10/16/15 0458 10/16/15 1356  BP: 163/65 127/73  Pulse: 80 87  Temp: 36.8 C 36.6 C  Resp: 16     Last Pain:  Filed Vitals:   10/16/15 1423  PainSc: 4                  Teon Hudnall K

## 2015-10-16 NOTE — Op Note (Signed)
10/13/2015 - 10/16/2015  1:49 PM  PATIENT:  Damaris SchoonerAnn C Wilmes  75 y.o. female  PRE-OPERATIVE DIAGNOSIS: Abdominal wall abscess  POST-OPERATIVE DIAGNOSIS:  Same  PROCEDURE: Incision and drainage of abdominal wall abscess  SURGEON:  Lattie Hawichard E Robena Ewy MD, FACS   ANESTHESIA:   Gen. with LMA   Details of Procedure: This patient with abdominal wall abscess and cellulitis of cellulitis is resolving but she is coalescing into an abscess and requires incision and drainage. Preoperatively discussed rationale for surgery the options of observation risk bleeding infection recurrence open wound drain placement is all reviewed for she and her husband the preop holding area the understood and agreed to proceed.  Patient was taken to the operating room and induced general anesthesia with LMA and prepped draped sterile fashion a surgical pause was performed. A section of the area demonstrated a 1 mm necrotic centered bite or for uncle of some sort overlying an indurated area. This was incised and a cavity of pus was identified cultures were obtained a clamp was placed into the cavity and it was noted that it extended both posterior on the right flank and more anteriorly therefore 2 separate counter incisions were made. The patient was performed repeatedly until clear. A Penrose drain was placed into the cavity from both hands and brought out through the center incision and sutured in place with 3-0 nylon the Penrose was in 2 pieces one lateral and one anterior. 3-0 nylon was utilized to hold the drain in place. Attention was turned to another for local very close nearby which had expressible purulence this was incised and a small upper limits with exuded. A sterile dressing was placed covering all wounds. She was taken to recovery room in stable condition to be admitted for continued care with MRSA precautions.   Lattie Hawichard E Janayah Zavada, MD FACS

## 2015-10-16 NOTE — Progress Notes (Signed)
CC: Abdominal wall abscess Subjective: This patient with an abdominal wall abscess who states that she thinks she needs to be drained and this was a direct quote from the patient. She's had no fevers or chills but states that her pain is better but not gone and is now more focal to an area that she can almost point with her finger.  Objective: Vital signs in last 24 hours: Temp:  [98.2 F (36.8 C)] 98.2 F (36.8 C) (04/24 0458) Pulse Rate:  [80] 80 (04/24 0458) Resp:  [16-24] 16 (04/24 0458) BP: (133-163)/(58-65) 163/65 mmHg (04/24 0458) SpO2:  [95 %-96 %] 95 % (04/24 0458) Last BM Date: 10/15/15  Intake/Output from previous day: 04/23 0701 - 04/24 0700 In: 2086 [P.O.:240; I.V.:1763; IV Piggyback:83] Out: 0  Intake/Output this shift: Total I/O In: 548.8 [I.V.:198.8; IV Piggyback:350] Out: -   Physical exam:  Comfortable-appearing female patient No icterus no jaundice Abdomen is soft nondistended there is some erythema to the right side where there is an apparent wound measuring 1 mm with central necrosis and fluctuance and induration as well as tenderness surrounding this area and somewhat lateral to it. Calves are nontender  Lab Results: CBC   Recent Labs  10/15/15 0501 10/16/15 0534  WBC 14.4* 7.5  HGB 10.4* 11.1*  HCT 30.5* 32.4*  PLT 210 264   BMET  Recent Labs  10/15/15 0501 10/16/15 0534  NA 138 137  K 3.8 3.4*  CL 109 105  CO2 23 24  GLUCOSE 131* 139*  BUN 9 <5*  CREATININE 0.46 0.34*  CALCIUM 8.3* 8.4*   PT/INR No results for input(s): LABPROT, INR in the last 72 hours. ABG No results for input(s): PHART, HCO3 in the last 72 hours.  Invalid input(s): PCO2, PO2  Studies/Results: No results found.  Anti-infectives: Anti-infectives    Start     Dose/Rate Route Frequency Ordered Stop   10/14/15 0800  vancomycin (VANCOCIN) IVPB 750 mg/150 ml premix     750 mg 150 mL/hr over 60 Minutes Intravenous Every 24 hours 10/13/15 1854     10/13/15  1900  piperacillin-tazobactam (ZOSYN) IVPB 3.375 g     3.375 g 12.5 mL/hr over 240 Minutes Intravenous Every 8 hours 10/13/15 1847     10/13/15 1900  vancomycin (VANCOCIN) IVPB 1000 mg/200 mL premix     1,000 mg 200 mL/hr over 60 Minutes Intravenous  Once 10/13/15 1854 10/13/15 2132      Assessment/Plan:  Abdominal wall abscess. Blood cell count is improved but the patient seems to be: Dressing to an abscess in the right abdomen I recommended incision and drainage. The options of observation reviewed the procedure itself with either open wound packing or Penrose drain were discussed with her the risk of recurrence was discussed with her as well as cosmetics she understood and agreed to proceed.  Lattie Hawichard E Jerimey Burridge, MD, FACS  10/16/2015

## 2015-10-16 NOTE — Care Management Important Message (Signed)
Important Message  Patient Details  Name: Cheryl Mueller MRN: [id number] Date of Birth: [birth year]   Medicare Important Message Given:  Yes    Olegario MessierKathy A Chaneka Trefz 10/16/2015, 11:25 AM

## 2015-10-16 NOTE — Progress Notes (Signed)
Pharmacy Antibiotic Note  Damaris Schoonernn C Benham is a 75 y.o. female admitted on 10/13/2015 with abdominal wall cellulitus.  Pharmacy has been consulted for vancomcyin and Zosyn dosing.  Plan: DW: 52kg    CrCl: 3450ml/min    T1/2: 15    Ke: 0.046   Vd: 36  Will continue patient on Vancomycin 750mg  every 24 hours. Trough ordered prior to 4th dose on 4/25 @ 07:30.   Will continue patient on Zosyn 3.375 IV EI every 8 hours.   Height: 4\' 11"  (149.9 cm) Weight: 137 lb (62.143 kg) IBW/kg (Calculated) : 43.2  Temp (24hrs), Avg:98.2 F (36.8 C), Min:98.2 F (36.8 C), Max:98.2 F (36.8 C)   Recent Labs Lab 10/13/15 1818 10/14/15 0547 10/15/15 0501 10/16/15 0534  WBC 21.2* 17.8* 14.4* 7.5  CREATININE 0.53 0.55 0.46 0.34*    Estimated Creatinine Clearance: 47.2 mL/min (by C-G formula based on Cr of 0.34).    Allergies  Allergen Reactions  . Alendronate Other (See Comments)    Reaction:  Leg pain   . Atorvastatin Rash  . Formaldehyde Rash    Antimicrobials this admission: 10/13/15 Zosyn >>  10/13/15 Vancomycin >>    Microbiology results:   Thank you for allowing pharmacy to be a part of this patient's care. Demetrius Charityeldrin D. Cindy Brindisi, PharmD   10/16/2015 9:46 AM

## 2015-10-16 NOTE — Progress Notes (Signed)
Preoperative Review   Patient is met in the preoperative holding area. The history is reviewed in the chart and with the patient. I personally reviewed the options and rationale as well as the risks of this procedure that have been previously discussed with the patient. All questions asked by the patient and/or family were answered to their satisfaction. RN on floor notified us that the patient tested positive for MRSA and other nursing staff notified. Patient agrees to proceed with this procedure at this time.  Florene Glen M.D. FACS

## 2015-10-16 NOTE — Anesthesia Preprocedure Evaluation (Signed)
Anesthesia Evaluation  Patient identified by MRN, date of birth, ID band Patient awake    Reviewed: Allergy & Precautions, NPO status , Patient's Chart, lab work & pertinent test results  History of Anesthesia Complications Negative for: history of anesthetic complications  Airway Mallampati: II       Dental   Pulmonary neg pulmonary ROS,           Cardiovascular hypertension, Pt. on medications      Neuro/Psych TIA (numb leg x 1 day)   GI/Hepatic negative GI ROS, Neg liver ROS,   Endo/Other  negative endocrine ROSdiabetes, Type 2, Oral Hypoglycemic Agents  Renal/GU negative Renal ROS     Musculoskeletal   Abdominal   Peds  Hematology  (+) anemia ,   Anesthesia Other Findings   Reproductive/Obstetrics                             Anesthesia Physical Anesthesia Plan  ASA: II and emergent  Anesthesia Plan: General   Post-op Pain Management:    Induction: Intravenous  Airway Management Planned: LMA  Additional Equipment:   Intra-op Plan:   Post-operative Plan:   Informed Consent: I have reviewed the patients History and Physical, chart, labs and discussed the procedure including the risks, benefits and alternatives for the proposed anesthesia with the patient or authorized representative who has indicated his/her understanding and acceptance.     Plan Discussed with:   Anesthesia Plan Comments:         Anesthesia Quick Evaluation

## 2015-10-16 NOTE — Transfer of Care (Signed)
Immediate Anesthesia Transfer of Care Note  Patient: Cheryl Mueller  Procedure(s) Performed: Procedure(s): I & D OF ABDOMINAL WALL ABSCESS (N/A)  Patient Location: PACU  Anesthesia Type:General  Level of Consciousness: awake and sedated  Airway & Oxygen Therapy: Patient Spontanous Breathing and Patient connected to face mask oxygen  Post-op Assessment: Report given to RN and Post -op Vital signs reviewed and stable  Post vital signs: Reviewed and stable  Last Vitals:  Filed Vitals:   10/16/15 0458 10/16/15 1356  BP: 163/65 127/73  Pulse: 80   Temp: 36.8 C 36.6 C  Resp: 16     Complications: No apparent anesthesia complications

## 2015-10-16 NOTE — Progress Notes (Signed)
[facility] [place] [facility]   PATIENT NAME: Cheryl Mueller    MR#:  [id number]  DATE OF BIRTH:  May 02, 1941  SUBJECTIVE:  CHIEF COMPLAINT:  No chief complaint on file.  - Patient with abdominal wall cellulitis. on IV antibiotics.  Still has some pain, site looks more firm adn erythematous this AM though her wbc is normalized. -Awaiting surgical consult to see if I&D is appropriate  REVIEW OF SYSTEMS:  Review of Systems  Constitutional: Positive for malaise/fatigue. Negative for fever and chills.  Respiratory: Negative for cough, shortness of breath and wheezing.   Cardiovascular: Negative for chest pain and palpitations.  Gastrointestinal: Positive for abdominal pain. Negative for nausea, vomiting, diarrhea and constipation.  Genitourinary: Negative for dysuria.  Neurological: Negative for dizziness, sensory change, speech change, focal weakness, seizures and headaches.    DRUG ALLERGIES:   Allergies  Allergen Reactions  . Alendronate Other (See Comments)    Reaction:  Leg pain   . Atorvastatin Rash  . Formaldehyde Rash    VITALS:  Blood pressure 163/65, pulse 80, temperature 98.2 F (36.8 C), temperature source Oral, resp. rate 16, height 4\' 11"  (1.499 m), weight 62.143 kg (137 lb), SpO2 95 %.  PHYSICAL EXAMINATION:  Physical Exam  GENERAL: 75 y.o.-year-old patient lying in the bed with no acute distress.  EYES: Pupils equal, round, reactive to light and accommodation. No scleral icterus. Extraocular muscles intact.  HEENT: Head atraumatic, normocephalic. Oropharynx and nasopharynx clear.  NECK: Supple, no jugular venous distention. No thyroid enlargement, no tenderness.  LUNGS: Normal breath sounds bilaterally, no wheezing, rales,rhonchi or crepitation. No use of accessory muscles of respiration. Decreased bibasilar breath sounds. CARDIOVASCULAR: S1, S2 normal. No murmurs, rubs, or gallops.  ABDOMEN: Soft, improving RLQ erythema and  swelling - outlined. More red and tender in the center of the lesion now. Other than that spot, it is nontender, nondistended. Bowel sounds present. No organomegaly or mass.  EXTREMITIES: No pedal edema, cyanosis, or clubbing.  NEUROLOGIC: Cranial nerves II through XII are intact. Muscle strength 5/5 in all extremities. Sensation intact. Gait not checked.  PSYCHIATRIC: The patient is alert and oriented x 3.  SKIN: No obvious rash, lesion, or ulcer.   LABORATORY PANEL:   CBC  Recent Labs Lab 10/16/15 0534  WBC 7.5  HGB 11.1*  HCT 32.4*  PLT 264   ------------------------------------------------------------------------------------------------------------------  Chemistries   Recent Labs Lab 10/16/15 0534  NA 137  K 3.4*  CL 105  CO2 24  GLUCOSE 139*  BUN <5*  CREATININE 0.34*  CALCIUM 8.4*   ------------------------------------------------------------------------------------------------------------------  Cardiac Enzymes No results for input(s): TROPONINI in the last 168 hours. ------------------------------------------------------------------------------------------------------------------  RADIOLOGY:  No results found.  EKG:   Orders placed or performed during the hospital encounter of 03/21/15  . EKG 12-Lead  . EKG 12-Lead    ASSESSMENT AND PLAN:   Cheryl Marchnn Manganaro is a 75 y.o. female with a known history of hypertension, non-insulin-dependent diabetes mellitus, hyperlipidemia presents to the hospital secondary to right lower quadrant abdominal wall cellulitis.  #1 Severe abdominal wall cellulitis- no abscess noted on CT abd - Elevated wbc. Improving now - on vancomycin and Zosyn. -Appreciate Surgical consult - evaluate the need for I&D today -IV fluids, pain medications  #2 hypokalemia-potassium supplements-  replaced and much improved now  #3 hypertension-continue Norvasc, lisinopril. Monitor potassium levels. Hold hydrochlorothiazide  #4  uncontrolled diabetes mellitus-hold metformin due to contrast enhanced CT done on adm on admission. - A1c of  7.9. Continue glyburide. Sugars are <200 -Sliding scale insulin.  -Much improved sugars here.  #5 DVT prophylaxis-on Lovenox  Possible discharge in 1-2 days based on surgical plans.    All the records are reviewed and case discussed with Care Management/Social Workerr. Management plans discussed with the patient, family and they are in agreement.  CODE STATUS: Full code  TOTAL TIME TAKING CARE OF THIS PATIENT: 32 minutes.   POSSIBLE D/C IN 1-2 DAYS, DEPENDING ON CLINICAL CONDITION.   Enid Baas M.D on 10/16/2015 at 8:41 AM  Between 7am to 6pm - Pager - [phone]  After 6pm go to [email or web address] - password [email or web address] [facility]  [facility]  Office  [phone]  CC: Primary care physician; Lynnea Ferrier, MD

## 2015-10-17 ENCOUNTER — Encounter: Payer: Self-pay | Admitting: Surgery

## 2015-10-17 LAB — GLUCOSE, CAPILLARY
GLUCOSE-CAPILLARY: 339 mg/dL — AB (ref 65–99)
GLUCOSE-CAPILLARY: 196 mg/dL — AB (ref 65–99)
GLUCOSE-CAPILLARY: 170 mg/dL — AB (ref 65–99)
Glucose-Capillary: 212 mg/dL — ABNORMAL HIGH (ref 65–99)

## 2015-10-17 LAB — VANCOMYCIN, TROUGH

## 2015-10-17 MED ORDER — VANCOMYCIN HCL 500 MG IV SOLR
500.0000 mg | Freq: Two times a day (BID) | INTRAVENOUS | Status: DC
  Administered 2015-10-17 – 2015-10-18 (×2): 500 mg via INTRAVENOUS
  Filled 2015-10-17 (×5): qty 500

## 2015-10-17 MED ORDER — METFORMIN HCL 500 MG PO TABS
1000.0000 mg | ORAL_TABLET | Freq: Two times a day (BID) | ORAL | Status: DC
Start: 2015-10-17 — End: 2015-10-18
  Administered 2015-10-17 – 2015-10-18 (×2): 1000 mg via ORAL
  Filled 2015-10-17 (×2): qty 2

## 2015-10-17 NOTE — Progress Notes (Signed)
This patient status post I&D of a right abdominal wall abscess with Penrose drain placements. She states she feels much better today denies fevers or chills or pain is much improved.  Vital Signs are stable and she is afebrile Abdomen is soft nontender wound shows less erythema and draining tubes are in place without purulence.  Patient doing very well I discussed patient with prime doc and recommend that she stay on IV antibiotics another day and probably discharge tomorrow.

## 2015-10-17 NOTE — Progress Notes (Signed)
Pharmacy Antibiotic Note  Cheryl Mueller is a 75 y.o. female admitted on 10/13/2015 with abdominal wall cellulitus.  Pharmacy has been consulted for vancomcyin and Zosyn dosing.  Plan: Current orders for vancomycin 750mg  IV Q24H and zosyn 3.375gm IV Q8H EI  Vancomycin trough < 254mcg/ml. Will change dose to vancomycin 500mg  IV Q12H to start 6 hours after last 750mg  dose. Will check trough prior to 5th dose of this regimen, which should be at steady state.  Height: 4\' 11"  (149.9 cm) Weight: 137 lb (62.143 kg) IBW/kg (Calculated) : 43.2  Temp (24hrs), Avg:98 F (36.7 C), Min:97.5 F (36.4 C), Max:98.6 F (37 C)   Recent Labs Lab 10/13/15 1818 10/14/15 0547 10/15/15 0501 10/16/15 0534 10/17/15 0731  WBC 21.2* 17.8* 14.4* 7.5  --   CREATININE 0.53 0.55 0.46 0.34*  --   VANCOTROUGH  --   --   --   --  <4*    Estimated Creatinine Clearance: 47.2 mL/min (by C-G formula based on Cr of 0.34).    Allergies  Allergen Reactions  . Alendronate Other (See Comments)    Reaction:  Leg pain   . Atorvastatin Rash  . Formaldehyde Rash    Antimicrobials this admission: 10/13/15 Zosyn >>  10/13/15 Vancomycin >>   Dose adjustments 4/25 Trough < 4, Vancomycin 750 Q24 -> 500 Q12   Microbiology results: 4/24 Wound: heavy growth staph aureus 4/24 MRSA PCR positive   Thank you for allowing pharmacy to be a part of this patient's care.  Garlon HatchetJody Garlan Drewes, PharmD Clinical Pharmacist   10/17/2015 10:14 AM

## 2015-10-17 NOTE — Progress Notes (Signed)
[facility]   PATIENT NAME: Cheryl Mueller    MR#:  [id number]  DATE OF BIRTH:  1940-10-29  SUBJECTIVE:  CHIEF COMPLAINT:  No chief complaint on file.  - Patient with abdominal wall cellulitis. S/p I&D yesterday - has drains placed in, pain much improved after I&D - surgery recommended 1 more day of IV ABX  REVIEW OF SYSTEMS:  Review of Systems  Constitutional: Positive for malaise/fatigue. Negative for fever and chills.  Respiratory: Negative for cough, shortness of breath and wheezing.   Cardiovascular: Negative for chest pain and palpitations.  Gastrointestinal: Positive for abdominal pain. Negative for nausea, vomiting, diarrhea and constipation.  Genitourinary: Negative for dysuria.  Neurological: Negative for dizziness, sensory change, speech change, focal weakness, seizures and headaches.    DRUG ALLERGIES:   Allergies  Allergen Reactions  . Alendronate Other (See Comments)    Reaction:  Leg pain   . Atorvastatin Rash  . Formaldehyde Rash    VITALS:  Blood pressure 169/70, pulse 79, temperature 98.2 F (36.8 C), temperature source Oral, resp. rate 16, height 4\' 11"  (1.499 m), weight 62.143 kg (137 lb), SpO2 95 %.  PHYSICAL EXAMINATION:  Physical Exam  GENERAL: 75 y.o.-year-old patient lying in the bed with no acute distress.  EYES: Pupils equal, round, reactive to light and accommodation. No scleral icterus. Extraocular muscles intact.  HEENT: Head atraumatic, normocephalic. Oropharynx and nasopharynx clear.  NECK: Supple, no jugular venous distention. No thyroid enlargement, no tenderness.  LUNGS: Normal breath sounds bilaterally, no wheezing, rales,rhonchi or crepitation. No use of accessory muscles of respiration. Decreased bibasilar breath sounds. CARDIOVASCULAR: S1, S2 normal. No murmurs, rubs, or gallops.  ABDOMEN: Soft, nondistended. Right flank dressing placed after I&D and drains in. Bowel sounds  present. No organomegaly or mass.  EXTREMITIES: No pedal edema, cyanosis, or clubbing.  NEUROLOGIC: Cranial nerves II through XII are intact. Muscle strength 5/5 in all extremities. Sensation intact. Gait not checked.  PSYCHIATRIC: The patient is alert and oriented x 3.  SKIN: No obvious rash, lesion, or ulcer.   LABORATORY PANEL:   CBC  Recent Labs Lab 10/16/15 0534  WBC 7.5  HGB 11.1*  HCT 32.4*  PLT 264   ------------------------------------------------------------------------------------------------------------------  Chemistries   Recent Labs Lab 10/16/15 0534  NA 137  K 3.4*  CL 105  CO2 24  GLUCOSE 139*  BUN <5*  CREATININE 0.34*  CALCIUM 8.4*   ------------------------------------------------------------------------------------------------------------------  Cardiac Enzymes No results for input(s): TROPONINI in the last 168 hours. ------------------------------------------------------------------------------------------------------------------  RADIOLOGY:  No results found.  EKG:   Orders placed or performed during the hospital encounter of 03/21/15  . EKG 12-Lead  . EKG 12-Lead    ASSESSMENT AND PLAN:   Cheryl Mueller is a 75 y.o. female with a known history of hypertension, non-insulin-dependent diabetes mellitus, hyperlipidemia presents to the hospital secondary to right lower quadrant abdominal wall cellulitis.  #1 Severe abdominal wall cellulitis- no abscess noted on CT abd, improved wbc with ABX - Has surgical I&D done to the area yesterday with significant amount of pus- appreciate surgical consult - drains in place - wound cultures growing staph aureus - discontinue zosyn, continue vancomycin - 1 more day of Iv ABX -cont prn pain meds  #2 hypokalemia-potassium supplements- monitor  #3 hypertension-continue Norvasc, lisinopril. Monitor potassium levels. Hold hydrochlorothiazide.  #4 uncontrolled diabetes mellitus-held metformin due  to contrast enhanced CT done on adm on admission. Restart now - A1c of 7.9. Continue glyburide. Sugars  are <200 -Sliding scale insulin.  -Much improved sugars here.  #5 DVT prophylaxis-on Lovenox   DISCHARGE TOMORROW ON ORAL ANTIBIOTICS   All the records are reviewed and case discussed with Care Management/Social Workerr. Management plans discussed with the patient, family and they are in agreement.  CODE STATUS: Full code  TOTAL TIME TAKING CARE OF THIS PATIENT: 34 minutes.   POSSIBLE D/C TOMORROW, DEPENDING ON CLINICAL CONDITION.   Enid Baas M.D on 10/17/2015 at 11:15 AM  Between 7am to 6pm - Pager - [phone]  After 6pm go to [email or web address] - password [email or web address] [facility]  [facility]  Office  [phone]  CC: Primary care physician; Lynnea Ferrier, MD

## 2015-10-18 LAB — CBC
HEMATOCRIT: 35.8 % (ref 35.0–47.0)
Hemoglobin: 12.3 g/dL (ref 12.0–16.0)
MCH: 29.9 pg (ref 26.0–34.0)
MCHC: 34.3 g/dL (ref 32.0–36.0)
MCV: 87.4 fL (ref 80.0–100.0)
Platelets: 373 10*3/uL (ref 150–440)
RBC: 4.1 MIL/uL (ref 3.80–5.20)
RDW: 13.1 % (ref 11.5–14.5)
WBC: 7.3 10*3/uL (ref 3.6–11.0)

## 2015-10-18 LAB — BASIC METABOLIC PANEL
Anion gap: 10 (ref 5–15)
BUN: 10 mg/dL (ref 6–20)
CO2: 23 mmol/L (ref 22–32)
Calcium: 8.9 mg/dL (ref 8.9–10.3)
Chloride: 101 mmol/L (ref 101–111)
Creatinine, Ser: 0.36 mg/dL — ABNORMAL LOW (ref 0.44–1.00)
GFR calc Af Amer: >60 mL/min (ref >60)
GFR calc non Af Amer: >60 mL/min (ref >60)
Glucose, Bld: 121 mg/dL — ABNORMAL HIGH (ref 65–99)
Potassium: 3.1 mmol/L — ABNORMAL LOW (ref 3.5–5.1)
Sodium: 134 mmol/L — ABNORMAL LOW (ref 135–145)

## 2015-10-18 LAB — GLUCOSE, CAPILLARY: Glucose-Capillary: 120 mg/dL — ABNORMAL HIGH (ref 65–99)

## 2015-10-18 MED ORDER — TRAZODONE HCL 50 MG PO TABS: 25.0000 mg | ORAL_TABLET | Freq: Every day | ORAL | Status: AC

## 2015-10-18 MED ORDER — HYDROCODONE-ACETAMINOPHEN 7.5-325 MG PO TABS: 1.0000 | ORAL_TABLET | Freq: Four times a day (QID) | ORAL | Status: DC | PRN

## 2015-10-18 MED ORDER — SULFAMETHOXAZOLE-TRIMETHOPRIM 800-160 MG PO TABS: 1.0000 | ORAL_TABLET | Freq: Two times a day (BID) | ORAL | Status: DC

## 2015-10-18 NOTE — Discharge Summary (Signed)
[facility] [place] at [facility]   PATIENT NAME: Cheryl Mueller    MR#:  [id number]  DATE OF BIRTH:  11/15/1940  DATE OF ADMISSION:  10/13/2015 ADMITTING PHYSICIAN: Enid Baas, MD  DATE OF DISCHARGE: 10/18/2015 11:34 AM  PRIMARY CARE PHYSICIAN: Curtis Sites III, MD    ADMISSION DIAGNOSIS:  sepsis N/A  DISCHARGE DIAGNOSIS:  Active Problems:   Abdominal wall cellulitis   SECONDARY DIAGNOSIS:   Past Medical History  Diagnosis Date  . Hypertension   . Diabetes mellitus without complication (HCC)     Non Insulin dependant  . TIA (transient ischemic attack)     approx 15 years ago  . Vertigo     hx of  . Hyperlipemia     HOSPITAL COURSE:   Cheryl Mueller is a 75 y.o. female with a known history of hypertension, non-insulin-dependent diabetes mellitus, hyperlipidemia presents to the hospital secondary to right lower quadrant abdominal wall cellulitis.  #1 Severe abdominal wall cellulitis- no abscess noted on CT abd, improved wbc with ABX - Has surgical I&D done to the area 10/16/15 with significant amount of pus- appreciate surgical consult - drain in place-will be removed as an outpatient in one week - wound cultures growing MRSA --Received vancomycin in the hospital. Change to Bactrim and discharge -cont prn pain meds as needed -Surgical consult is appreciated. Patient will follow up as outpatient  #2 hypokalemia -replaced on admission.  #3 hypertension-continue Norvasc, lisinopril and hydrochlorothiazide.  #4 uncontrolled diabetes mellitus-- A1c of 7.9. Continue glyburide and metformin  Patient will be discharged home today.   DISCHARGE CONDITIONS:   Stable  CONSULTS OBTAINED:  Treatment Team:  Ricarda Frame, MD  DRUG ALLERGIES:   Allergies  Allergen Reactions  . Alendronate Other (See Comments)    Reaction:  Leg pain   . Atorvastatin Rash  . Formaldehyde Rash    DISCHARGE MEDICATIONS:   Discharge Medication List  as of 10/18/2015 11:09 AM    START taking these medications   Details  sulfamethoxazole-trimethoprim (BACTRIM DS,SEPTRA DS) 800-160 MG tablet Take 1 tablet by mouth 2 (two) times daily., Starting 10/18/2015, Until Discontinued, Print      CONTINUE these medications which have CHANGED   Details  HYDROcodone-acetaminophen (NORCO) 7.5-325 MG tablet Take 1 tablet by mouth every 6 (six) hours as needed for moderate pain or severe pain., Starting 10/18/2015, Until Discontinued, Print    traZODone (DESYREL) 50 MG tablet Take 0.5 tablets (25 mg total) by mouth at bedtime., Starting 10/18/2015, Until Discontinued, Print      CONTINUE these medications which have NOT CHANGED   Details  amLODipine (NORVASC) 10 MG tablet Take 10 mg by mouth daily. , Until Discontinued, Historical Med    aspirin EC 81 MG tablet Take 81 mg by mouth daily., Until Discontinued, Historical Med    atorvastatin (LIPITOR) 80 MG tablet Take 80 mg by mouth at bedtime., Until Discontinued, Historical Med    Calcium Carbonate-Vitamin D (CALCIUM 600+D) 600-400 MG-UNIT tablet Take 1 tablet by mouth 2 (two) times daily., Until Discontinued, Historical Med    glimepiride (AMARYL) 4 MG tablet Take 1 tablet (4 mg total) by mouth daily with breakfast., Starting 04/21/2015, Until Discontinued, No Print    hydrochlorothiazide (MICROZIDE) 12.5 MG capsule Take 12.5 mg by mouth daily. , Until Discontinued, Historical Med    Ibuprofen-Diphenhydramine Cit (IBUPROFEN PM) 200-38 MG TABS Take 2 tablets by mouth at bedtime as needed (for sleep)., Until Discontinued, Historical Med  lisinopril (PRINIVIL,ZESTRIL) 40 MG tablet Take 40 mg by mouth 2 (two) times daily. , Until Discontinued, Historical Med    metFORMIN (GLUCOPHAGE) 1000 MG tablet Take 1,000 mg by mouth 2 (two) times daily with a meal., Until Discontinued, Historical Med    mometasone (ELOCON) 0.1 % cream Apply 1 application topically daily as needed (for itching)., Until  Discontinued, Historical Med    rOPINIRole (REQUIP) 0.25 MG tablet Take 1 tablet (0.25 mg total) by mouth at bedtime., Starting 04/21/2015, Until Discontinued, No Print      STOP taking these medications     docusate sodium (COLACE) 100 MG capsule      Ferrous Sulfate Dried 140 (45 FE) MG TBCR      folic acid (FOLVITE) 1 MG tablet      meloxicam (MOBIC) 7.5 MG tablet          DISCHARGE INSTRUCTIONS:   1. PCP f/u in 1-2 weeks 2. Surgical consult in 1 week to get the drain out.  If you experience worsening of your admission symptoms, develop shortness of breath, life threatening emergency, suicidal or homicidal thoughts you must seek medical attention immediately by calling 911 or calling your MD immediately  if symptoms less severe.  You Must read complete instructions/literature along with all the possible adverse reactions/side effects for all the Medicines you take and that have been prescribed to you. Take any new Medicines after you have completely understood and accept all the possible adverse reactions/side effects.   Please note  You were cared for by a hospitalist during your hospital stay. If you have any questions about your discharge medications or the care you received while you were in the hospital after you are discharged, you can call the unit and asked to speak with the hospitalist on call if the hospitalist that took care of you is not available. Once you are discharged, your primary care physician will handle any further medical issues. Please note that NO REFILLS for any discharge medications will be authorized once you are discharged, as it is imperative that you return to your primary care physician (or establish a relationship with a primary care physician if you do not have one) for your aftercare needs so that they can reassess your need for medications and monitor your lab values.    Today   CHIEF COMPLAINT:  No chief complaint on file.   VITAL SIGNS:   Blood pressure 167/65, pulse 67, temperature 98 F (36.7 C), temperature source Oral, resp. rate 18, height  (1.499 m), weight 62.143 kg (137 lb), SpO2 95 %.  I/O:   Intake/Output Summary (Last 24 hours) at 10/18/15 1248 Last data filed at 10/18/15 0700  Gross per 24 hour  Intake    120 ml  Output    400 ml  Net   -280 ml    PHYSICAL EXAMINATION:   Physical Exam  GENERAL: 75 y.o.-year-old patient lying in the bed with no acute distress.  EYES: Pupils equal, round, reactive to light and accommodation. No scleral icterus. Extraocular muscles intact.  HEENT: Head atraumatic, normocephalic. Oropharynx and nasopharynx clear.  NECK: Supple, no jugular venous distention. No thyroid enlargement, no tenderness.  LUNGS: Normal breath sounds bilaterally, no wheezing, rales,rhonchi or crepitation. No use of accessory muscles of respiration. Decreased bibasilar breath sounds. CARDIOVASCULAR: S1, S2 normal. No murmurs, rubs, or gallops.  ABDOMEN: Soft, nondistended. Right flank dressing placed after I&D and penrose drain in. Bowel sounds present. No organomegaly or mass.  EXTREMITIES:  No pedal edema, cyanosis, or clubbing.  NEUROLOGIC: Cranial nerves II through XII are intact. Muscle strength 5/5 in all extremities. Sensation intact. Gait not checked.  PSYCHIATRIC: The patient is alert and oriented x 3.  SKIN: No obvious rash, lesion, or ulcer.   DATA REVIEW:   CBC  Recent Labs Lab 10/18/15 0510  WBC 7.3  HGB 12.3  HCT 35.8  PLT 373    Chemistries   Recent Labs Lab 10/18/15 0510  NA 134*  K 3.1*  CL 101  CO2 23  GLUCOSE 121*  BUN 10  CREATININE 0.36*  CALCIUM 8.9    Cardiac Enzymes No results for input(s): TROPONINI in the last 168 hours.  Microbiology Results  Results for orders placed or performed during the hospital encounter of 10/13/15  Surgical pcr screen     Status: Abnormal   Collection Time: 10/16/15 10:30 AM  Result Value Ref Range Status    MRSA, PCR POSITIVE (A) NEGATIVE Final    Comment: CRITICAL RESULT CALLED TO, READ BACK BY AND VERIFIED WITH: JACK DOUGHERTY AT 1219 ON 10/16/15.Marland Kitchen.Marland Kitchen.[facility]    Staphylococcus aureus POSITIVE (A) NEGATIVE Final    Comment:        The Xpert SA Assay (FDA approved for NASAL specimens in patients over 77 years of age), is one component of a comprehensive surveillance program.  Test performance has been validated by [facility] for patients greater than or equal to 77 year old. It is not intended to diagnose infection nor to guide or monitor treatment.   Wound culture     Status: None (Preliminary result)   Collection Time: 10/16/15  1:38 PM  Result Value Ref Range Status   Specimen Description ABSCESS  Final   Special Requests NONE  Final   Gram Stain   Final    MODERATE RED BLOOD CELLS MODERATE WBC SEEN MODERATE GRAM POSITIVE COCCI    Culture   Final    HEAVY GROWTH METHICILLIN RESISTANT STAPHYLOCOCCUS AUREUS   Report Status PENDING  Incomplete   Organism ID, Bacteria METHICILLIN RESISTANT STAPHYLOCOCCUS AUREUS  Final      Susceptibility   Methicillin resistant staphylococcus aureus - MIC*    CIPROFLOXACIN <=0.5 SENSITIVE Sensitive     ERYTHROMYCIN <=0.25 SENSITIVE Sensitive     GENTAMICIN <=0.5 SENSITIVE Sensitive     OXACILLIN >=4 RESISTANT Resistant     TETRACYCLINE <=1 SENSITIVE Sensitive     VANCOMYCIN 1 SENSITIVE Sensitive     TRIMETH/SULFA <=10 SENSITIVE Sensitive     CLINDAMYCIN <=0.25 SENSITIVE Sensitive     RIFAMPIN <=0.5 SENSITIVE Sensitive     Inducible Clindamycin NEGATIVE Sensitive     * HEAVY GROWTH METHICILLIN RESISTANT STAPHYLOCOCCUS AUREUS    RADIOLOGY:  No results found.  EKG:   Orders placed or performed during the hospital encounter of 03/21/15  . EKG 12-Lead  . EKG 12-Lead      Management plans discussed with the patient, family and they are in agreement.  CODE STATUS:     Code Status Orders        Start     Ordered   10/13/15 1708   Full code   Continuous     10/13/15 1709    Code Status History    Date Active Date Inactive Code Status Order ID Comments User Context   04/17/2015  4:30 PM 04/21/2015  2:23 PM Full Code [id number]  Lynnea FerrierBert J Klein III, MD Inpatient   03/23/2015  2:27 PM 03/23/2015  5:50 PM Full  Code [id number]  Kennedy Bucker, MD Inpatient   11/26/2012  4:41 PM 11/27/2012  1:02 PM Full Code [id number]  Hewitt Shorts, MD Inpatient    Advance Directive Documentation        Most Recent Value   Type of Advance Directive  Healthcare Power of Attorney, Living will   Pre-existing out of facility DNR order (yellow form or pink MOST form)     "MOST" Form in Place?        TOTAL TIME TAKING CARE OF THIS PATIENT: 37  minutes.    Enid Baas M.D on 10/18/2015 at 12:48 PM  Between 7am to 6pm - Pager - [phone]  After 6pm go to [email or web address] - password [email or web address] [facility]  [place] [facility]  Office  [phone]  CC: Primary care physician; Lynnea Ferrier, MD

## 2015-10-18 NOTE — Progress Notes (Signed)
2 Days Post-Op  Subjective: Status post I&D patient feels much better  Objective: Vital signs in last 24 hours: Temp:  [98 F (36.7 C)-98.3 F (36.8 C)] 98 F (36.7 C) (04/26 0358) Pulse Rate:  [67-82] 67 (04/26 0358) Resp:  [18-19] 18 (04/26 0358) BP: (159-167)/(60-69) 167/65 mmHg (04/26 0358) SpO2:  [95 %-96 %] 95 % (04/26 0358) Last BM Date: 10/16/15  Intake/Output from previous day: 04/25 0701 - 04/26 0700 In: 595 [P.O.:240; I.V.:355] Out: 550 [Urine:550] Intake/Output this shift:    Physical exam:  Wound clean without erythema no purulence N Rosen place  Lab Results: CBC   Recent Labs  10/16/15 0534 10/18/15 0510  WBC 7.5 7.3  HGB 11.1* 12.3  HCT 32.4* 35.8  PLT 264 373   BMET  Recent Labs  10/16/15 0534 10/18/15 0510  NA 137 134*  K 3.4* 3.1*  CL 105 101  CO2 24 23  GLUCOSE 139* 121*  BUN <5* 10  CREATININE 0.34* 0.36*  CALCIUM 8.4* 8.9   PT/INR No results for input(s): LABPROT, INR in the last 72 hours. ABG No results for input(s): PHART, HCO3 in the last 72 hours.  Invalid input(s): PCO2, PO2  Studies/Results: No results found.  Anti-infectives: Anti-infectives    Start     Dose/Rate Route Frequency Ordered Stop   10/18/15 0000  sulfamethoxazole-trimethoprim (BACTRIM DS,SEPTRA DS) 800-160 MG tablet    Comments:  X 8 more days   1 tablet Oral 2 times daily 10/18/15 0905     10/17/15 1530  vancomycin (VANCOCIN) 500 mg in sodium chloride 0.9 % 100 mL IVPB     500 mg 100 mL/hr over 60 Minutes Intravenous Every 12 hours 10/17/15 1014     10/14/15 0800  vancomycin (VANCOCIN) IVPB 750 mg/150 ml premix  Status:  Discontinued     750 mg 150 mL/hr over 60 Minutes Intravenous Every 24 hours 10/13/15 1854 10/17/15 1014   10/13/15 1900  piperacillin-tazobactam (ZOSYN) IVPB 3.375 g  Status:  Discontinued     3.375 g 12.5 mL/hr over 240 Minutes Intravenous Every 8 hours 10/13/15 1847 10/17/15 1114   10/13/15 1900  vancomycin (VANCOCIN) IVPB 1000  mg/200 mL premix     1,000 mg 200 mL/hr over 60 Minutes Intravenous  Once 10/13/15 1854 10/13/15 2132      Assessment/Plan: s/p Procedure(s): I & D OF ABDOMINAL WALL ABSCESS   We'll send home with Penrose drain I discussed this with prime doc and will see the patient next week for Penrose removal  Lattie Hawichard E Diahn Waidelich, MD, FACS  10/18/2015

## 2015-10-18 NOTE — Care Management Important Message (Signed)
Important Message  Patient Details  Name: Cheryl Mueller C Pestka MRN: [id number] Date of Birth: Feb 06, 1941   Medicare Important Message Given:  Yes    Chapman FitchBOWEN, Trevonne Nyland T, RN 10/18/2015, 9:52 AM

## 2015-10-18 NOTE — Progress Notes (Signed)
Patient A/O, no noted distress. C/O pain and insomnia, administered prn regimen; effective. Patient noted she slept well tonight. Dressing changed minimal drainage. Administered bath as ordered. Patient ambulated in room. Staff will continue to monitor and meet needs.

## 2015-10-19 LAB — WOUND CULTURE

## 2015-10-20 ENCOUNTER — Other Ambulatory Visit: Payer: Self-pay

## 2015-10-20 DIAGNOSIS — E785 Hyperlipidemia, unspecified: Secondary | ICD-10-CM | POA: Insufficient documentation

## 2015-10-20 DIAGNOSIS — I10 Essential (primary) hypertension: Secondary | ICD-10-CM | POA: Insufficient documentation

## 2015-10-20 DIAGNOSIS — M810 Age-related osteoporosis without current pathological fracture: Secondary | ICD-10-CM | POA: Insufficient documentation

## 2015-10-23 ENCOUNTER — Other Ambulatory Visit: Payer: Self-pay

## 2015-10-25 ENCOUNTER — Ambulatory Visit (INDEPENDENT_AMBULATORY_CARE_PROVIDER_SITE_OTHER): Payer: Medicare Other | Admitting: Surgery

## 2015-10-25 ENCOUNTER — Encounter: Payer: Self-pay | Admitting: Surgery

## 2015-10-25 VITALS — BP 144/69 | HR 84 | Temp 98.4°F | Ht 59.0 in | Wt 138.0 lb

## 2015-10-25 DIAGNOSIS — L03311 Cellulitis of abdominal wall: Secondary | ICD-10-CM

## 2015-10-25 NOTE — Progress Notes (Signed)
75 year old female status post I&D of her right abdomen wall abscess with Penrose drain placement 2. Patient states that she's been doing much better that her energy and her appetite began to improve as well. Patient states that she has completed the antibiotics and to drainages minimal. Denies any fever or chills.  Filed Vitals:   10/25/15 0944  BP: 144/69  Pulse: 84  Temp: 98.4 F (36.9 C)   PE;  Gen: NAD Abd: soft, non-tender, RLQ wound with two penrose drains, one removed, no erythema, still some minimal drainage seropurulent from wound  A/P: Patient seems to be improving. Patient with MRSA abscess treated appropriately leave with antibiotics. We'll have her return in one week for removal of second Penrose.

## 2015-10-25 NOTE — Patient Instructions (Signed)
Please continue to change dressing daily. We will see you in our office next week to romove drain.

## 2015-10-30 ENCOUNTER — Ambulatory Visit (INDEPENDENT_AMBULATORY_CARE_PROVIDER_SITE_OTHER): Payer: Medicare Other | Admitting: General Surgery

## 2015-10-30 ENCOUNTER — Encounter: Payer: Self-pay | Admitting: General Surgery

## 2015-10-30 ENCOUNTER — Other Ambulatory Visit: Payer: Self-pay

## 2015-10-30 ENCOUNTER — Encounter (INDEPENDENT_AMBULATORY_CARE_PROVIDER_SITE_OTHER): Payer: Self-pay

## 2015-10-30 VITALS — BP 162/71 | HR 90 | Temp 97.8°F | Ht 59.0 in | Wt 137.0 lb

## 2015-10-30 DIAGNOSIS — Z4889 Encounter for other specified surgical aftercare: Secondary | ICD-10-CM

## 2015-10-30 NOTE — Patient Instructions (Signed)
Please call our office if you have questions or concerns.   

## 2015-10-30 NOTE — Progress Notes (Signed)
Outpatient Surgical Follow Up  10/30/2015  Cheryl Mueller is an 75 y.o. female.   Chief Complaint  Patient presents with  . Routine Post Op    I&D abscess abdominal wall    HPI: 75 year old female returns to clinic for follow-up I&D of abdominal wall abscess. Patient reports no drainage from Penrose drain over last 24 hours. She denies any fevers, chills, nausea, vomiting, diarrhea, cough patient. All of the pain and discomfort she had an area is completely resolved.  Past Medical History  Diagnosis Date  . Hypertension   . Diabetes mellitus without complication (HCC)     Non Insulin dependant  . TIA (transient ischemic attack)     approx 15 years ago  . Vertigo     hx of  . Hyperlipemia     Past Surgical History  Procedure Laterality Date  . Colonoscopy w/ polypectomy    . Lumbar laminectomy/decompression microdiscectomy Right 11/26/2012    Procedure: LUMBAR LAMINECTOMY/DECOMPRESSION MICRODISCECTOMY 1 LEVEL;  Surgeon: Hewitt Shortsobert W Nudelman, MD;  Location: [facility];  Service: Neurosurgery;  Laterality: Right;  Lumbar five-sacral one laminotomy and microdiskectomy   . Orif patella Left 03/23/2015    Procedure: OPEN REDUCTION INTERNAL (ORIF) FIXATION PATELLA;  Surgeon: Kennedy BuckerMichael Menz, MD;  Location: [facility] ORS;  Service: Orthopedics;  Laterality: Left;  Marland Kitchen. Kyphoplasty N/A 04/19/2015    Procedure: KYPHOPLASTY L 1;  Surgeon: Kennedy BuckerMichael Menz, MD;  Location: [facility] ORS;  Service: Orthopedics;  Laterality: N/A;  . Debridement of abdominal wall abscess N/A 10/16/2015    Procedure: I & D OF ABDOMINAL WALL ABSCESS;  Surgeon: Lattie Hawichard E Cooper, MD;  Location: [facility] ORS;  Service: General;  Laterality: N/A;    Family History  Problem Relation Age of Onset  . Stroke Mother   . Breast cancer Neg Hx   . Hypertension Mother   . Diabetes Father     Social History:  reports that she has never smoked. She has never used smokeless tobacco. She reports that she does not drink alcohol or use illicit  drugs.  Allergies:  Allergies  Allergen Reactions  . Alendronate Other (See Comments)    Reaction:  Leg pain   . Atorvastatin Rash  . Formaldehyde Rash    Medications reviewed.    ROS  A multipoint review of systems was completed, all pertinent positives and negatives are documented in the history of present illness and the remainder are negative.  BP 162/71 mmHg  Pulse 90  Temp(Src) 97.8 F (36.6 C) (Oral)  Ht 4\' 11"  (1.499 m)  Wt 62.143 kg (137 lb)  BMI 27.66 kg/m2  Physical Exam Gen.: No acute distress Chest: Clear to auscultation Heart: Regular rate and rhythm Abdomen: Soft, nontender, nondistended. Penrose drain in place without any evidence of drainage, erythema, purulence.    No results found for this or any previous visit (from the past 48 hour(s)). No results found.  Assessment/Plan:  1. Aftercare following surgery 75 year old female status post I&D of abdominal wall abscess. Doing well. Drain removed today clinic without difficulty. Discussed anticipated recovery with the patient. She voiced understanding and will follow-up in clinic on an as-needed basis.     Ricarda Frameharles Akeema Broder, MD FACS General Surgeon  10/30/2015,10:56 AM

## 2016-02-19 ENCOUNTER — Emergency Department
Admission: EM | Admit: 2016-02-19 | Discharge: 2016-02-19 | Disposition: A | Discharge status: Home / Self Care | Payer: Medicare Other | Attending: Emergency Medicine | Admitting: Emergency Medicine

## 2016-02-19 ENCOUNTER — Emergency Department: Payer: Medicare Other

## 2016-02-19 ENCOUNTER — Encounter: Payer: Self-pay | Admitting: Emergency Medicine

## 2016-02-19 DIAGNOSIS — E119 Type 2 diabetes mellitus without complications: Secondary | ICD-10-CM | POA: Diagnosis not present

## 2016-02-19 DIAGNOSIS — G8929 Other chronic pain: Secondary | ICD-10-CM | POA: Insufficient documentation

## 2016-02-19 DIAGNOSIS — M545 Low back pain, unspecified: Secondary | ICD-10-CM

## 2016-02-19 DIAGNOSIS — M25562 Pain in left knee: Secondary | ICD-10-CM | POA: Insufficient documentation

## 2016-02-19 DIAGNOSIS — Z79899 Other long term (current) drug therapy: Secondary | ICD-10-CM | POA: Insufficient documentation

## 2016-02-19 DIAGNOSIS — W1839XA Other fall on same level, initial encounter: Secondary | ICD-10-CM | POA: Insufficient documentation

## 2016-02-19 DIAGNOSIS — Y999 Unspecified external cause status: Secondary | ICD-10-CM | POA: Insufficient documentation

## 2016-02-19 DIAGNOSIS — I10 Essential (primary) hypertension: Secondary | ICD-10-CM | POA: Insufficient documentation

## 2016-02-19 DIAGNOSIS — Y939 Activity, unspecified: Secondary | ICD-10-CM | POA: Insufficient documentation

## 2016-02-19 DIAGNOSIS — Z7982 Long term (current) use of aspirin: Secondary | ICD-10-CM | POA: Insufficient documentation

## 2016-02-19 DIAGNOSIS — Z8781 Personal history of (healed) traumatic fracture: Secondary | ICD-10-CM | POA: Diagnosis not present

## 2016-02-19 DIAGNOSIS — Y92512 Supermarket, store or market as the place of occurrence of the external cause: Secondary | ICD-10-CM | POA: Insufficient documentation

## 2016-02-19 DIAGNOSIS — Z7984 Long term (current) use of oral hypoglycemic drugs: Secondary | ICD-10-CM | POA: Insufficient documentation

## 2016-02-19 DIAGNOSIS — Z791 Long term (current) use of non-steroidal anti-inflammatories (NSAID): Secondary | ICD-10-CM | POA: Diagnosis not present

## 2016-02-19 MED ORDER — OXYCODONE-ACETAMINOPHEN 5-325 MG PO TABS
ORAL_TABLET | ORAL | Status: AC
  Filled 2016-02-19: qty 1

## 2016-02-19 MED ORDER — LIDOCAINE 5 % EX PTCH
MEDICATED_PATCH | CUTANEOUS | Status: AC
  Administered 2016-02-19: 1 via TRANSDERMAL
  Filled 2016-02-19: qty 1

## 2016-02-19 MED ORDER — LIDOCAINE 5 % EX PTCH: 1.0000 | MEDICATED_PATCH | Freq: Two times a day (BID) | CUTANEOUS | 0 refills | Status: DC

## 2016-02-19 MED ORDER — OXYCODONE-ACETAMINOPHEN 5-325 MG PO TABS
1.0000 | ORAL_TABLET | ORAL | Status: DC | PRN
  Administered 2016-02-19: 1 via ORAL

## 2016-02-19 MED ORDER — LIDOCAINE 5 % EX PTCH
1.0000 | MEDICATED_PATCH | CUTANEOUS | Status: DC
  Administered 2016-02-19: 1 via TRANSDERMAL

## 2016-02-19 NOTE — ED Provider Notes (Signed)
[facility] Emergency Department Provider Note  ____________________________________________  Time seen: Approximately 7:16 AM  I have reviewed the triage vital signs and the nursing notes.   HISTORY  Chief Complaint Back Pain  Level 5 caveat:  Portions of the history and physical were unable to be obtained due to the patient's a poor historian   HPI Cheryl Mueller is a 75 y.o. female who complains of acute on chronic low back pain after a fall a week ago. She was in the grocery store when she fell backward onto her buttocks. This worsened her chronic lower back pain. She denies any other new injuries.  She also reports having A PATELLA FRACTURE IN THE PAST THAT WAS REPAIRED BY Dr. Rosita KeaMenz. She has followed up with Dr. Rosita KeaMenz 3 weeks ago who advised her that everything was still stable with the patella and there were no new issues. Some ongoing chronic pain in the left knee. No new injury to that area. Still ambulatory. She feels more hesitant than usual with her walking so she has started using a walker instead of the cane that she normally uses.   Past Medical History:  Diagnosis Date  . Diabetes mellitus without complication (HCC)    Non Insulin dependant  . Hyperlipemia   . Hypertension   . TIA (transient ischemic attack)    approx 15 years ago  . Vertigo    hx of     Patient Active Problem List   Diagnosis Date Noted  . 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 BP (high blood pressure) 10/20/2015  . HLD (hyperlipidemia) 10/20/2015  . Abdominal wall cellulitis 10/13/2015  . Anemia, iron deficiency 05/08/2015  . Deficiency in the vitamin folic acid 05/08/2015  . Compression fracture of lumbar vertebra (HCC) 05/08/2015  . Acute kidney injury (HCC) 04/20/2015  . Compression fracture of L1 lumbar vertebra (HCC) 04/18/2015  . Acute blood loss anemia 04/18/2015  . Hypokalemia 04/18/2015  . Dehydration 04/18/2015  . Hematoma 04/17/2015  . Cellulitis  04/17/2015  . Benign hypertension 04/17/2015  . Diabetes mellitus (HCC) 04/17/2015  . Cystocele, midline 08/11/2014  . Abnormal presence of protein in urine 06/20/2014  . Type 2 diabetes mellitus with other diabetic kidney complication (HCC) 12/14/2013     Past Surgical History:  Procedure Laterality Date  . COLONOSCOPY W/ POLYPECTOMY    . DEBRIDEMENT OF ABDOMINAL WALL ABSCESS N/A 10/16/2015   Procedure: I & D OF ABDOMINAL WALL ABSCESS;  Surgeon: Lattie Hawichard E Cooper, MD;  Location: [facility] ORS;  Service: General;  Laterality: N/A;  . KYPHOPLASTY N/A 04/19/2015   Procedure: KYPHOPLASTY L 1;  Surgeon: Kennedy BuckerMichael Menz, MD;  Location: [facility] ORS;  Service: Orthopedics;  Laterality: N/A;  . LUMBAR LAMINECTOMY/DECOMPRESSION MICRODISCECTOMY Right 11/26/2012   Procedure: LUMBAR LAMINECTOMY/DECOMPRESSION MICRODISCECTOMY 1 LEVEL;  Surgeon: Hewitt Shortsobert W Nudelman, MD;  Location: [facility];  Service: Neurosurgery;  Laterality: Right;  Lumbar five-sacral one laminotomy and microdiskectomy   . ORIF PATELLA Left 03/23/2015   Procedure: OPEN REDUCTION INTERNAL (ORIF) FIXATION PATELLA;  Surgeon: Kennedy BuckerMichael Menz, MD;  Location: [facility] ORS;  Service: Orthopedics;  Laterality: Left;     Prior to Admission medications   Medication Sig Start Date End Date Taking? Authorizing Provider  amLODipine (NORVASC) 10 MG tablet Take 10 mg by mouth daily.     Historical Provider, MD  aspirin EC 81 MG tablet Take 81 mg by mouth daily.    Historical Provider, MD  atorvastatin (LIPITOR) 80 MG tablet Take 80 mg by mouth  at bedtime.    Historical Provider, MD  Calcium Carbonate-Vitamin D (CALCIUM 600+D) 600-400 MG-UNIT tablet Take 1 tablet by mouth 2 (two) times daily.    Historical Provider, MD  glimepiride (AMARYL) 4 MG tablet Take 1 tablet (4 mg total) by mouth daily with breakfast. 04/21/15   Curtis Sites III, MD  hydrochlorothiazide (MICROZIDE) 12.5 MG capsule Take 12.5 mg by mouth daily.     Historical Provider, MD   HYDROcodone-acetaminophen (NORCO) 7.5-325 MG tablet Take 1 tablet by mouth every 6 (six) hours as needed for moderate pain or severe pain. 10/18/15   Enid Baas, MD  Ibuprofen-Diphenhydramine Cit (IBUPROFEN PM) 200-38 MG TABS Take 2 tablets by mouth at bedtime as needed (for sleep).    Historical Provider, MD  lidocaine (LIDODERM) 5 % Place 1 patch onto the skin every 12 (twelve) hours. Remove & Discard patch within 12 hours or as directed by MD 02/19/16   Sharman Cheek, MD  lisinopril (PRINIVIL,ZESTRIL) 40 MG tablet Take 40 mg by mouth 2 (two) times daily.     Historical Provider, MD  metFORMIN (GLUCOPHAGE) 1000 MG tablet Take 1,000 mg by mouth 2 (two) times daily with a meal.    Historical Provider, MD  mometasone (ELOCON) 0.1 % cream Apply 1 application topically daily as needed (for itching).    Historical Provider, MD  potassium chloride (K-DUR,KLOR-CON) 10 MEQ tablet Take 1 tablet by mouth daily. 05/10/15   Historical Provider, MD  rOPINIRole (REQUIP) 0.25 MG tablet Take 1 tablet (0.25 mg total) by mouth at bedtime. 04/21/15   Curtis Sites III, MD  sitaGLIPtin (JANUVIA) 100 MG tablet Take 1 tablet by mouth daily. 09/05/15 09/04/16  Historical Provider, MD  sulfamethoxazole-trimethoprim (BACTRIM DS,SEPTRA DS) 800-160 MG tablet Take 1 tablet by mouth 2 (two) times daily. 10/18/15   Historical Provider, MD  traZODone (DESYREL) 50 MG tablet Take 0.5 tablets (25 mg total) by mouth at bedtime. 10/18/15   Enid Baas, MD     Allergies Alendronate; Atorvastatin; and Formaldehyde   Family History  Problem Relation Age of Onset  . Stroke Mother   . Hypertension Mother   . Diabetes Father   . Breast cancer Neg Hx     Social History Social History  Substance Use Topics  . Smoking status: Never Smoker  . Smokeless tobacco: Never Used  . Alcohol use No    Review of Systems  Constitutional:   No fever or chills.  ENT:   No sore throat. No rhinorrhea. Cardiovascular:   No  chest pain. Respiratory:   No dyspnea or cough. Gastrointestinal:   Negative for abdominal pain, vomiting and diarrhea.  Genitourinary:   Negative for dysuria or difficulty urinating. Musculoskeletal:   acute on chronic low back pain. Chronic left knee painng Neurological:   Negative for headaches 10-point ROS otherwise negative.  ____________________________________________   PHYSICAL EXAM:  VITAL SIGNS: ED Triage Vitals  Enc Vitals Group     BP 02/19/16 0209 (!) 147/90     Pulse Rate 02/19/16 0209 90     Resp 02/19/16 0209 16     Temp 02/19/16 0209 97.9 F (36.6 C)     Temp Source 02/19/16 0209 Oral     SpO2 02/19/16 0209 96 %     Weight 02/19/16 0209 142 lb (64.4 kg)     Height 02/19/16 0209 4\' 11"  (1.499 m)     Head Circumference --      Peak Flow --      Pain  Score 02/19/16 0210 8     Pain Loc --      Pain Edu? --      Excl. in GC? --     Vital signs reviewed, nursing assessments reviewed.   Constitutional:   Alert and oriented. Well appearing and in no distress. Eyes:   No scleral icterus. No conjunctival pallor. PERRL. EOMI.  No nystagmus. ENT   Head:   Normocephalic and atraumatic.   Nose:   No congestion/rhinnorhea. No septal hematoma   Mouth/Throat:   MMM, no pharyngeal erythema. No peritonsillar mass.    Neck:   No stridor. No SubQ emphysema. No meningismus. Hematological/Lymphatic/Immunilogical:   No cervical lymphadenopathy. Cardiovascular:   RRR. Symmetric bilateral radial and DP pulses.  No murmurs.  Respiratory:   Normal respiratory effort without tachypnea nor retractions. Breath sounds are clear and equal bilaterally. No wheezes/rales/rhonchi. Gastrointestinal:   Soft and nontender. Non distended. There is no CVA tenderness.  No rebound, rigidity, or guarding. Genitourinary:   deferred Musculoskeletal:   Nontender with normal range of motion in all extremities. No joint effusions.  No lower extremity tenderness.  No edema.o left knee  tenderness. Ligaments stable. Patella nontender and stable. No midline spinal tenderness. Low back pain is reproduced by having the patient rotate the trunk to the right. Neurologic:   Normal speech and language.  CN 2-10 normal. Motor grossly intact. Ambulatory with steady gait with and without a walker. No gross focal neurologic deficits are appreciated.  Skin:    Skin is warm, dry and intact. No rash noted.  No petechiae, purpura, or bullae.  ____________________________________________    LABS (pertinent positives/negatives) (all labs ordered are listed, but only abnormal results are displayed) Labs Reviewed - No data to display ____________________________________________   EKG    ____________________________________________    RADIOLOGY  X-ray lumbar spine unremarkable  ____________________________________________   PROCEDURES Procedures  ____________________________________________   INITIAL IMPRESSION / ASSESSMENT AND PLAN / ED COURSE  Pertinent labs & imaging results that were available during my care of the patient were reviewed by me and considered in my medical decision making (see chart for details).  Patient well appearing no acute distress. Presents with acute on chronic lumbar pain after recent fall.no red flag symptoms. Patient given Lidoderm patch and feels that her pain is much better and is ambulatory. We'll prescribe Lidoderm and have her follow-up with Dr. Rosita Kea today.     Clinical Course   ____________________________________________   FINAL CLINICAL IMPRESSION(S) / ED DIAGNOSES  Final diagnoses:  Bilateral low back pain without sciatica       Portions of this note were generated with dragon dictation software. Dictation errors may occur despite best attempts at proofreading.    Sharman Cheek, MD 02/19/16 [phone]

## 2016-02-19 NOTE — ED Triage Notes (Signed)
Pt states she fell a week ago injuring low back. Pt states pain has increased over last 2 days. Pt states "just get me out of this pain, now". Cms intact to extremities, no loss of bowel or bladder. Skin normal color warm and dry.

## 2016-02-19 NOTE — ED Notes (Signed)
E-signature box not working. Pt verbalized understanding of discharge instructions and denied questions. 

## 2016-02-19 NOTE — ED Notes (Signed)
Pt updated on wait time. Pt ambulatory around waiting area, states improved pain after percocet.

## 2016-02-22 ENCOUNTER — Other Ambulatory Visit: Payer: Self-pay | Admitting: Orthopedic Surgery

## 2016-02-22 DIAGNOSIS — S32020A Wedge compression fracture of second lumbar vertebra, initial encounter for closed fracture: Secondary | ICD-10-CM

## 2016-02-22 DIAGNOSIS — M5440 Lumbago with sciatica, unspecified side: Secondary | ICD-10-CM

## 2016-02-27 ENCOUNTER — Ambulatory Visit: Payer: Medicare Other

## 2016-02-27 ENCOUNTER — Ambulatory Visit: Payer: Medicare Other | Admitting: Anesthesiology

## 2016-02-27 ENCOUNTER — Encounter: Payer: Self-pay | Admitting: Orthopedic Surgery

## 2016-02-27 ENCOUNTER — Encounter
Admission: RE | Disposition: A | Discharge status: Home / Self Care | Payer: Self-pay | Source: Ambulatory Visit | Attending: Orthopedic Surgery

## 2016-02-27 ENCOUNTER — Ambulatory Visit
Admission: RE | Admit: 2016-02-27 | Discharge: 2016-02-27 | Disposition: A | Discharge status: Home / Self Care | Payer: Medicare Other | Source: Ambulatory Visit | Attending: Orthopedic Surgery | Admitting: Orthopedic Surgery

## 2016-02-27 DIAGNOSIS — L409 Psoriasis, unspecified: Secondary | ICD-10-CM | POA: Insufficient documentation

## 2016-02-27 DIAGNOSIS — S32029A Unspecified fracture of second lumbar vertebra, initial encounter for closed fracture: Secondary | ICD-10-CM | POA: Insufficient documentation

## 2016-02-27 DIAGNOSIS — E785 Hyperlipidemia, unspecified: Secondary | ICD-10-CM | POA: Diagnosis not present

## 2016-02-27 DIAGNOSIS — Z888 Allergy status to other drugs, medicaments and biological substances status: Secondary | ICD-10-CM | POA: Insufficient documentation

## 2016-02-27 DIAGNOSIS — Y9389 Activity, other specified: Secondary | ICD-10-CM | POA: Insufficient documentation

## 2016-02-27 DIAGNOSIS — I340 Nonrheumatic mitral (valve) insufficiency: Secondary | ICD-10-CM | POA: Diagnosis not present

## 2016-02-27 DIAGNOSIS — R809 Proteinuria, unspecified: Secondary | ICD-10-CM | POA: Diagnosis not present

## 2016-02-27 DIAGNOSIS — M810 Age-related osteoporosis without current pathological fracture: Secondary | ICD-10-CM | POA: Insufficient documentation

## 2016-02-27 DIAGNOSIS — W108XXA Fall (on) (from) other stairs and steps, initial encounter: Secondary | ICD-10-CM | POA: Diagnosis not present

## 2016-02-27 DIAGNOSIS — Z823 Family history of stroke: Secondary | ICD-10-CM | POA: Diagnosis not present

## 2016-02-27 DIAGNOSIS — I10 Essential (primary) hypertension: Secondary | ICD-10-CM | POA: Diagnosis not present

## 2016-02-27 DIAGNOSIS — E119 Type 2 diabetes mellitus without complications: Secondary | ICD-10-CM | POA: Insufficient documentation

## 2016-02-27 DIAGNOSIS — Y92008 Other place in unspecified non-institutional (private) residence as the place of occurrence of the external cause: Secondary | ICD-10-CM | POA: Diagnosis not present

## 2016-02-27 DIAGNOSIS — Z833 Family history of diabetes mellitus: Secondary | ICD-10-CM | POA: Diagnosis not present

## 2016-02-27 DIAGNOSIS — Z7984 Long term (current) use of oral hypoglycemic drugs: Secondary | ICD-10-CM | POA: Diagnosis not present

## 2016-02-27 DIAGNOSIS — Z419 Encounter for procedure for purposes other than remedying health state, unspecified: Secondary | ICD-10-CM

## 2016-02-27 DIAGNOSIS — M1991 Primary osteoarthritis, unspecified site: Secondary | ICD-10-CM | POA: Insufficient documentation

## 2016-02-27 DIAGNOSIS — Z8249 Family history of ischemic heart disease and other diseases of the circulatory system: Secondary | ICD-10-CM | POA: Insufficient documentation

## 2016-02-27 DIAGNOSIS — Z8673 Personal history of transient ischemic attack (TIA), and cerebral infarction without residual deficits: Secondary | ICD-10-CM | POA: Diagnosis not present

## 2016-02-27 DIAGNOSIS — Z79899 Other long term (current) drug therapy: Secondary | ICD-10-CM | POA: Insufficient documentation

## 2016-02-27 HISTORY — PX: KYPHOPLASTY: SHX5884

## 2016-02-27 LAB — POCT I-STAT 4, (NA,K, GLUC, HGB,HCT)
GLUCOSE: 281 mg/dL — AB (ref 65–99)
HCT: 40 % (ref 36.0–46.0)
Hemoglobin: 13.6 g/dL (ref 12.0–15.0)
POTASSIUM: 3.4 mmol/L — AB (ref 3.5–5.1)
Sodium: 138 mmol/L (ref 135–145)

## 2016-02-27 LAB — SURGICAL PCR SCREEN
MRSA, PCR: NEGATIVE
Staphylococcus aureus: NEGATIVE

## 2016-02-27 LAB — GLUCOSE, CAPILLARY
GLUCOSE-CAPILLARY: 268 mg/dL — AB (ref 65–99)
GLUCOSE-CAPILLARY: 231 mg/dL — AB (ref 65–99)

## 2016-02-27 SURGERY — KYPHOPLASTY
Anesthesia: Monitor Anesthesia Care | Wound class: Clean

## 2016-02-27 MED ORDER — KETAMINE HCL 50 MG/ML IJ SOLN
INTRAMUSCULAR | Status: DC | PRN
  Administered 2016-02-27 (×2): 25 mg via INTRAMUSCULAR

## 2016-02-27 MED ORDER — ONDANSETRON HCL 4 MG/2ML IJ SOLN: 4.0000 mg | Freq: Once | INTRAMUSCULAR | Status: DC | PRN

## 2016-02-27 MED ORDER — HYDROCODONE-ACETAMINOPHEN 7.5-325 MG PO TABS: 1.0000 | ORAL_TABLET | Freq: Four times a day (QID) | ORAL | 0 refills | Status: DC | PRN

## 2016-02-27 MED ORDER — LABETALOL HCL 5 MG/ML IV SOLN
10.0000 mg | Freq: Once | INTRAVENOUS | Status: AC
  Administered 2016-02-27: 10 mg via INTRAVENOUS

## 2016-02-27 MED ORDER — IOPAMIDOL (ISOVUE-M 200) INJECTION 41%
INTRAMUSCULAR | Status: AC
  Filled 2016-02-27: qty 20

## 2016-02-27 MED ORDER — LIDOCAINE HCL 1 % IJ SOLN
INTRAMUSCULAR | Status: DC | PRN
  Administered 2016-02-27: 20 mL

## 2016-02-27 MED ORDER — CEFAZOLIN (ANCEF) 1 G IV SOLR: 1.0000 g | INTRAVENOUS | Status: DC

## 2016-02-27 MED ORDER — PROPOFOL 500 MG/50ML IV EMUL
INTRAVENOUS | Status: DC | PRN
  Administered 2016-02-27: 100 ug/kg/min via INTRAVENOUS

## 2016-02-27 MED ORDER — LIDOCAINE HCL (PF) 1 % IJ SOLN
INTRAMUSCULAR | Status: AC
  Filled 2016-02-27: qty 60

## 2016-02-27 MED ORDER — LABETALOL HCL 5 MG/ML IV SOLN
INTRAVENOUS | Status: DC | PRN
  Administered 2016-02-27: 10 mg via INTRAVENOUS

## 2016-02-27 MED ORDER — PROPOFOL 10 MG/ML IV BOLUS
INTRAVENOUS | Status: DC | PRN
  Administered 2016-02-27: 30 mg via INTRAVENOUS

## 2016-02-27 MED ORDER — LABETALOL HCL 5 MG/ML IV SOLN
INTRAVENOUS | Status: AC
  Administered 2016-02-27: 5 mg via INTRAVENOUS
  Filled 2016-02-27: qty 4

## 2016-02-27 MED ORDER — BUPIVACAINE-EPINEPHRINE (PF) 0.5% -1:200000 IJ SOLN
INTRAMUSCULAR | Status: DC | PRN
  Administered 2016-02-27: 10 mL

## 2016-02-27 MED ORDER — SODIUM CHLORIDE 0.9 % IV SOLN
INTRAVENOUS | Status: DC
  Administered 2016-02-27: 11:00:00 via INTRAVENOUS

## 2016-02-27 MED ORDER — LABETALOL HCL 5 MG/ML IV SOLN
INTRAVENOUS | Status: AC
  Administered 2016-02-27: 10 mg via INTRAVENOUS
  Filled 2016-02-27: qty 4

## 2016-02-27 MED ORDER — MIDAZOLAM HCL 2 MG/2ML IJ SOLN
INTRAMUSCULAR | Status: DC | PRN
  Administered 2016-02-27: 2 mg via INTRAVENOUS

## 2016-02-27 MED ORDER — CEFAZOLIN IN D5W 1 GM/50ML IV SOLN
INTRAVENOUS | Status: AC
  Filled 2016-02-27: qty 50

## 2016-02-27 MED ORDER — BUPIVACAINE-EPINEPHRINE (PF) 0.5% -1:200000 IJ SOLN
INTRAMUSCULAR | Status: AC
  Filled 2016-02-27: qty 30

## 2016-02-27 MED ORDER — FENTANYL CITRATE (PF) 100 MCG/2ML IJ SOLN
INTRAMUSCULAR | Status: AC
  Administered 2016-02-27: 25 ug via INTRAVENOUS
  Filled 2016-02-27: qty 2

## 2016-02-27 MED ORDER — CEFAZOLIN IN D5W 1 GM/50ML IV SOLN
1.0000 g | Freq: Once | INTRAVENOUS | Status: AC
  Administered 2016-02-27: 1 g via INTRAVENOUS

## 2016-02-27 MED ORDER — LABETALOL HCL 5 MG/ML IV SOLN
5.0000 mg | Freq: Once | INTRAVENOUS | Status: AC
  Administered 2016-02-27: 5 mg via INTRAVENOUS

## 2016-02-27 MED ORDER — FENTANYL CITRATE (PF) 100 MCG/2ML IJ SOLN
25.0000 ug | INTRAMUSCULAR | Status: DC | PRN
Start: 2016-02-27 — End: 2016-02-27
  Administered 2016-02-27 (×4): 25 ug via INTRAVENOUS

## 2016-02-27 MED ORDER — IOPAMIDOL (ISOVUE-M 200) INJECTION 41%
INTRAMUSCULAR | Status: DC | PRN
  Administered 2016-02-27: 20 mL

## 2016-02-27 SURGICAL SUPPLY — 14 items
CEMENT KYPHON CX01A KIT/MIXER (Cement) ×2 IMPLANT
DEVICE BIOPSY BONE KYPHX (INSTRUMENTS) ×2 IMPLANT
DRAPE C-ARM XRAY 36X54 (DRAPES) ×2 IMPLANT
DURAPREP 26ML APPLICATOR (WOUND CARE) ×2 IMPLANT
GLOVE SURG ORTHO 9.0 STRL STRW (GLOVE) ×2 IMPLANT
GOWN SRG 2XL LVL 4 RGLN SLV (GOWNS) ×1 IMPLANT
GOWN STRL NON-REIN 2XL LVL4 (GOWNS) ×1
GOWN STRL REUS W/ TWL LRG LVL3 (GOWN DISPOSABLE) ×1 IMPLANT
GOWN STRL REUS W/TWL LRG LVL3 (GOWN DISPOSABLE) ×1
LIQUID BAND (GAUZE/BANDAGES/DRESSINGS) ×2 IMPLANT
PACK KYPHOPLASTY (MISCELLANEOUS) ×2 IMPLANT
STRAP SAFETY BODY (MISCELLANEOUS) ×2 IMPLANT
TRAY KYPHOPAK 15/3 EXPRESS 1ST (MISCELLANEOUS) IMPLANT
TRAY KYPHOPAK 20/3 EXPRESS 1ST (MISCELLANEOUS) ×2 IMPLANT

## 2016-02-27 NOTE — Progress Notes (Signed)
Labetalol 10mg  given  For high blood pressure

## 2016-02-27 NOTE — Progress Notes (Signed)
Total of labetalol 25mg  given for blood pressure

## 2016-02-27 NOTE — H&P (Signed)
Reviewed paper H+P, will be scanned into chart. No changes noted.  

## 2016-02-27 NOTE — Discharge Instructions (Addendum)
AMBULATORY SURGERY  DISCHARGE INSTRUCTIONS   1) The drugs that you were given will stay in your system until tomorrow so for the next 24 hours you should not:  A) Drive an automobile B) Make any legal decisions C) Drink any alcoholic beverage   2) You may resume regular meals tomorrow.  Today it is better to start with liquids and gradually work up to solid foods.  You may eat anything you prefer, but it is better to start with liquids, then soup and crackers, and gradually work up to solid foods.   3) Please notify your doctor immediately if you have any unusual bleeding, trouble breathing, redness and pain at the surgery site, drainage, fever, or pain not relieved by medication.    4) Additional Instructions:    AMBULATORY SURGERY  DISCHARGE INSTRUCTIONS   5) The drugs that you were given will stay in your system until tomorrow so for the next 24 hours you should not:  D) Drive an automobile E) Make any legal decisions F) Drink any alcoholic beverage   6) You may resume regular meals tomorrow.  Today it is better to start with liquids and gradually work up to solid foods.  You may eat anything you prefer, but it is better to start with liquids, then soup and crackers, and gradually work up to solid foods.   7) Please notify your doctor immediately if you have any unusual bleeding, trouble breathing, redness and pain at the surgery site, drainage, fever, or pain not relieved by medication.    8) Additional Instructions:        Please contact your physician with any problems or Same Day Surgery at [phone], Monday through Friday 6 am to 4 pm, or [place] at [facility] Main number at [phone].    Please contact your physician with any problems or Same Day Surgery at [phone], Monday through Friday 6 am to 4 pm, or [place] at [facility] Main number at [phone].Remove Band-Aid on Thursday. Activities as tolerated starting tomorrow. Pain medicine  as directed. Should have follow-up appointment in 2 weeks. Okay to shower tomorrow

## 2016-02-27 NOTE — Progress Notes (Signed)
Dr Pernell Dupreadams aware of blood sugar 231  No new orders

## 2016-02-27 NOTE — Progress Notes (Signed)
Blood pressure 234/122   Labetalol 10 mg given

## 2016-02-27 NOTE — Transfer of Care (Signed)
Immediate Anesthesia Transfer of Care Note  Patient: Cheryl Mueller  Procedure(s) Performed: Procedure(s): KYPHOPLASTY (N/A)  Patient Location: PACU  Anesthesia Type:General  Level of Consciousness: awake and sedated  Airway & Oxygen Therapy: Patient Spontanous Breathing and Patient connected to nasal cannula oxygen  Post-op Assessment: Report given to RN and Post -op Vital signs reviewed and stable  Post vital signs: Reviewed and stable  Last Vitals:  Vitals:   02/27/16 1059  BP: (!) 142/83  Pulse: 86  Resp: 16  Temp: 36.7 C    Last Pain:  Vitals:   02/27/16 1059  TempSrc: Tympanic         Complications: No apparent anesthesia complications

## 2016-02-27 NOTE — Anesthesia Preprocedure Evaluation (Signed)
Anesthesia Evaluation  Patient identified by MRN, date of birth, ID band Patient awake    Reviewed: Allergy & Precautions, H&P , NPO status , Patient's Chart, lab work & pertinent test results, reviewed documented beta blocker date and time   Airway Mallampati: II  TM Distance: >3 FB Neck ROM: full    Dental no notable dental hx. (+) Teeth Intact   Pulmonary neg pulmonary ROS,    Pulmonary exam normal breath sounds clear to auscultation       Cardiovascular Exercise Tolerance: Good hypertension, negative cardio ROS   Rhythm:regular Rate:Normal     Neuro/Psych Complains of bilateral lower extr weakness.  Along with balance issues.JA TIAnegative neurological ROS  negative psych ROS   GI/Hepatic negative GI ROS, Neg liver ROS,   Endo/Other  negative endocrine ROSdiabetes  Renal/GU Renal disease     Musculoskeletal   Abdominal   Peds  Hematology negative hematology ROS (+) anemia ,   Anesthesia Other Findings   Reproductive/Obstetrics negative OB ROS                             Anesthesia Physical Anesthesia Plan  ASA: III  Anesthesia Plan: MAC   Post-op Pain Management:    Induction:   Airway Management Planned:   Additional Equipment:   Intra-op Plan:   Post-operative Plan:   Informed Consent: I have reviewed the patients History and Physical, chart, labs and discussed the procedure including the risks, benefits and alternatives for the proposed anesthesia with the patient or authorized representative who has indicated his/her understanding and acceptance.     Plan Discussed with: CRNA  Anesthesia Plan Comments:         Anesthesia Quick Evaluation

## 2016-02-27 NOTE — Op Note (Signed)
02/27/2016  2:21 PM  PATIENT:  Cheryl Mueller  75 y.o. female  PRE-OPERATIVE DIAGNOSIS:  L2 fracture  POST-OPERATIVE DIAGNOSIS:  L2 fracture  PROCEDURE:  Procedure(s): KYPHOPLASTY (N/A)  SURGEON: Leitha SchullerMichael J Janiaya Ryser, MD  ASSISTANTS: None  ANESTHESIA:   local and MAC  EBL:  No intake/output data recorded.  BLOOD ADMINISTERED:none  DRAINS: none   LOCAL MEDICATIONS USED:  MARCAINE    and LIDOCAINE   SPECIMEN:  Source of Specimen:  L2 vertebral body  DISPOSITION OF SPECIMEN:  PATHOLOGY  COUNTS:  YES  TOURNIQUET:  * No tourniquets in log *  IMPLANTS: Bone cement  DICTATION: .Dragon Dictation patient was brought to the operating room and after adequate anesthesia was obtained the patient was placed prone. C-arm was brought in and good visualization was obtained in both AP and lateral projections. Appropriate patient identification timeout procedures carried out with the local local anesthetic infiltrated near the planned incisions on the right left side around L2 after prepping the skin with alcohol. After prepping draping back in the sterile fashion appropriate patient education and timeout procedure was repeated. Spinal needle was used on the right to get local asthenic down to the pedicle and an extra pedicular approach was then made with the L2 vertebral body biopsy obtained. Drilling was carried out followed by inflation of a balloon about 3 to have cc. Proximal before cc of bone cement was infiltrated near the vertebral body with good fill and interdigitation crossed the midline. A second side sig was not deemed necessary. After the cement was adequately set the trochars removed and permanent C-arm views obtained. Dermabond was placed over the incision followed by a Band-Aid  PLAN OF CARE: Discharge to home after PACU  PATIENT DISPOSITION:  PACU - hemodynamically stable.

## 2016-02-27 NOTE — OR Nursing (Signed)
Rash noted on back, chest, arms and legs. Patient states she has seen a dermatologist for.  Patient states it is dermatitis.

## 2016-02-27 NOTE — Anesthesia Procedure Notes (Signed)
Date/Time: 02/27/2016 1:51 PM Performed by: Junious SilkNOLES, Ignacio Lowder Pre-anesthesia Checklist: Patient identified, Emergency Drugs available, Suction available, Patient being monitored and Timeout performed Oxygen Delivery Method: Nasal cannula

## 2016-02-27 NOTE — Progress Notes (Signed)
Dr. Rosita KeaMenz in pre-op , operative site marked aware of rash , MRSA screen negative

## 2016-02-27 NOTE — Anesthesia Postprocedure Evaluation (Signed)
Anesthesia Post Note  Patient: Cheryl Mueller  Procedure(s) Performed: Procedure(s) (LRB): KYPHOPLASTY (N/A)  Patient location during evaluation: PACU Anesthesia Type: General Level of consciousness: awake and alert Pain management: pain level controlled Vital Signs Assessment: post-procedure vital signs reviewed and stable Respiratory status: spontaneous breathing, nonlabored ventilation, respiratory function stable and patient connected to nasal cannula oxygen Cardiovascular status: blood pressure returned to baseline and stable Postop Assessment: no signs of nausea or vomiting Anesthetic complications: no    Last Vitals:  Vitals:   02/27/16 1059 02/27/16 1420  BP: (!) 142/83 (!) 214/88  Pulse: 86 81  Resp: 16 19  Temp: 36.7 C 36.3 C    Last Pain:  Vitals:   02/27/16 1059  TempSrc: Tympanic                 Yevette EdwardsJames G Niki Cosman

## 2016-02-28 NOTE — NC FL2 (Signed)
[place] MEDICAID FL2 LEVEL OF CARE SCREENING TOOL     IDENTIFICATION  Patient Name: Cheryl Mueller Birthdate: Apr 25, 1941 Sex: female Admission Date (Current Location): 02/27/2016  [place] and [place] Number:  Chiropodist and Address:  [facility], [address] [place], [place] [address]      Provider Number: [id number]  Attending Physician Name and Address:  No att. providers found  Relative Name and Phone Number:       Current Level of Care: Other (Comment) (Home ) Recommended Level of Care: Skilled Nursing Facility Prior Approval Number:    Date Approved/Denied:   PASRR Number:  ( [id number] )  Discharge Plan: SNF    Current Diagnoses: Patient Active Problem List   Diagnosis Date Noted  . 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 BP (high blood pressure) 10/20/2015  . HLD (hyperlipidemia) 10/20/2015  . Abdominal wall cellulitis 10/13/2015  . Anemia, iron deficiency 05/08/2015  . Deficiency in the vitamin folic acid 05/08/2015  . Compression fracture of lumbar vertebra (HCC) 05/08/2015  . Acute kidney injury (HCC) 04/20/2015  . Compression fracture of L1 lumbar vertebra (HCC) 04/18/2015  . Acute blood loss anemia 04/18/2015  . Hypokalemia 04/18/2015  . Dehydration 04/18/2015  . Hematoma 04/17/2015  . Cellulitis 04/17/2015  . Benign hypertension 04/17/2015  . Diabetes mellitus (HCC) 04/17/2015  . Cystocele, midline 08/11/2014  . Abnormal presence of protein in urine 06/20/2014  . Type 2 diabetes mellitus with other diabetic kidney complication (HCC) 12/14/2013    Orientation RESPIRATION BLADDER Height & Weight     Self, Time, Place, Situation  Normal Continent Weight: 142 lb (64.4 kg) Height:     BEHAVIORAL SYMPTOMS/MOOD NEUROLOGICAL BOWEL NUTRITION STATUS   (none)  (none) Continent Diet (Regular Diet )  AMBULATORY STATUS COMMUNICATION OF NEEDS Skin   Extensive Assist Verbally Surgical wounds (Incision on Back  (Kypho) )                       Personal Care Assistance Level of Assistance  Bathing, Feeding, Dressing Bathing Assistance: Limited assistance Feeding assistance: Independent Dressing Assistance: Limited assistance     Functional Limitations Info  Sight, Hearing, Speech Sight Info: Adequate Hearing Info: Adequate Speech Info: Adequate    SPECIAL CARE FACTORS FREQUENCY  PT (By licensed PT)     PT Frequency:  (5)              Contractures      Additional Factors Info  Code Status, Allergies, Isolation Precautions Code Status Info:  (Full Code. ) Allergies Info:  (Alendronate, Atorvastatin, Formaldehyde)     Isolation Precautions Info:  (MRSA Nasal Swab. )     Current Medications (02/28/2016):  This is the current hospital active medication list No current facility-administered medications for this encounter.    Current Outpatient Prescriptions  Medication Sig Dispense Refill  . aspirin-acetaminophen-caffeine (EXCEDRIN MIGRAINE) 250-250-65 MG tablet Take 2 tablets by mouth every 6 (six) hours as needed for headache.    . hydrochlorothiazide (MICROZIDE) 12.5 MG capsule Take 12.5 mg by mouth daily.     Marland Kitchen HYDROcodone-acetaminophen (NORCO) 7.5-325 MG tablet Take 1 tablet by mouth every 6 (six) hours as needed for moderate pain or severe pain. 20 tablet 0  . hydrOXYzine (ATARAX/VISTARIL) 25 MG tablet Take 25 mg by mouth 3 (three) times daily as needed.    Marland Kitchen amLODipine (NORVASC) 10 MG tablet Take 10 mg by mouth daily.     Marland Kitchen aspirin EC  81 MG tablet Take 81 mg by mouth daily.    Marland Kitchen. atorvastatin (LIPITOR) 80 MG tablet Take 80 mg by mouth at bedtime.    . Calcium Carbonate-Vitamin D (CALCIUM 600+D) 600-400 MG-UNIT tablet Take 1 tablet by mouth 2 (two) times daily.    Marland Kitchen. glimepiride (AMARYL) 4 MG tablet Take 1 tablet (4 mg total) by mouth daily with breakfast. 30 tablet 11  . HYDROcodone-acetaminophen (NORCO) 7.5-325 MG tablet Take 1 tablet by mouth every 6 (six) hours as  needed for moderate pain. 30 tablet 0  . Ibuprofen-Diphenhydramine Cit (IBUPROFEN PM) 200-38 MG TABS Take 2 tablets by mouth at bedtime as needed (for sleep).    Marland Kitchen. lidocaine (LIDODERM) 5 % Place 1 patch onto the skin every 12 (twelve) hours. Remove & Discard patch within 12 hours or as directed by MD 15 patch 0  . lisinopril (PRINIVIL,ZESTRIL) 40 MG tablet Take 40 mg by mouth 2 (two) times daily.     . metFORMIN (GLUCOPHAGE) 1000 MG tablet Take 1,000 mg by mouth 2 (two) times daily with a meal.    . mometasone (ELOCON) 0.1 % cream Apply 1 application topically daily as needed (for itching).    . potassium chloride (K-DUR,KLOR-CON) 10 MEQ tablet Take 1 tablet by mouth daily.    Marland Kitchen. rOPINIRole (REQUIP) 0.25 MG tablet Take 1 tablet (0.25 mg total) by mouth at bedtime. 30 tablet 1  . sitaGLIPtin (JANUVIA) 100 MG tablet Take 1 tablet by mouth daily.    Marland Kitchen. sulfamethoxazole-trimethoprim (BACTRIM DS,SEPTRA DS) 800-160 MG tablet Take 1 tablet by mouth 2 (two) times daily.  0  . traZODone (DESYREL) 50 MG tablet Take 0.5 tablets (25 mg total) by mouth at bedtime. (Patient not taking: Reported on 02/27/2016) 30 tablet 0     Discharge Medications: Please see discharge summary for a list of discharge medications.  Relevant Imaging Results:  Relevant Lab Results:   Additional Information  (SSN: [id number])  Belma Dyches, Darleen CrockerBailey M, LCSW

## 2016-02-28 NOTE — Progress Notes (Signed)
Clinical Social Worker (CSW) received a call from Dr. Neomia GlassMenz's office stating that patient's son Karl LukeSean Melick [phone] called the office this morning 02/28/16 requesting that [facility] because she is not doing well after they kypho. CSW attempted to call Gregary SignsSean however he did not answer and a voicemail was left. CSW also left a voicemail for patient's husband [name]. CSW contacted patient and spoke with her directly. Per patient she is doing much better today and can walk. Patient reported that she does not need to go to SNF. Please reconsult if future social work needs arise. CSW signing off.   Baker Hughes IncorporatedBailey Rolonda Pontarelli, LCSW [phone]

## 2016-02-29 LAB — SURGICAL PATHOLOGY

## 2016-03-01 ENCOUNTER — Ambulatory Visit: Admission: RE | Admit: 2016-03-01 | Payer: Medicare Other | Source: Ambulatory Visit

## 2016-03-08 ENCOUNTER — Other Ambulatory Visit: Payer: Self-pay | Admitting: Orthopedic Surgery

## 2016-03-08 DIAGNOSIS — M47816 Spondylosis without myelopathy or radiculopathy, lumbar region: Secondary | ICD-10-CM

## 2016-03-11 ENCOUNTER — Ambulatory Visit: Admission: RE | Admit: 2016-03-11 | Payer: Medicare Other | Source: Ambulatory Visit

## 2016-03-11 ENCOUNTER — Ambulatory Visit: Payer: Medicare Other | Attending: Orthopedic Surgery

## 2016-03-15 ENCOUNTER — Ambulatory Visit: Payer: Medicare Other

## 2016-03-25 ENCOUNTER — Emergency Department
Admission: EM | Admit: 2016-03-25 | Discharge: 2016-03-25 | Disposition: A | Discharge status: Home / Self Care | Payer: Medicare Other | Attending: Emergency Medicine | Admitting: Emergency Medicine

## 2016-03-25 DIAGNOSIS — L02811 Cutaneous abscess of head [any part, except face]: Secondary | ICD-10-CM | POA: Insufficient documentation

## 2016-03-25 DIAGNOSIS — Z8673 Personal history of transient ischemic attack (TIA), and cerebral infarction without residual deficits: Secondary | ICD-10-CM | POA: Insufficient documentation

## 2016-03-25 DIAGNOSIS — Z7984 Long term (current) use of oral hypoglycemic drugs: Secondary | ICD-10-CM | POA: Diagnosis not present

## 2016-03-25 DIAGNOSIS — E119 Type 2 diabetes mellitus without complications: Secondary | ICD-10-CM | POA: Insufficient documentation

## 2016-03-25 DIAGNOSIS — I10 Essential (primary) hypertension: Secondary | ICD-10-CM | POA: Insufficient documentation

## 2016-03-25 DIAGNOSIS — Z79899 Other long term (current) drug therapy: Secondary | ICD-10-CM | POA: Diagnosis not present

## 2016-03-25 DIAGNOSIS — Z7982 Long term (current) use of aspirin: Secondary | ICD-10-CM | POA: Insufficient documentation

## 2016-03-25 DIAGNOSIS — L0291 Cutaneous abscess, unspecified: Secondary | ICD-10-CM

## 2016-03-25 LAB — BASIC METABOLIC PANEL
Anion gap: 10 (ref 5–15)
BUN: 11 mg/dL (ref 6–20)
CHLORIDE: 101 mmol/L (ref 101–111)
CO2: 27 mmol/L (ref 22–32)
Calcium: 9.3 mg/dL (ref 8.9–10.3)
Creatinine, Ser: 0.58 mg/dL (ref 0.44–1.00)
GFR calc Af Amer: >60 mL/min (ref >60)
GLUCOSE: 193 mg/dL — AB (ref 65–99)
POTASSIUM: 3.2 mmol/L — AB (ref 3.5–5.1)
SODIUM: 138 mmol/L (ref 135–145)

## 2016-03-25 LAB — CBC
HEMATOCRIT: 41 % (ref 35.0–47.0)
Hemoglobin: 13.6 g/dL (ref 12.0–16.0)
MCH: 29.1 pg (ref 26.0–34.0)
MCHC: 33.1 g/dL (ref 32.0–36.0)
MCV: 87.8 fL (ref 80.0–100.0)
PLATELETS: 275 10*3/uL (ref 150–440)
RBC: 4.67 MIL/uL (ref 3.80–5.20)
RDW: 13.3 % (ref 11.5–14.5)
WBC: 8.1 10*3/uL (ref 3.6–11.0)

## 2016-03-25 MED ORDER — OXYCODONE-ACETAMINOPHEN 5-325 MG PO TABS
1.0000 | ORAL_TABLET | ORAL | Status: DC | PRN
  Administered 2016-03-25: 1 via ORAL

## 2016-03-25 MED ORDER — HYDROMORPHONE HCL 2 MG PO TABS
2.0000 mg | ORAL_TABLET | Freq: Once | ORAL | Status: AC
  Administered 2016-03-25: 2 mg via ORAL

## 2016-03-25 MED ORDER — DOXYCYCLINE MONOHYDRATE 100 MG PO TABS: 100.0000 mg | ORAL_TABLET | Freq: Two times a day (BID) | ORAL | 0 refills | Status: AC

## 2016-03-25 MED ORDER — HYDROMORPHONE HCL 2 MG PO TABS
ORAL_TABLET | ORAL | Status: AC
  Filled 2016-03-25: qty 1

## 2016-03-25 MED ORDER — LIDOCAINE-EPINEPHRINE (PF) 1 %-1:200000 IJ SOLN
INTRAMUSCULAR | Status: AC
  Filled 2016-03-25: qty 30

## 2016-03-25 MED ORDER — OXYCODONE-ACETAMINOPHEN 5-325 MG PO TABS
ORAL_TABLET | ORAL | Status: AC
  Filled 2016-03-25: qty 1

## 2016-03-25 MED ORDER — LIDOCAINE HCL (PF) 1 % IJ SOLN
INTRAMUSCULAR | Status: AC
  Filled 2016-03-25: qty 5

## 2016-03-25 MED ORDER — HYDROMORPHONE HCL 2 MG PO TABS: 2.0000 mg | ORAL_TABLET | Freq: Two times a day (BID) | ORAL | 0 refills | Status: DC | PRN

## 2016-03-25 NOTE — Discharge Instructions (Signed)
Please return immediately if condition worsens. Please contact her primary physician or the physician you were given for referral. If you have any specialist physicians involved in her treatment and plan please also contact them. Thank you for using [facility].  Please take over-the-counter ibuprofen and/or Tylenol for pain as tolerated. Return emergency department especially for fever. Packing should stay in in 3 days and then you can remove the entire packing material. He should have the wound rechecked later this week if possible through your primary physician. Keep close attention to your blood sugars as any form of infection can increase her blood sugar. Side effects of the antibiotic include stomach irritation and sensitivity to sunlight. Please have plenty of sunscreen if out in direct sunlight as if he don't with the antibiotic and may create a sunburn.

## 2016-03-25 NOTE — ED Notes (Signed)
Pt presents with abscess to top of head x 2 weeks. Pt has hx of MRSA in May. Pt very vocal about her pain; requesting pain medication to "make it go away." Pt alert & oriented.

## 2016-03-25 NOTE — ED Provider Notes (Signed)
Time Seen: Approximately 1433  I have reviewed the triage notes  Chief Complaint: Abscess   History of Present Illness: Cheryl Mueller is a 75 y.o. female who presents with an abscess over the left parietal area which she's had now for approximately 2 weeks. Patient does have a history of diabetes. She's not had any fever at home and her main concerns seem to be pain from the abscess area. Patient was seen in outpatient clinic and referred here to the emergency department for further evaluation. His noticed some drainage from the area with increased swelling over the last 2 weeks.   Past Medical History:  Diagnosis Date  . Diabetes mellitus without complication (HCC)    Non Insulin dependant  . Hyperlipemia   . Hypertension   . TIA (transient ischemic attack)    approx 15 years ago  . Vertigo    hx of    Patient Active Problem List   Diagnosis Date Noted  . 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 BP (high blood pressure) 10/20/2015  . HLD (hyperlipidemia) 10/20/2015  . Abdominal wall cellulitis 10/13/2015  . Anemia, iron deficiency 05/08/2015  . Deficiency in the vitamin folic acid 05/08/2015  . Compression fracture of lumbar vertebra (HCC) 05/08/2015  . Acute kidney injury (HCC) 04/20/2015  . Compression fracture of L1 lumbar vertebra (HCC) 04/18/2015  . Acute blood loss anemia 04/18/2015  . Hypokalemia 04/18/2015  . Dehydration 04/18/2015  . Hematoma 04/17/2015  . Cellulitis 04/17/2015  . Benign hypertension 04/17/2015  . Diabetes mellitus (HCC) 04/17/2015  . Cystocele, midline 08/11/2014  . Abnormal presence of protein in urine 06/20/2014  . Type 2 diabetes mellitus with other diabetic kidney complication 12/14/2013    Past Surgical History:  Procedure Laterality Date  . COLONOSCOPY W/ POLYPECTOMY    . DEBRIDEMENT OF ABDOMINAL WALL ABSCESS N/A 10/16/2015   Procedure: I & D OF ABDOMINAL WALL ABSCESS;  Surgeon: Lattie Haw, MD;  Location: [facility];   Service: General;  Laterality: N/A;  . KYPHOPLASTY N/A 04/19/2015   Procedure: KYPHOPLASTY L 1;  Surgeon: Kennedy Bucker, MD;  Location: [facility];  Service: Orthopedics;  Laterality: N/A;  . KYPHOPLASTY N/A 02/27/2016   Procedure: KYPHOPLASTY;  Surgeon: Kennedy Bucker, MD;  Location: [facility];  Service: Orthopedics;  Laterality: N/A;  . LUMBAR LAMINECTOMY/DECOMPRESSION MICRODISCECTOMY Right 11/26/2012   Procedure: LUMBAR LAMINECTOMY/DECOMPRESSION MICRODISCECTOMY 1 LEVEL;  Surgeon: Hewitt Shorts, MD;  Location: [facility];  Service: Neurosurgery;  Laterality: Right;  Lumbar five-sacral one laminotomy and microdiskectomy   . ORIF PATELLA Left 03/23/2015   Procedure: OPEN REDUCTION INTERNAL (ORIF) FIXATION PATELLA;  Surgeon: Kennedy Bucker, MD;  Location: [facility];  Service: Orthopedics;  Laterality: Left;    Past Surgical History:  Procedure Laterality Date  . COLONOSCOPY W/ POLYPECTOMY    . DEBRIDEMENT OF ABDOMINAL WALL ABSCESS N/A 10/16/2015   Procedure: I & D OF ABDOMINAL WALL ABSCESS;  Surgeon: Lattie Haw, MD;  Location: [facility];  Service: General;  Laterality: N/A;  . KYPHOPLASTY N/A 04/19/2015   Procedure: KYPHOPLASTY L 1;  Surgeon: Kennedy Bucker, MD;  Location: [facility];  Service: Orthopedics;  Laterality: N/A;  . KYPHOPLASTY N/A 02/27/2016   Procedure: KYPHOPLASTY;  Surgeon: Kennedy Bucker, MD;  Location: [facility];  Service: Orthopedics;  Laterality: N/A;  . LUMBAR LAMINECTOMY/DECOMPRESSION MICRODISCECTOMY Right 11/26/2012   Procedure: LUMBAR LAMINECTOMY/DECOMPRESSION MICRODISCECTOMY 1 LEVEL;  Surgeon: Hewitt Shorts, MD;  Location: [facility];  Service: Neurosurgery;  Laterality: Right;  Lumbar  five-sacral one laminotomy and microdiskectomy   . ORIF PATELLA Left 03/23/2015   Procedure: OPEN REDUCTION INTERNAL (ORIF) FIXATION PATELLA;  Surgeon: Kennedy BuckerMichael Menz, MD;  Location: [facility];  Service: Orthopedics;  Laterality: Left;    Current Outpatient Rx  . Order #: [id number] Class:  Historical Med  . Order #: [id number] Class: Historical Med  . Order #: [id number] Class: Historical Med  . Order #: [id number] Class: Historical Med  . Order #: [id number] Class: Historical Med  . Order #: [id number] Class: Print  . Order #: [id number] Class: No Print  . Order #: [id number] Class: Historical Med  . Order #: [id number] Class: Print  . Order #: [id number] Class: Print  . Order #: [id number] Class: Print  . Order #: [id number] Class: Historical Med  . Order #: [id number] Class: Historical Med  . Order #: [id number] Class: Print  . Order #: [id number] Class: Historical Med  . Order #: [id number] Class: Historical Med  . Order #: [id number] Class: Historical Med  . Order #: [id number] Class: Historical Med  . Order #: [id number] Class: No Print  . Order #: [id number] Class: Historical Med  . Order #: [id number] Class: Historical Med  . Order #: [id number] Class: Print    Allergies:  Alendronate; Atorvastatin; and Formaldehyde  Family History: Family History  Problem Relation Age of Onset  . Stroke Mother   . Hypertension Mother   . Diabetes Father   . Breast cancer Neg Hx     Social History: Social History  Substance Use Topics  . Smoking status: Never Smoker  . Smokeless tobacco: Never Used  . Alcohol use No     Review of Systems:   10 point review of systems was performed and was otherwise negative:  Constitutional: No fever Eyes: No visual disturbances ENT: No sore throat, ear pain Cardiac: No chest pain Respiratory: No shortness of breath, wheezing, or stridor Abdomen: No abdominal pain, no vomiting, No diarrhea Endocrine: No weight loss, No night sweats Extremities: No peripheral edema, cyanosis Skin: No rashes, easy bruising Neurologic: No focal weakness, trouble with speech or swollowing Urologic: No dysuria, Hematuria, or urinary frequency   Physical Exam:  ED Triage Vitals  Enc Vitals Group     BP 03/25/16 1152 (!) 179/64     Pulse Rate 03/25/16 1152 96     Resp 03/25/16  1152 18     Temp 03/25/16 1152 97.7 F (36.5 C)     Temp Source 03/25/16 1152 Oral     SpO2 03/25/16 1152 97 %     Weight 03/25/16 1153 132 lb (59.9 kg)     Height 03/25/16 1153 4\' 11"  (1.499 m)     Head Circumference --      Peak Flow --      Pain Score 03/25/16 1153 10     Pain Loc --      Pain Edu? --      Excl. in GC? --     General: Awake , Alert , and Oriented times 3; GCS 15 Head: Normal cephalic , atraumatic 4 cm oval-shaped abscess/furuncle, with areas of drainage. Matted hair over the area of discharge and drainage. Mild surrounding erythema and tender to palpation. Eyes: Pupils equal , round, reactive to light Nose/Throat: No nasal drainage, patent upper airway without erythema or exudate.  Neck: Supple, Full range of motion, No anterior adenopathy or palpable thyroid masses Lungs: Clear to ascultation without wheezes , rhonchi, or rales Heart: Regular rate, regular rhythm without murmurs , gallops , or rubs Abdomen: Soft, non tender without rebound, guarding , or rigidity; bowel sounds positive and  symmetric in all 4 quadrants. No organomegaly .        Extremities: 2 plus symmetric pulses. No edema, clubbing or cyanosis Neurologic: normal ambulation, Motor symmetric without deficits, sensory intact Skin: No other abscesses have been noted Labs:   All laboratory work was reviewed including any pertinent negatives or positives listed below:  Labs Reviewed  BASIC METABOLIC PANEL - Abnormal; Notable for the following:       Result Value   Potassium 3.2 (*)    Glucose, Bld 193 (*)    All other components within normal limits  CBC   Procedures:  Left parietal area was prepped and draped in sterile fashion anesthetized locally 2% lidocaine with epinephrine. 14 blade scalp laceration for incision and drainage was performed with probing and opening of abscess with forceps. There was some drainage of indurated purulent material along with some mild liquid material. The  patient tolerated the procedure well and a packing was placed.   Assessment: * Abscess left parietal region Incision and drainage   Plan: * Outpatient " New Prescriptions   DOXYCYCLINE (ADOXA) 100 MG TABLET    Take 1 tablet (100 mg total) by mouth 2 (two) times daily.   HYDROMORPHONE (DILAUDID) 2 MG TABLET    Take 1 tablet (2 mg total) by mouth every 12 (twelve) hours as needed for severe pain.  " Patient was advised to return immediately if condition worsens. Patient was advised to follow up with their primary care physician or other specialized physicians involved in their outpatient care. The patient and/or family member/power of attorney had laboratory results reviewed at the bedside. All questions and concerns were addressed and appropriate discharge instructions were distributed by the nursing staff.             Jennye Moccasin, MD 03/25/16 1536

## 2016-03-25 NOTE — ED Notes (Signed)
Pt alert and oriented X4, active, cooperative, pt in NAD. RR even and unlabored, color WNL.  Pt informed to return if any life threatening symptoms occur.   

## 2016-03-25 NOTE — ED Triage Notes (Signed)
[facility] was [facility] with [facility]/o abscess to there top of the head for the past 2 weeks.. States today is the first time she had sought any medical attention..Marland Kitchen

## 2016-03-25 NOTE — ED Notes (Signed)
Wound covered.

## 2016-04-05 ENCOUNTER — Emergency Department: Payer: Medicare Other

## 2016-04-05 ENCOUNTER — Emergency Department
Admission: EM | Admit: 2016-04-05 | Discharge: 2016-04-05 | Disposition: A | Discharge status: Home / Self Care | Payer: Medicare Other | Attending: Emergency Medicine | Admitting: Emergency Medicine

## 2016-04-05 ENCOUNTER — Encounter: Payer: Self-pay | Admitting: Emergency Medicine

## 2016-04-05 DIAGNOSIS — S5002XA Contusion of left elbow, initial encounter: Secondary | ICD-10-CM | POA: Insufficient documentation

## 2016-04-05 DIAGNOSIS — Z79899 Other long term (current) drug therapy: Secondary | ICD-10-CM | POA: Insufficient documentation

## 2016-04-05 DIAGNOSIS — Y9389 Activity, other specified: Secondary | ICD-10-CM | POA: Diagnosis not present

## 2016-04-05 DIAGNOSIS — I10 Essential (primary) hypertension: Secondary | ICD-10-CM | POA: Diagnosis not present

## 2016-04-05 DIAGNOSIS — Z7982 Long term (current) use of aspirin: Secondary | ICD-10-CM | POA: Diagnosis not present

## 2016-04-05 DIAGNOSIS — Y999 Unspecified external cause status: Secondary | ICD-10-CM | POA: Diagnosis not present

## 2016-04-05 DIAGNOSIS — Z791 Long term (current) use of non-steroidal anti-inflammatories (NSAID): Secondary | ICD-10-CM | POA: Diagnosis not present

## 2016-04-05 DIAGNOSIS — W1839XA Other fall on same level, initial encounter: Secondary | ICD-10-CM | POA: Insufficient documentation

## 2016-04-05 DIAGNOSIS — Z7984 Long term (current) use of oral hypoglycemic drugs: Secondary | ICD-10-CM | POA: Insufficient documentation

## 2016-04-05 DIAGNOSIS — E119 Type 2 diabetes mellitus without complications: Secondary | ICD-10-CM | POA: Diagnosis not present

## 2016-04-05 DIAGNOSIS — S6992XA Unspecified injury of left wrist, hand and finger(s), initial encounter: Secondary | ICD-10-CM | POA: Diagnosis present

## 2016-04-05 DIAGNOSIS — S50312A Abrasion of left elbow, initial encounter: Secondary | ICD-10-CM

## 2016-04-05 DIAGNOSIS — Y92512 Supermarket, store or market as the place of occurrence of the external cause: Secondary | ICD-10-CM | POA: Insufficient documentation

## 2016-04-05 MED ORDER — BACITRACIN ZINC 500 UNIT/GM EX OINT
TOPICAL_OINTMENT | Freq: Two times a day (BID) | CUTANEOUS | Status: DC
  Administered 2016-04-05: 17:00:00 via TOPICAL
  Filled 2016-04-05: qty 0.9

## 2016-04-05 NOTE — ED Notes (Signed)
Bacitracin and clean dressing applied to left elebow area
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
## 2016-04-05 NOTE — ED Notes (Signed)
Pt assisted to bed, states she fell at the grocery store and now has left elbow pain, no swelling or deformity noted

## 2016-04-05 NOTE — Discharge Instructions (Signed)
Complete light range of motion with the left elbow 4 times daily. Do not use the arm sling for more than 48 hours and you should be taking it off to complete exercises 4 times daily.

## 2016-04-05 NOTE — ED Triage Notes (Signed)
Left elbow pain.  States she injured it at the grocery store.

## 2016-04-08 ENCOUNTER — Emergency Department
Admission: EM | Admit: 2016-04-08 | Discharge: 2016-04-08 | Disposition: A | Discharge status: Home / Self Care | Payer: Medicare Other | Attending: Emergency Medicine | Admitting: Emergency Medicine

## 2016-04-08 ENCOUNTER — Encounter: Payer: Self-pay | Admitting: Emergency Medicine

## 2016-04-08 DIAGNOSIS — Y929 Unspecified place or not applicable: Secondary | ICD-10-CM | POA: Insufficient documentation

## 2016-04-08 DIAGNOSIS — W2209XA Striking against other stationary object, initial encounter: Secondary | ICD-10-CM | POA: Diagnosis not present

## 2016-04-08 DIAGNOSIS — Z79899 Other long term (current) drug therapy: Secondary | ICD-10-CM | POA: Insufficient documentation

## 2016-04-08 DIAGNOSIS — Z792 Long term (current) use of antibiotics: Secondary | ICD-10-CM | POA: Diagnosis not present

## 2016-04-08 DIAGNOSIS — Y999 Unspecified external cause status: Secondary | ICD-10-CM | POA: Diagnosis not present

## 2016-04-08 DIAGNOSIS — I10 Essential (primary) hypertension: Secondary | ICD-10-CM | POA: Insufficient documentation

## 2016-04-08 DIAGNOSIS — Y939 Activity, unspecified: Secondary | ICD-10-CM | POA: Insufficient documentation

## 2016-04-08 DIAGNOSIS — Z7984 Long term (current) use of oral hypoglycemic drugs: Secondary | ICD-10-CM | POA: Diagnosis not present

## 2016-04-08 DIAGNOSIS — Z791 Long term (current) use of non-steroidal anti-inflammatories (NSAID): Secondary | ICD-10-CM | POA: Insufficient documentation

## 2016-04-08 DIAGNOSIS — Z7982 Long term (current) use of aspirin: Secondary | ICD-10-CM | POA: Diagnosis not present

## 2016-04-08 DIAGNOSIS — S42202A Unspecified fracture of upper end of left humerus, initial encounter for closed fracture: Secondary | ICD-10-CM | POA: Diagnosis not present

## 2016-04-08 DIAGNOSIS — E119 Type 2 diabetes mellitus without complications: Secondary | ICD-10-CM | POA: Diagnosis not present

## 2016-04-08 DIAGNOSIS — S4992XA Unspecified injury of left shoulder and upper arm, initial encounter: Secondary | ICD-10-CM | POA: Diagnosis present

## 2016-04-08 DIAGNOSIS — S42212A Unspecified displaced fracture of surgical neck of left humerus, initial encounter for closed fracture: Secondary | ICD-10-CM

## 2016-04-08 MED ORDER — HYDROMORPHONE HCL 1 MG/ML IJ SOLN
0.5000 mg | Freq: Once | INTRAMUSCULAR | Status: AC
  Administered 2016-04-08: 0.5 mg via INTRAVENOUS

## 2016-04-08 MED ORDER — OXYCODONE-ACETAMINOPHEN 5-325 MG PO TABS: 1.0000 | ORAL_TABLET | Freq: Four times a day (QID) | ORAL | 0 refills | Status: DC | PRN

## 2016-04-08 MED ORDER — MORPHINE SULFATE (PF) 4 MG/ML IV SOLN
4.0000 mg | Freq: Once | INTRAVENOUS | Status: AC
  Administered 2016-04-08: 4 mg via INTRAVENOUS
  Filled 2016-04-08: qty 1

## 2016-04-08 MED ORDER — HYDROMORPHONE HCL 1 MG/ML IJ SOLN
INTRAMUSCULAR | Status: AC
  Administered 2016-04-08: 0.5 mg via INTRAVENOUS
  Filled 2016-04-08: qty 1

## 2016-04-08 NOTE — ED Notes (Signed)
Pt c/o pain to L arm and asking for pain meds. Informed pt RN will be in in a few min to talk with her and to get her some pain meds ordered. When asked pain level, pt states "close to a 10." Pt in bed waiting on RN to come in.
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
## 2016-04-08 NOTE — ED Notes (Signed)
Pt reports that she was seen here a few days ago for a bruised elbow. Pt states that she followed up with PA for Dr. Graciela HusbandsKlein, who ordered shoulder xray, revealing fractured shoulder. She indicates that she was told she would be admitted to the hospital for possible surgery to the shoulder. Pt alert & oriented.

## 2016-04-08 NOTE — Discharge Instructions (Signed)
Please seek medical attention for any high fevers, chest pain, shortness of breath, change in behavior, persistent vomiting, bloody stool or any other new or concerning symptoms.  

## 2016-04-08 NOTE — ED Notes (Signed)
Pt discharged home after verbalizing understanding of discharge instructions; nad noted. 

## 2016-04-08 NOTE — ED Provider Notes (Signed)
[facility] Emergency Department Provider Note   ____________________________________________   I have reviewed the triage vital signs and the nursing notes.   HISTORY  Chief Complaint Left shoulder blade fracture, pain  History limited by: Not Limited   HPI Cheryl Mueller is a 75 y.o. female who presents from PCP doctors office today because of plan for patient to be admitted to the hospital. Patient went to PCP to follow up for left shoulder pain. Patient had a fall and was seen in the ED 3 days ago. At that time x-rays of the elbow were done which were negative. Today patient states she had x-rays done of her shoulder which showed a scapula fracture. Per patient her PCP contacted orthopedics who recommended admission for surgery.   Past Medical History:  Diagnosis Date  . Diabetes mellitus without complication (HCC)    Non Insulin dependant  . Hyperlipemia   . Hypertension   . TIA (transient ischemic attack)    approx 15 years ago  . Vertigo    hx of    Patient Active Problem List   Diagnosis Date Noted  . 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 BP (high blood pressure) 10/20/2015  . HLD (hyperlipidemia) 10/20/2015  . Abdominal wall cellulitis 10/13/2015  . Anemia, iron deficiency 05/08/2015  . Deficiency in the vitamin folic acid 05/08/2015  . Compression fracture of lumbar vertebra (HCC) 05/08/2015  . Acute kidney injury (HCC) 04/20/2015  . Compression fracture of L1 lumbar vertebra (HCC) 04/18/2015  . Acute blood loss anemia 04/18/2015  . Hypokalemia 04/18/2015  . Dehydration 04/18/2015  . Hematoma 04/17/2015  . Cellulitis 04/17/2015  . Benign hypertension 04/17/2015  . Diabetes mellitus (HCC) 04/17/2015  . Cystocele, midline 08/11/2014  . Abnormal presence of protein in urine 06/20/2014  . Type 2 diabetes mellitus with other diabetic kidney complication 12/14/2013    Past Surgical History:  Procedure Laterality Date  .  COLONOSCOPY W/ POLYPECTOMY    . DEBRIDEMENT OF ABDOMINAL WALL ABSCESS N/A 10/16/2015   Procedure: I & D OF ABDOMINAL WALL ABSCESS;  Surgeon: Lattie Haw, MD;  Location: [facility] ORS;  Service: General;  Laterality: N/A;  . KYPHOPLASTY N/A 04/19/2015   Procedure: KYPHOPLASTY L 1;  Surgeon: Kennedy Bucker, MD;  Location: [facility] ORS;  Service: Orthopedics;  Laterality: N/A;  . KYPHOPLASTY N/A 02/27/2016   Procedure: KYPHOPLASTY;  Surgeon: Kennedy Bucker, MD;  Location: [facility] ORS;  Service: Orthopedics;  Laterality: N/A;  . LUMBAR LAMINECTOMY/DECOMPRESSION MICRODISCECTOMY Right 11/26/2012   Procedure: LUMBAR LAMINECTOMY/DECOMPRESSION MICRODISCECTOMY 1 LEVEL;  Surgeon: Hewitt Shorts, MD;  Location: [facility];  Service: Neurosurgery;  Laterality: Right;  Lumbar five-sacral one laminotomy and microdiskectomy   . ORIF PATELLA Left 03/23/2015   Procedure: OPEN REDUCTION INTERNAL (ORIF) FIXATION PATELLA;  Surgeon: Kennedy Bucker, MD;  Location: [facility] ORS;  Service: Orthopedics;  Laterality: Left;    Prior to Admission medications   Medication Sig Start Date End Date Taking? Authorizing Provider  amLODipine (NORVASC) 10 MG tablet Take 10 mg by mouth daily.     Historical Provider, MD  aspirin EC 81 MG tablet Take 81 mg by mouth daily.    Historical Provider, MD  aspirin-acetaminophen-caffeine (EXCEDRIN MIGRAINE) [phone] MG tablet Take 2 tablets by mouth every 6 (six) hours as needed for headache.    Historical Provider, MD  atorvastatin (LIPITOR) 80 MG tablet Take 80 mg by mouth at bedtime.    Historical Provider, MD  Calcium Carbonate-Vitamin D (CALCIUM 600+D) 600-400 MG-UNIT tablet  Take 1 tablet by mouth 2 (two) times daily.    Historical Provider, MD  glimepiride (AMARYL) 4 MG tablet Take 1 tablet (4 mg total) by mouth daily with breakfast. 04/21/15   Curtis SitesBert J Klein III, MD  hydrochlorothiazide (MICROZIDE) 12.5 MG capsule Take 12.5 mg by mouth daily.     Historical Provider, MD  HYDROcodone-acetaminophen  (NORCO) 7.5-325 MG tablet Take 1 tablet by mouth every 6 (six) hours as needed for moderate pain or severe pain. 10/18/15   Enid Baasadhika Kalisetti, MD  HYDROcodone-acetaminophen (NORCO) 7.5-325 MG tablet Take 1 tablet by mouth every 6 (six) hours as needed for moderate pain. 02/27/16   Kennedy BuckerMichael Menz, MD  HYDROmorphone (DILAUDID) 2 MG tablet Take 1 tablet (2 mg total) by mouth every 12 (twelve) hours as needed for severe pain. 03/25/16   Jennye MoccasinBrian S Quigley, MD  hydrOXYzine (ATARAX/VISTARIL) 25 MG tablet Take 25 mg by mouth 3 (three) times daily as needed.    Historical Provider, MD  Ibuprofen-Diphenhydramine Cit (IBUPROFEN PM) 200-38 MG TABS Take 2 tablets by mouth at bedtime as needed (for sleep).    Historical Provider, MD  lidocaine (LIDODERM) 5 % Place 1 patch onto the skin every 12 (twelve) hours. Remove & Discard patch within 12 hours or as directed by MD 02/19/16   Sharman CheekPhillip Stafford, MD  lisinopril (PRINIVIL,ZESTRIL) 40 MG tablet Take 40 mg by mouth 2 (two) times daily.     Historical Provider, MD  metFORMIN (GLUCOPHAGE) 1000 MG tablet Take 1,000 mg by mouth 2 (two) times daily with a meal.    Historical Provider, MD  mometasone (ELOCON) 0.1 % cream Apply 1 application topically daily as needed (for itching).    Historical Provider, MD  potassium chloride (K-DUR,KLOR-CON) 10 MEQ tablet Take 1 tablet by mouth daily. 05/10/15   Historical Provider, MD  rOPINIRole (REQUIP) 0.25 MG tablet Take 1 tablet (0.25 mg total) by mouth at bedtime. 04/21/15   Curtis SitesBert J Klein III, MD  sitaGLIPtin (JANUVIA) 100 MG tablet Take 1 tablet by mouth daily. 09/05/15 09/04/16  Historical Provider, MD  sulfamethoxazole-trimethoprim (BACTRIM DS,SEPTRA DS) 800-160 MG tablet Take 1 tablet by mouth 2 (two) times daily. 10/18/15   Historical Provider, MD  traZODone (DESYREL) 50 MG tablet Take 0.5 tablets (25 mg total) by mouth at bedtime. Patient not taking: Reported on 02/27/2016 10/18/15   Enid Baasadhika Kalisetti, MD    Allergies Alendronate;  Atorvastatin; and Formaldehyde  Family History  Problem Relation Age of Onset  . Stroke Mother   . Hypertension Mother   . Diabetes Father   . Breast cancer Neg Hx     Social History Social History  Substance Use Topics  . Smoking status: Never Smoker  . Smokeless tobacco: Never Used  . Alcohol use No    Review of Systems  Constitutional: Negative for fever. Cardiovascular: Negative for chest pain. Respiratory: Negative for shortness of breath. Gastrointestinal: Negative for abdominal pain, vomiting and diarrhea. Genitourinary: Negative for dysuria. Musculoskeletal: Negative for back pain. Positive for left shoulder pain. Skin: Negative for rash. Neurological: Negative for headaches, focal weakness or numbness.  10-point ROS otherwise negative.  ____________________________________________   PHYSICAL EXAM:  VITAL SIGNS: ED Triage Vitals  Enc Vitals Group     BP 04/08/16 1613 135/85     Pulse Rate 04/08/16 1613 (!) 103     Resp 04/08/16 1613 (!) 22     Temp 04/08/16 1613 97.8 F (36.6 C)     Temp Source 04/08/16 1613 Oral  SpO2 04/08/16 1613 99 %     Weight 04/08/16 1613 130 lb (59 kg)     Height 04/08/16 1613 4' 11.5" (1.511 m)     Head Circumference --      Peak Flow --      Pain Score 04/08/16 1647 10   Constitutional: Alert and oriented. Well appearing and in no distress. Eyes: Conjunctivae are normal. Normal extraocular movements. ENT   Head: Normocephalic and atraumatic.   Nose: No congestion/rhinnorhea.   Mouth/Throat: Mucous membranes are moist.   Neck: No stridor. Hematological/Lymphatic/Immunilogical: No cervical lymphadenopathy. Cardiovascular: Normal rate, regular rhythm.  No murmurs, rubs, or gallops. Respiratory: Normal respiratory effort without tachypnea nor retractions. Breath sounds are clear and equal bilaterally. No wheezes/rales/rhonchi. Gastrointestinal: Soft and nontender. No distention.  Genitourinary:  Deferred Musculoskeletal: Bruising to left upper arm, tender to palpation of the left shoulder. Neurologic:  Normal speech and language. No gross focal neurologic deficits are appreciated.  Skin:  Skin is warm, dry and intact. No rash noted. Psychiatric: Mood and affect are normal. Speech and behavior are normal. Patient exhibits appropriate insight and judgment.  ____________________________________________    LABS (pertinent positives/negatives)  None  ____________________________________________   EKG  None  ____________________________________________    RADIOLOGY  None  ____________________________________________   PROCEDURES  Procedures  ____________________________________________   INITIAL IMPRESSION / ASSESSMENT AND PLAN / ED COURSE  Pertinent labs & imaging results that were available during my care of the patient were reviewed by me and considered in my medical decision making (see chart for details).  Patient sent from PCP because of left shoulder pain and patient report of left scapula fracture. However, did discuss with Dr. Ernest Pine with orthopedics who was able to review film. Patient with humeral neck fracture. Will place in sling. Can follow up with orthopedics in clinic. Will give pain medication. ____________________________________________   FINAL CLINICAL IMPRESSION(S) / ED DIAGNOSES  Final diagnoses:  Fx humeral neck, left, closed, initial encounter     Note: This dictation was prepared with Dragon dictation. Any transcriptional errors that result from this process are unintentional    Phineas Semen, MD 04/08/16 1909

## 2016-04-10 ENCOUNTER — Ambulatory Visit
Admission: RE | Admit: 2016-04-10 | Discharge: 2016-04-10 | Disposition: A | Discharge status: Home / Self Care | Payer: Medicare Other | Source: Ambulatory Visit | Attending: Orthopedic Surgery | Admitting: Orthopedic Surgery

## 2016-04-10 ENCOUNTER — Other Ambulatory Visit: Payer: Self-pay | Admitting: Orthopedic Surgery

## 2016-04-10 DIAGNOSIS — M2508 Hemarthrosis, other specified site: Secondary | ICD-10-CM | POA: Insufficient documentation

## 2016-04-10 DIAGNOSIS — S42232A 3-part fracture of surgical neck of left humerus, initial encounter for closed fracture: Secondary | ICD-10-CM

## 2016-04-10 DIAGNOSIS — X58XXXA Exposure to other specified factors, initial encounter: Secondary | ICD-10-CM | POA: Diagnosis not present

## 2016-04-10 DIAGNOSIS — R600 Localized edema: Secondary | ICD-10-CM | POA: Diagnosis not present

## 2016-04-11 ENCOUNTER — Encounter
Admission: RE | Admit: 2016-04-11 | Discharge: 2016-04-11 | Disposition: A | Discharge status: Home / Self Care | Payer: Medicare Other | Source: Ambulatory Visit | Attending: Internal Medicine | Admitting: Internal Medicine

## 2016-04-11 DIAGNOSIS — E119 Type 2 diabetes mellitus without complications: Secondary | ICD-10-CM | POA: Diagnosis present

## 2016-04-11 LAB — GLUCOSE, CAPILLARY: GLUCOSE-CAPILLARY: 181 mg/dL — AB (ref 65–99)

## 2016-04-13 DIAGNOSIS — E119 Type 2 diabetes mellitus without complications: Secondary | ICD-10-CM | POA: Diagnosis not present

## 2016-04-13 LAB — GLUCOSE, CAPILLARY
GLUCOSE-CAPILLARY: 180 mg/dL — AB (ref 65–99)
Glucose-Capillary: 200 mg/dL — ABNORMAL HIGH (ref 65–99)

## 2016-04-15 ENCOUNTER — Ambulatory Visit: Admission: RE | Admit: 2016-04-15 | Payer: Medicare Other | Source: Ambulatory Visit

## 2016-04-15 ENCOUNTER — Encounter
Admission: RE | Admit: 2016-04-15 | Discharge: 2016-04-15 | Disposition: A | Discharge status: Home / Self Care | Payer: Medicare Other | Source: Ambulatory Visit | Attending: Surgery | Admitting: Surgery

## 2016-04-15 ENCOUNTER — Ambulatory Visit
Admission: RE | Admit: 2016-04-15 | Discharge: 2016-04-15 | Disposition: A | Discharge status: Home / Self Care | Payer: Medicare Other | Source: Ambulatory Visit | Attending: Surgery | Admitting: Surgery

## 2016-04-15 DIAGNOSIS — E119 Type 2 diabetes mellitus without complications: Secondary | ICD-10-CM

## 2016-04-15 DIAGNOSIS — Z01810 Encounter for preprocedural cardiovascular examination: Secondary | ICD-10-CM | POA: Insufficient documentation

## 2016-04-15 DIAGNOSIS — I10 Essential (primary) hypertension: Secondary | ICD-10-CM

## 2016-04-15 DIAGNOSIS — X58XXXA Exposure to other specified factors, initial encounter: Secondary | ICD-10-CM | POA: Insufficient documentation

## 2016-04-15 DIAGNOSIS — Z01812 Encounter for preprocedural laboratory examination: Secondary | ICD-10-CM | POA: Insufficient documentation

## 2016-04-15 DIAGNOSIS — S42242A 4-part fracture of surgical neck of left humerus, initial encounter for closed fracture: Secondary | ICD-10-CM | POA: Insufficient documentation

## 2016-04-15 HISTORY — DX: Unspecified osteoarthritis, unspecified site: M19.90

## 2016-04-15 HISTORY — DX: Restless legs syndrome: G25.81

## 2016-04-15 LAB — TYPE AND SCREEN
ABO/RH(D): B POS
ANTIBODY SCREEN: NEGATIVE

## 2016-04-15 LAB — BASIC METABOLIC PANEL
ANION GAP: 10 (ref 5–15)
BUN: 14 mg/dL (ref 6–20)
CALCIUM: 9 mg/dL (ref 8.9–10.3)
CO2: 27 mmol/L (ref 22–32)
Chloride: 99 mmol/L — ABNORMAL LOW (ref 101–111)
Creatinine, Ser: 0.59 mg/dL (ref 0.44–1.00)
Glucose, Bld: 210 mg/dL — ABNORMAL HIGH (ref 65–99)
POTASSIUM: 3.1 mmol/L — AB (ref 3.5–5.1)
Sodium: 136 mmol/L (ref 135–145)

## 2016-04-15 LAB — URINALYSIS COMPLETE WITH MICROSCOPIC (ARMC ONLY)
Bacteria, UA: NONE SEEN
Bilirubin Urine: NEGATIVE
Glucose, UA: 50 mg/dL — AB
HGB URINE DIPSTICK: NEGATIVE
Ketones, ur: NEGATIVE mg/dL
Nitrite: NEGATIVE
PROTEIN: 30 mg/dL — AB
SPECIFIC GRAVITY, URINE: 1.023 (ref 1.005–1.030)
Trans Epithel, UA: 4
pH: 6 (ref 5.0–8.0)

## 2016-04-15 LAB — CBC
HEMATOCRIT: 34.2 % — AB (ref 35.0–47.0)
Hemoglobin: 11.7 g/dL — ABNORMAL LOW (ref 12.0–16.0)
MCH: 30.1 pg (ref 26.0–34.0)
MCHC: 34.3 g/dL (ref 32.0–36.0)
MCV: 87.9 fL (ref 80.0–100.0)
Platelets: 418 10*3/uL (ref 150–440)
RBC: 3.89 MIL/uL (ref 3.80–5.20)
RDW: 14 % (ref 11.5–14.5)
WBC: 10.6 10*3/uL (ref 3.6–11.0)

## 2016-04-15 LAB — GLUCOSE, CAPILLARY
GLUCOSE-CAPILLARY: 132 mg/dL — AB (ref 65–99)
Glucose-Capillary: 159 mg/dL — ABNORMAL HIGH (ref 65–99)

## 2016-04-15 LAB — SURGICAL PCR SCREEN
MRSA, PCR: POSITIVE — AB
STAPHYLOCOCCUS AUREUS: POSITIVE — AB

## 2016-04-15 LAB — PROTIME-INR
INR: 0.92
Prothrombin Time: 12.3 seconds (ref 11.4–15.2)

## 2016-04-15 MED ORDER — CEFAZOLIN SODIUM-DEXTROSE 2-4 GM/100ML-% IV SOLN: 2.0000 g | Freq: Once | INTRAVENOUS | Status: DC

## 2016-04-15 NOTE — Pre-Procedure Instructions (Signed)
Potassium results faxed and called to Dr, Joice LoftsPoggi office.

## 2016-04-15 NOTE — Patient Instructions (Signed)
  Your procedure is scheduled on:April 16, 2016 (Tuesday) [facility] to [facility]nd [facility] To find out your arrival time please call [phone] between 1PM - 3PM on April 15, 2016 (Today)  Remember: Instructions that are not followed completely may result in serious medical risk, up to and including death, or upon the discretion of your surgeon and anesthesiologist your surgery may need to be rescheduled.    _x___ 1. Do not eat food or drink liquids after midnight. No gum chewing or hard candies.     __x__ 2. No Alcohol for 24 hours before or after surgery.   __x__3. No Smoking for 24 prior to surgery.   ____  4. Bring all medications with you on the day of surgery if instructed.    __x__ 5. Notify your doctor if there is any change in your medical condition     (cold, fever, infections).     Do not wear jewelry, make-up, hairpins, clips or nail polish.  Do not wear lotions, powders, or perfumes. You may wear deodorant.  Do not shave 48 hours prior to surgery. Men may shave face and neck.  Do not bring valuables to the hospital.    [facility] is not responsible for any belongings or valuables.               Contacts, dentures or bridgework may not be worn into surgery.  Leave your suitcase in the car. After surgery it may be brought to your room.  For patients admitted to the hospital, discharge time is determined by your treatment team.   Patients discharged the day of surgery will not be allowed to drive home.    Please read over the following fact sheets that you were given:   [facility] Preparing for Surgery and or MRSA Information   _x___ Take these medicines the morning of surgery with A SIP OF WATER:    1. Amlodipine  2. Lisinopril  3.  4.  5.  6.  ____Fleets enema or Magnesium Citrate as directed.   _x___ Use CHG Soap or sage wipes as directed on instruction sheet   ____ Use inhalers on the day of surgery and bring to hospital day of  surgery  _x___ Stop metformin 2 days prior to surgery (STOP METFORMIN NOW)    ____ Take 1/2 of usual insulin dose the night before surgery and none on the morning of           surgery.   _x__ Stop aspirin or coumadin, or plavix (STOP ASPIRIN NOW)  x__ Stop Anti-inflammatories such as Advil, Aleve, Ibuprofen, Motrin, Naproxen,          Naprosyn, Goodies powders or aspirin products. Ok to take Tylenol. (STOP EXCEDRIN NOW)   ____ Stop supplements until after surgery.    ____ Bring C-Pap to the hospital.

## 2016-04-16 ENCOUNTER — Inpatient Hospital Stay: Payer: Medicare Other

## 2016-04-16 ENCOUNTER — Inpatient Hospital Stay: Payer: Medicare Other | Admitting: Anesthesiology

## 2016-04-16 ENCOUNTER — Encounter
Admission: RE | Disposition: A | Discharge status: Skilled Nursing Facility | Payer: Self-pay | Source: Ambulatory Visit | Attending: Surgery

## 2016-04-16 ENCOUNTER — Inpatient Hospital Stay
Admission: RE | Admit: 2016-04-16 | Discharge: 2016-04-17 | DRG: 483 | Disposition: A | Discharge status: Skilled Nursing Facility | Payer: Medicare Other | Source: Ambulatory Visit | Attending: Surgery | Admitting: Surgery

## 2016-04-16 ENCOUNTER — Encounter: Payer: Self-pay | Admitting: *Deleted

## 2016-04-16 DIAGNOSIS — F419 Anxiety disorder, unspecified: Secondary | ICD-10-CM | POA: Diagnosis present

## 2016-04-16 DIAGNOSIS — Z7984 Long term (current) use of oral hypoglycemic drugs: Secondary | ICD-10-CM

## 2016-04-16 DIAGNOSIS — G2581 Restless legs syndrome: Secondary | ICD-10-CM | POA: Diagnosis present

## 2016-04-16 DIAGNOSIS — M810 Age-related osteoporosis without current pathological fracture: Secondary | ICD-10-CM | POA: Diagnosis present

## 2016-04-16 DIAGNOSIS — S42212A Unspecified displaced fracture of surgical neck of left humerus, initial encounter for closed fracture: Principal | ICD-10-CM | POA: Diagnosis present

## 2016-04-16 DIAGNOSIS — E119 Type 2 diabetes mellitus without complications: Secondary | ICD-10-CM | POA: Diagnosis present

## 2016-04-16 DIAGNOSIS — Z981 Arthrodesis status: Secondary | ICD-10-CM

## 2016-04-16 DIAGNOSIS — Z888 Allergy status to other drugs, medicaments and biological substances status: Secondary | ICD-10-CM

## 2016-04-16 DIAGNOSIS — R2681 Unsteadiness on feet: Secondary | ICD-10-CM

## 2016-04-16 DIAGNOSIS — Z833 Family history of diabetes mellitus: Secondary | ICD-10-CM | POA: Diagnosis not present

## 2016-04-16 DIAGNOSIS — Z22322 Carrier or suspected carrier of Methicillin resistant Staphylococcus aureus: Secondary | ICD-10-CM | POA: Diagnosis not present

## 2016-04-16 DIAGNOSIS — Z8249 Family history of ischemic heart disease and other diseases of the circulatory system: Secondary | ICD-10-CM

## 2016-04-16 DIAGNOSIS — W19XXXA Unspecified fall, initial encounter: Secondary | ICD-10-CM | POA: Diagnosis present

## 2016-04-16 DIAGNOSIS — Z7982 Long term (current) use of aspirin: Secondary | ICD-10-CM

## 2016-04-16 DIAGNOSIS — Z79899 Other long term (current) drug therapy: Secondary | ICD-10-CM | POA: Diagnosis not present

## 2016-04-16 DIAGNOSIS — E785 Hyperlipidemia, unspecified: Secondary | ICD-10-CM | POA: Diagnosis present

## 2016-04-16 DIAGNOSIS — Z8673 Personal history of transient ischemic attack (TIA), and cerebral infarction without residual deficits: Secondary | ICD-10-CM | POA: Diagnosis not present

## 2016-04-16 DIAGNOSIS — I10 Essential (primary) hypertension: Secondary | ICD-10-CM | POA: Diagnosis present

## 2016-04-16 DIAGNOSIS — Z96612 Presence of left artificial shoulder joint: Secondary | ICD-10-CM

## 2016-04-16 HISTORY — PX: REVERSE SHOULDER ARTHROPLASTY: SHX5054

## 2016-04-16 HISTORY — DX: Headache: R51

## 2016-04-16 HISTORY — DX: Headache, unspecified: R51.9

## 2016-04-16 HISTORY — DX: Cerebral infarction, unspecified: I63.9

## 2016-04-16 HISTORY — DX: Anxiety disorder, unspecified: F41.9

## 2016-04-16 LAB — GLUCOSE, CAPILLARY
GLUCOSE-CAPILLARY: 183 mg/dL — AB (ref 65–99)
Glucose-Capillary: 173 mg/dL — ABNORMAL HIGH (ref 65–99)
Glucose-Capillary: 310 mg/dL — ABNORMAL HIGH (ref 65–99)
Glucose-Capillary: 232 mg/dL — ABNORMAL HIGH (ref 65–99)

## 2016-04-16 LAB — POCT I-STAT 4, (NA,K, GLUC, HGB,HCT)
GLUCOSE: 198 mg/dL — AB (ref 65–99)
HCT: 35 % — ABNORMAL LOW (ref 36.0–46.0)
Hemoglobin: 11.9 g/dL — ABNORMAL LOW (ref 12.0–15.0)
POTASSIUM: 3.8 mmol/L (ref 3.5–5.1)
SODIUM: 139 mmol/L (ref 135–145)

## 2016-04-16 SURGERY — ARTHROPLASTY, SHOULDER, TOTAL, REVERSE
Anesthesia: Regional | Site: Shoulder | Laterality: Left | Wound class: Clean

## 2016-04-16 MED ORDER — ROCURONIUM BROMIDE 100 MG/10ML IV SOLN
INTRAVENOUS | Status: DC | PRN
  Administered 2016-04-16: 30 mg via INTRAVENOUS
  Administered 2016-04-16: 10 mg via INTRAVENOUS
  Administered 2016-04-16: 40 mg via INTRAVENOUS

## 2016-04-16 MED ORDER — VANCOMYCIN HCL IN DEXTROSE 1-5 GM/200ML-% IV SOLN
INTRAVENOUS | Status: AC
  Administered 2016-04-16: 1000 mg via INTRAVENOUS
  Filled 2016-04-16: qty 200

## 2016-04-16 MED ORDER — FENTANYL CITRATE (PF) 100 MCG/2ML IJ SOLN: 25.0000 ug | INTRAMUSCULAR | Status: DC | PRN

## 2016-04-16 MED ORDER — METOCLOPRAMIDE HCL 5 MG/ML IJ SOLN: 5.0000 mg | Freq: Three times a day (TID) | INTRAMUSCULAR | Status: DC | PRN

## 2016-04-16 MED ORDER — ROPINIROLE HCL 0.25 MG PO TABS
0.2500 mg | ORAL_TABLET | Freq: Every day | ORAL | Status: DC
  Administered 2016-04-16: 0.25 mg via ORAL
  Filled 2016-04-16: qty 1

## 2016-04-16 MED ORDER — OXYCODONE HCL 5 MG PO TABS
5.0000 mg | ORAL_TABLET | ORAL | Status: DC | PRN
  Administered 2016-04-16 – 2016-04-17 (×4): 5 mg via ORAL
  Filled 2016-04-16 (×4): qty 1

## 2016-04-16 MED ORDER — ONDANSETRON HCL 4 MG PO TABS: 4.0000 mg | ORAL_TABLET | Freq: Four times a day (QID) | ORAL | Status: DC | PRN

## 2016-04-16 MED ORDER — SUGAMMADEX SODIUM 200 MG/2ML IV SOLN
INTRAVENOUS | Status: DC | PRN
  Administered 2016-04-16: 100 mg via INTRAVENOUS

## 2016-04-16 MED ORDER — PROPOFOL 10 MG/ML IV BOLUS
INTRAVENOUS | Status: DC | PRN
  Administered 2016-04-16: 40 mg via INTRAVENOUS
  Administered 2016-04-16: 120 mg via INTRAVENOUS
  Administered 2016-04-16: 40 mg via INTRAVENOUS

## 2016-04-16 MED ORDER — PHENYLEPHRINE HCL 10 MG/ML IJ SOLN
INTRAMUSCULAR | Status: AC
  Filled 2016-04-16: qty 1

## 2016-04-16 MED ORDER — ONDANSETRON HCL 4 MG/2ML IJ SOLN: 4.0000 mg | Freq: Once | INTRAMUSCULAR | Status: DC | PRN

## 2016-04-16 MED ORDER — HYDROCHLOROTHIAZIDE 12.5 MG PO CAPS
12.5000 mg | ORAL_CAPSULE | Freq: Every day | ORAL | Status: DC
  Administered 2016-04-16 – 2016-04-17 (×2): 12.5 mg via ORAL
  Filled 2016-04-16 (×2): qty 1

## 2016-04-16 MED ORDER — PANTOPRAZOLE SODIUM 40 MG PO TBEC
40.0000 mg | DELAYED_RELEASE_TABLET | Freq: Every day | ORAL | Status: DC
  Administered 2016-04-17: 40 mg via ORAL
  Filled 2016-04-16: qty 1

## 2016-04-16 MED ORDER — ACETAMINOPHEN 650 MG RE SUPP: 650.0000 mg | Freq: Four times a day (QID) | RECTAL | Status: DC | PRN

## 2016-04-16 MED ORDER — BUPIVACAINE-EPINEPHRINE (PF) 0.5% -1:200000 IJ SOLN
INTRAMUSCULAR | Status: DC | PRN
  Administered 2016-04-16: 30 mL via PERINEURAL

## 2016-04-16 MED ORDER — FAMOTIDINE 20 MG PO TABS
ORAL_TABLET | ORAL | Status: AC
  Administered 2016-04-16: 20 mg via ORAL
  Filled 2016-04-16: qty 1

## 2016-04-16 MED ORDER — ENOXAPARIN SODIUM 40 MG/0.4ML ~~LOC~~ SOLN
40.0000 mg | SUBCUTANEOUS | Status: DC
  Administered 2016-04-17: 40 mg via SUBCUTANEOUS
  Filled 2016-04-16: qty 0.4

## 2016-04-16 MED ORDER — PHENYLEPHRINE HCL 10 MG/ML IJ SOLN
INTRAVENOUS | Status: DC | PRN
  Administered 2016-04-16: 30 ug/min via INTRAVENOUS

## 2016-04-16 MED ORDER — FLEET ENEMA 7-19 GM/118ML RE ENEM: 1.0000 | ENEMA | Freq: Once | RECTAL | Status: DC | PRN

## 2016-04-16 MED ORDER — ACETAMINOPHEN 325 MG PO TABS
650.0000 mg | ORAL_TABLET | Freq: Four times a day (QID) | ORAL | Status: DC | PRN
  Filled 2016-04-16: qty 2

## 2016-04-16 MED ORDER — MOMETASONE FUROATE 0.1 % EX CREA
1.0000 "application " | TOPICAL_CREAM | Freq: Every day | CUTANEOUS | Status: DC | PRN
  Filled 2016-04-16: qty 15

## 2016-04-16 MED ORDER — AMLODIPINE BESYLATE 10 MG PO TABS
10.0000 mg | ORAL_TABLET | Freq: Every day | ORAL | Status: DC
  Administered 2016-04-17: 10 mg via ORAL
  Filled 2016-04-16: qty 1

## 2016-04-16 MED ORDER — GLIMEPIRIDE 2 MG PO TABS
4.0000 mg | ORAL_TABLET | Freq: Every day | ORAL | Status: DC
  Administered 2016-04-17: 4 mg via ORAL
  Filled 2016-04-16: qty 2

## 2016-04-16 MED ORDER — BUPIVACAINE LIPOSOME 1.3 % IJ SUSP
INTRAMUSCULAR | Status: DC | PRN
  Administered 2016-04-16: 60 mL

## 2016-04-16 MED ORDER — VANCOMYCIN HCL IN DEXTROSE 1-5 GM/200ML-% IV SOLN
1000.0000 mg | Freq: Two times a day (BID) | INTRAVENOUS | Status: AC
  Administered 2016-04-16: 1000 mg via INTRAVENOUS
  Filled 2016-04-16: qty 200

## 2016-04-16 MED ORDER — TRANEXAMIC ACID 1000 MG/10ML IV SOLN
INTRAVENOUS | Status: AC
  Filled 2016-04-16: qty 10

## 2016-04-16 MED ORDER — HYDROMORPHONE HCL 1 MG/ML IJ SOLN
INTRAMUSCULAR | Status: DC | PRN
  Administered 2016-04-16: 1 mg via INTRAVENOUS

## 2016-04-16 MED ORDER — BUPIVACAINE-EPINEPHRINE (PF) 0.5% -1:200000 IJ SOLN
INTRAMUSCULAR | Status: AC
Start: 2016-04-16 — End: 2016-04-16
  Filled 2016-04-16: qty 30

## 2016-04-16 MED ORDER — DIPHENHYDRAMINE HCL 25 MG PO CAPS: 25.0000 mg | ORAL_CAPSULE | Freq: Four times a day (QID) | ORAL | Status: DC | PRN

## 2016-04-16 MED ORDER — SODIUM CHLORIDE 0.9 % IJ SOLN
INTRAMUSCULAR | Status: AC
Start: 2016-04-16 — End: 2016-04-16
  Filled 2016-04-16: qty 50

## 2016-04-16 MED ORDER — KETOROLAC TROMETHAMINE 15 MG/ML IJ SOLN
7.5000 mg | Freq: Four times a day (QID) | INTRAMUSCULAR | Status: AC
  Administered 2016-04-16: 7.5 mg via INTRAVENOUS
  Filled 2016-04-16 (×3): qty 1

## 2016-04-16 MED ORDER — MAGNESIUM HYDROXIDE 400 MG/5ML PO SUSP: 30.0000 mL | Freq: Every day | ORAL | Status: DC | PRN

## 2016-04-16 MED ORDER — DEXAMETHASONE SODIUM PHOSPHATE 4 MG/ML IJ SOLN
INTRAMUSCULAR | Status: DC | PRN
  Administered 2016-04-16: 5 mg via INTRAVENOUS

## 2016-04-16 MED ORDER — VANCOMYCIN HCL IN DEXTROSE 1-5 GM/200ML-% IV SOLN
1000.0000 mg | Freq: Once | INTRAVENOUS | Status: AC
  Administered 2016-04-16: 1000 mg via INTRAVENOUS

## 2016-04-16 MED ORDER — BUPIVACAINE LIPOSOME 1.3 % IJ SUSP
INTRAMUSCULAR | Status: AC
  Filled 2016-04-16: qty 20

## 2016-04-16 MED ORDER — TRANEXAMIC ACID 1000 MG/10ML IV SOLN
INTRAVENOUS | Status: DC | PRN
  Administered 2016-04-16: 1000 mg via INTRAVENOUS

## 2016-04-16 MED ORDER — DIPHENHYDRAMINE HCL 12.5 MG/5ML PO ELIX
12.5000 mg | ORAL_SOLUTION | ORAL | Status: DC | PRN
  Administered 2016-04-16: 12.5 mg via ORAL
  Filled 2016-04-16: qty 10

## 2016-04-16 MED ORDER — LISINOPRIL 20 MG PO TABS
40.0000 mg | ORAL_TABLET | Freq: Two times a day (BID) | ORAL | Status: DC
  Administered 2016-04-17: 40 mg via ORAL
  Filled 2016-04-16: qty 2

## 2016-04-16 MED ORDER — SODIUM CHLORIDE 0.9 % IV SOLN
INTRAVENOUS | Status: DC
  Administered 2016-04-16: 09:00:00 via INTRAVENOUS
  Administered 2016-04-16: 50 mL/h via INTRAVENOUS

## 2016-04-16 MED ORDER — ACETAMINOPHEN 10 MG/ML IV SOLN
INTRAVENOUS | Status: AC
  Filled 2016-04-16: qty 100

## 2016-04-16 MED ORDER — DOCUSATE SODIUM 100 MG PO CAPS
100.0000 mg | ORAL_CAPSULE | Freq: Two times a day (BID) | ORAL | Status: DC
  Administered 2016-04-16 – 2016-04-17 (×2): 100 mg via ORAL
  Filled 2016-04-16 (×2): qty 1

## 2016-04-16 MED ORDER — ATORVASTATIN CALCIUM 20 MG PO TABS
80.0000 mg | ORAL_TABLET | Freq: Every day | ORAL | Status: DC
  Administered 2016-04-16: 80 mg via ORAL
  Filled 2016-04-16: qty 4

## 2016-04-16 MED ORDER — FAMOTIDINE 20 MG PO TABS
20.0000 mg | ORAL_TABLET | Freq: Once | ORAL | Status: AC
  Administered 2016-04-16: 20 mg via ORAL

## 2016-04-16 MED ORDER — ACETAMINOPHEN 500 MG PO TABS
1000.0000 mg | ORAL_TABLET | Freq: Four times a day (QID) | ORAL | Status: AC
  Administered 2016-04-16 – 2016-04-17 (×3): 1000 mg via ORAL
  Filled 2016-04-16 (×2): qty 2

## 2016-04-16 MED ORDER — METFORMIN HCL 500 MG PO TABS
1000.0000 mg | ORAL_TABLET | Freq: Two times a day (BID) | ORAL | Status: DC
  Administered 2016-04-16 – 2016-04-17 (×2): 1000 mg via ORAL
  Filled 2016-04-16 (×2): qty 2

## 2016-04-16 MED ORDER — ASPIRIN-ACETAMINOPHEN-CAFFEINE 250-250-65 MG PO TABS: 2.0000 | ORAL_TABLET | Freq: Three times a day (TID) | ORAL | Status: DC | PRN

## 2016-04-16 MED ORDER — LACTULOSE 10 GM/15ML PO SOLN
20.0000 g | Freq: Two times a day (BID) | ORAL | Status: DC | PRN
  Administered 2016-04-17: 20 g via ORAL
  Filled 2016-04-16: qty 30

## 2016-04-16 MED ORDER — CALCIUM CARBONATE-VITAMIN D 500-200 MG-UNIT PO TABS
1.0000 | ORAL_TABLET | Freq: Two times a day (BID) | ORAL | Status: DC
  Administered 2016-04-16 – 2016-04-17 (×2): 1 via ORAL
  Filled 2016-04-16 (×2): qty 1

## 2016-04-16 MED ORDER — PHENYLEPHRINE HCL 10 MG/ML IJ SOLN
INTRAMUSCULAR | Status: DC | PRN
  Administered 2016-04-16 (×4): 100 ug via INTRAVENOUS

## 2016-04-16 MED ORDER — BISACODYL 10 MG RE SUPP: 10.0000 mg | Freq: Every day | RECTAL | Status: DC | PRN

## 2016-04-16 MED ORDER — ONDANSETRON HCL 4 MG/2ML IJ SOLN
INTRAMUSCULAR | Status: DC | PRN
  Administered 2016-04-16: 4 mg via INTRAVENOUS

## 2016-04-16 MED ORDER — ACETAMINOPHEN 10 MG/ML IV SOLN
INTRAVENOUS | Status: DC | PRN
  Administered 2016-04-16: 1000 mg via INTRAVENOUS

## 2016-04-16 MED ORDER — HYDROMORPHONE HCL 1 MG/ML IJ SOLN: 1.0000 mg | INTRAMUSCULAR | Status: DC | PRN

## 2016-04-16 MED ORDER — LINAGLIPTIN 5 MG PO TABS: 5.0000 mg | ORAL_TABLET | Freq: Every day | ORAL | Status: DC

## 2016-04-16 MED ORDER — ONDANSETRON HCL 4 MG/2ML IJ SOLN: 4.0000 mg | Freq: Four times a day (QID) | INTRAMUSCULAR | Status: DC | PRN

## 2016-04-16 MED ORDER — ROPIVACAINE HCL 5 MG/ML IJ SOLN
INTRAMUSCULAR | Status: AC
  Filled 2016-04-16: qty 40

## 2016-04-16 MED ORDER — FENTANYL CITRATE (PF) 100 MCG/2ML IJ SOLN
INTRAMUSCULAR | Status: DC | PRN
  Administered 2016-04-16: 50 ug via INTRAVENOUS
  Administered 2016-04-16: 100 ug via INTRAVENOUS
  Administered 2016-04-16: 50 ug via INTRAVENOUS

## 2016-04-16 MED ORDER — INSULIN ASPART 100 UNIT/ML ~~LOC~~ SOLN
0.0000 [IU] | Freq: Three times a day (TID) | SUBCUTANEOUS | Status: DC
  Administered 2016-04-16: 11 [IU] via SUBCUTANEOUS
  Administered 2016-04-17: 3 [IU] via SUBCUTANEOUS
  Filled 2016-04-16: qty 3
  Filled 2016-04-16: qty 11

## 2016-04-16 MED ORDER — MIDAZOLAM HCL 2 MG/2ML IJ SOLN
INTRAMUSCULAR | Status: DC | PRN
  Administered 2016-04-16: 0.5 mg via INTRAVENOUS

## 2016-04-16 MED ORDER — ASPIRIN EC 81 MG PO TBEC
81.0000 mg | DELAYED_RELEASE_TABLET | Freq: Every day | ORAL | Status: DC
  Administered 2016-04-17: 81 mg via ORAL
  Filled 2016-04-16: qty 1

## 2016-04-16 MED ORDER — NEOMYCIN-POLYMYXIN B GU 40-200000 IR SOLN
Status: AC
  Filled 2016-04-16: qty 20

## 2016-04-16 MED ORDER — POTASSIUM CHLORIDE IN NACL 20-0.9 MEQ/L-% IV SOLN
INTRAVENOUS | Status: DC
  Administered 2016-04-16: 13:00:00 via INTRAVENOUS
  Filled 2016-04-16 (×3): qty 1000

## 2016-04-16 MED ORDER — LIDOCAINE HCL (CARDIAC) 20 MG/ML IV SOLN
INTRAVENOUS | Status: DC | PRN
  Administered 2016-04-16: 60 mg via INTRAVENOUS

## 2016-04-16 MED ORDER — NEOMYCIN-POLYMYXIN B GU 40-200000 IR SOLN
Status: DC | PRN
  Administered 2016-04-16: 16 mL

## 2016-04-16 MED ORDER — KETOROLAC TROMETHAMINE 15 MG/ML IJ SOLN
15.0000 mg | Freq: Once | INTRAMUSCULAR | Status: AC
  Administered 2016-04-16: 15 mg via INTRAVENOUS

## 2016-04-16 MED ORDER — METOCLOPRAMIDE HCL 10 MG PO TABS: 5.0000 mg | ORAL_TABLET | Freq: Three times a day (TID) | ORAL | Status: DC | PRN

## 2016-04-16 SURGICAL SUPPLY — 67 items
BAG DECANTER FOR FLEXI CONT (MISCELLANEOUS) ×3 IMPLANT
BIT DRILL TWIST 2.7 (BIT) ×2 IMPLANT
BIT DRILL TWIST 2.7MM (BIT) ×1
BLADE SAGITTAL WIDE XTHICK NO (BLADE) ×3 IMPLANT
BONE CEMENT PALACOSE (Orthopedic Implant) ×6 IMPLANT
CANISTER SUCT 1200ML W/VALVE (MISCELLANEOUS) ×3 IMPLANT
CANISTER SUCT 3000ML PPV (MISCELLANEOUS) ×6 IMPLANT
CAPT SHLDR REVTOTAL 2 ×3 IMPLANT
CATH TRAY METER 16FR LF (MISCELLANEOUS) ×3 IMPLANT
CEMENT BONE PALACOSE (Orthopedic Implant) ×2 IMPLANT
CHLORAPREP W/TINT 26ML (MISCELLANEOUS) ×6 IMPLANT
COOLER POLAR GLACIER W/PUMP (MISCELLANEOUS) ×3 IMPLANT
CRADLE LAMINECT ARM (MISCELLANEOUS) ×3 IMPLANT
DECANTER SPIKE VIAL GLASS SM (MISCELLANEOUS) ×9 IMPLANT
DRAPE IMP U-DRAPE 54X76 (DRAPES) ×6 IMPLANT
DRAPE INCISE IOBAN 66X45 STRL (DRAPES) ×6 IMPLANT
DRAPE INCISE IOBAN 66X60 STRL (DRAPES) ×3 IMPLANT
DRAPE SHEET LG 3/4 BI-LAMINATE (DRAPES) ×6 IMPLANT
DRAPE TABLE BACK 80X90 (DRAPES) ×3 IMPLANT
DRSG OPSITE POSTOP 4X8 (GAUZE/BANDAGES/DRESSINGS) ×3 IMPLANT
ELECT CAUTERY BLADE 6.4 (BLADE) ×3 IMPLANT
GAUZE PACK 2X3YD (MISCELLANEOUS) ×3 IMPLANT
GLOVE BIO SURGEON STRL SZ7.5 (GLOVE) ×6 IMPLANT
GLOVE BIO SURGEON STRL SZ8 (GLOVE) ×6 IMPLANT
GLOVE BIOGEL PI IND STRL 8 (GLOVE) ×5 IMPLANT
GLOVE BIOGEL PI INDICATOR 8 (GLOVE) ×10
GLOVE INDICATOR 8.0 STRL GRN (GLOVE) ×3 IMPLANT
GOWN STRL REUS W/ TWL LRG LVL3 (GOWN DISPOSABLE) ×2 IMPLANT
GOWN STRL REUS W/ TWL XL LVL3 (GOWN DISPOSABLE) ×1 IMPLANT
GOWN STRL REUS W/TWL LRG LVL3 (GOWN DISPOSABLE) ×4
GOWN STRL REUS W/TWL XL LVL3 (GOWN DISPOSABLE) ×2
HANDPIECE INTERPULSE COAX TIP (DISPOSABLE) ×2
HOOD PEEL AWAY FLYTE STAYCOOL (MISCELLANEOUS) ×12 IMPLANT
KIT RM TURNOVER STRD PROC AR (KITS) ×3 IMPLANT
KIT STABILIZATION SHOULDER (MISCELLANEOUS) ×3 IMPLANT
MASK FACE SPIDER DISP (MASK) ×3 IMPLANT
NDL MAYO CATGUT SZ1 (NEEDLE) ×3
NDL MAYO CATGUT SZ5 (NEEDLE) ×2
NDL SAFETY 22GX1.5 (NEEDLE) ×3 IMPLANT
NDL SUT 5 .5 CRC TPR PNT MAYO (NEEDLE) ×1 IMPLANT
NEEDLE 18GX1X1/2 (RX/OR ONLY) (NEEDLE) ×3 IMPLANT
NEEDLE HYPO 25X1 1.5 SAFETY (NEEDLE) ×3 IMPLANT
NEEDLE MAYO CATGUT SZ1 (NEEDLE) ×1 IMPLANT
NEEDLE MAYO CATGUT SZ4 (NEEDLE) ×3 IMPLANT
NEEDLE SPNL 20GX3.5 QUINCKE YW (NEEDLE) ×3 IMPLANT
NS IRRIG 1000ML POUR BTL (IV SOLUTION) ×3 IMPLANT
PACK ARTHROSCOPY SHOULDER (MISCELLANEOUS) ×3 IMPLANT
PAD WRAPON POLAR SHDR UNIV (MISCELLANEOUS) ×1 IMPLANT
PIN THREADED REVERSE (PIN) ×3 IMPLANT
SET HNDPC FAN SPRY TIP SCT (DISPOSABLE) ×1 IMPLANT
SLING ULTRA II M (MISCELLANEOUS) ×3 IMPLANT
SOL .9 NS 3000ML IRR  AL (IV SOLUTION) ×2
SOL .9 NS 3000ML IRR UROMATIC (IV SOLUTION) ×1 IMPLANT
SPONGE LAP 18X18 5 PK (GAUZE/BANDAGES/DRESSINGS) IMPLANT
STAPLER SKIN PROX 35W (STAPLE) ×3 IMPLANT
SUT ETHIBOND 0 MO6 C/R (SUTURE) ×3 IMPLANT
SUT ETHIBOND NAB CT1 #1 30IN (SUTURE) ×3 IMPLANT
SUT FIBERWIRE #2 38 BLUE 1/2 (SUTURE) ×3
SUT VIC AB 0 CT1 36 (SUTURE) ×6 IMPLANT
SUT VIC AB 2-0 CT1 27 (SUTURE) ×4
SUT VIC AB 2-0 CT1 TAPERPNT 27 (SUTURE) ×2 IMPLANT
SUTURE FIBERWR #2 38 BLUE 1/2 (SUTURE) ×1 IMPLANT
SYR 30ML LL (SYRINGE) ×6 IMPLANT
SYRINGE 10CC LL (SYRINGE) ×3 IMPLANT
TUBE CONNECTING  6'X3/16 (MISCELLANEOUS) ×1
TUBE CONNECTING 6X3/16 (MISCELLANEOUS) ×2 IMPLANT
WRAPON POLAR PAD SHDR UNIV (MISCELLANEOUS) ×3

## 2016-04-16 NOTE — Anesthesia Preprocedure Evaluation (Signed)
Anesthesia Evaluation  Patient identified by MRN, date of birth, ID band Patient awake    Reviewed: Allergy & Precautions, H&P , NPO status , Patient's Chart, lab work & pertinent test results, reviewed documented beta blocker date and time   History of Anesthesia Complications Negative for: history of anesthetic complications  Airway Mallampati: II  TM Distance: >3 FB Neck ROM: full    Dental no notable dental hx. (+) Teeth Intact   Pulmonary neg pulmonary ROS,           Cardiovascular Exercise Tolerance: Good hypertension, (-) angina(-) CAD, (-) Past MI, (-) Cardiac Stents and (-) CABG (-) dysrhythmias (-) Valvular Problems/Murmurs     Neuro/Psych  Headaches, neg Seizures TIAnegative psych ROS   GI/Hepatic negative GI ROS, Neg liver ROS,   Endo/Other  diabetes, Well Controlled, Oral Hypoglycemic Agents  Renal/GU CRFRenal disease  negative genitourinary   Musculoskeletal   Abdominal   Peds  Hematology  (+) Blood dyscrasia, anemia ,   Anesthesia Other Findings Past Medical History: No date: Anxiety No date: Arthritis No date: Diabetes mellitus without complication (HCC)     Comment: Non Insulin dependant No date: Headache     Comment: h/o migraines No date: Hyperlipemia No date: Hypertension No date: Restless leg syndrome 2002: Stroke [facility](HCC)     Comment: mini stroke No date: TIA (transient ischemic attack)     Comment: approx 15 years ago No date: Vertigo     Comment: hx of   Reproductive/Obstetrics negative OB ROS                             Anesthesia Physical Anesthesia Plan  ASA: II  Anesthesia Plan: General and Regional   Post-op Pain Management: GA combined w/ Regional for post-op pain   Induction:   Airway Management Planned:   Additional Equipment:   Intra-op Plan:   Post-operative Plan:   Informed Consent: I have reviewed the patients History and Physical,  chart, labs and discussed the procedure including the risks, benefits and alternatives for the proposed anesthesia with the patient or authorized representative who has indicated his/her understanding and acceptance.   Dental Advisory Given  Plan Discussed with: Anesthesiologist, CRNA and Surgeon  Anesthesia Plan Comments:         Anesthesia Quick Evaluation

## 2016-04-16 NOTE — Op Note (Signed)
04/16/2016  10:46 AM  Patient:   Cheryl Mueller  Pre-Op Diagnosis:   Closed displaced 3-part left proximal humerus fracture.  Post-Op Diagnosis:   Same  Procedure:   Reverse left total shoulder arthroplasty.  Surgeon:   Maryagnes Amos, MD  Assistant:   Horris Latino, PA-C  Anesthesia:   General endotracheal with an interscalene block placed preoperatively by the anesthesiologist.  Findings:   As above.  Complications:   None  EBL:  150 cc  Fluids:   1400 cc crystalloid  UOP:  700 cc  TT:   None  Drains:   None  Closure:   Staples  Implants:   Biomet Comprehensive system with a #4 cemented mini-humeral stem, a 44 mm humeral tray with a standard insert, and a mini-base plate with a 36 mm glenosphere.  Brief Clinical Note:   The patient is a 75 year old female who sustained the above-noted injury approximately 10 days ago. She has been cleared medically and presents at this time for a reverse left total shoulder arthroplasty.  Procedure:   The patient was brought into the operating room and lain in the supine position on the OR table. After adequate IV sedation was achieved, an interscalene block was placed preoperatively by the anesthesiologist. The patient then underwent general endotracheal intubation and anesthesia before a Foley catheter was inserted and the patient repositioned in the beach chair position using the beach chair positioner. The left shoulder and upper extremity were prepped with ChloraPrep solution before being draped sterilely. Preoperative antibiotics were administered. A standard anterior approach to the shoulder was made through an approximately 4-5 inch incision. The incision was carried down through the subcutaneous tissues to expose the deltopectoral fascia. The interval between the deltoid and pectoralis muscles was identified and this plane developed, retracting the cephalic vein laterally with the deltoid muscle. The conjoined tendon was identified.  Its lateral margin was dissected and the Kolbel self-retraining retractor inserted. The "three sisters" were identified and cauterized. Bursal tissues were removed to improve visualization. The biceps tendon was identified and followed proximally, splitting the rotator interval. The subscapularis tendon was retracted medially along with its lesser tuberosity fragment. This enabled access to the glenohumeral joint. The humeral head fracture fragment was removed in its entirety, as well as several smaller fragments.  Attention was redirected to the glenoid. The labrum was debrided circumferentially before the center of the glenoid was marked with electrocautery. The guidewire was drilled into the glenoid neck using the appropriate guide. After verifying its position, it was overreamed with the mini-baseplate reamer to create a flat surface. The permanent mini-baseplate was impacted into place. It was stabilized with a 20 x 6.5 mm central screw and four peripheral screws. Locking screws were placed superiorly and inferiorly while nonlocking screws were placed anteriorly and posteriorly. The permanent 36 mm glenosphere was then impacted into place and its Morse taper locking mechanism verified using manual distraction.  Attention was directed to the humeral side. The humeral canal was reamed sequentially beginning with the end-cutting reamer then progressing from a 4 mm reamer up to an 8 mm reamer. This provided excellent circumferential chatter. The 5 mm, 6 mm, and 7 mm taps were used before the 7 mm centralizer was inserted and seated firmly. The canal was prepared for cementing by irrigating and thoroughly with bacitracin saline solution via the jet lavage system, then packing it with a Neo-Synephrine soaked vaginal pack. Meanwhile, cement was mixed on the back table. When it was ready,  it was inserted into the proximal humerus. The 4 mm stem was inserted at approximately 30 of retroversion and held firmly until  the cement had hardened. Of note, a #2 FiberWire was passed through 2 drill holes in the lateral cortex of the proximal humerus to help with postoperative subscapularis tendon repair if necessary. Once the cement had hardened, a trial reduction performed using the standard trial humeral platform. The arm demonstrated excellent range of motion as the hand could be brought across the chest to the opposite shoulder and brought to the top of the patient's head and to the patient's ear. The shoulder appeared stable throughout this range of motion. The joint was dislocated and the trial components removed. The permanent 44 mm humeral platform with the standard insert was put together on the back table and impacted into place. Again, the [facility] taper locking mechanism was verified using manual distraction. The shoulder was relocated using two finger pressure and again placed through a range of motion with the findings as described above.  The wound was copiously irrigated with bacitracin saline solution using the jet lavage system before a total of 20 cc of Exparel diluted out to 60 cc with normal saline and 30 cc of 0.5% Sensorcaine with epinephrine was injected into the pericapsular and peri-incisional tissues to help with postoperative analgesia. The subscapularis tendon was repaired to the biceps tendon stump in an effort to provide some anchoring for the subscapularis tendon using #2 FiberWire interrupted sutures. There was insufficient tissue to attempt to repair the remainder of the rotator cuff. The biceps tendon also underwent a tenodesis using the #2 FiberWire that had been passed through the cortex.   The deltopectoral interval was closed using #0 Vicryl interrupted sutures before the subcutaneous tissues were closed using 2-0 Vicryl interrupted sutures. The skin was closed using staples. Prior to closing the skin, 1 g of transexemic acid in 10 cc of normal saline was injected intra-articularly to help with  postoperative bleeding. A sterile occlusive dressing was applied to the wound before the arm was placed into a shoulder immobilizer with an abduction pillow. A Polar Care system also was applied to the shoulder. The patient was then transferred back to a hospital bed before being awakened, extubated, and returned to the recovery room in satisfactory condition after tolerating the procedure well.

## 2016-04-16 NOTE — [place] FL2 (Signed)
[place] MEDICAID FL2 LEVEL OF CARE SCREENING TOOL     IDENTIFICATION  Patient Name: Cheryl Mueller Birthdate: [birth year] Sex: female Admission Date (Current Location): 04/16/2016  [place] and [place] Number:  Chiropodist and Address:  [facility], [address] [place], [place] [address]      Provider Number: [id number]  Attending Physician Name and Address:  Christena Flake, MD  Relative Name and Phone Number:       Current Level of Care: Hospital Recommended Level of Care: Skilled Nursing Facility Prior Approval Number:    Date Approved/Denied:   PASRR Number:  [id number])  Discharge Plan: SNF    Current Diagnoses: Patient Active Problem List   Diagnosis Date Noted  . 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 BP (high blood pressure) 10/20/2015  . HLD (hyperlipidemia) 10/20/2015  . Abdominal wall cellulitis 10/13/2015  . Anemia, iron deficiency 05/08/2015  . Deficiency in the vitamin folic acid 05/08/2015  . Compression fracture of lumbar vertebra (HCC) 05/08/2015  . Acute kidney injury (HCC) 04/20/2015  . Compression fracture of L1 lumbar vertebra (HCC) 04/18/2015  . Acute blood loss anemia 04/18/2015  . Hypokalemia 04/18/2015  . Dehydration 04/18/2015  . Hematoma 04/17/2015  . Cellulitis 04/17/2015  . Benign hypertension 04/17/2015  . Diabetes mellitus (HCC) 04/17/2015  . Cystocele, midline 08/11/2014  . Abnormal presence of protein in urine 06/20/2014  . Type 2 diabetes mellitus with other diabetic kidney complication 12/14/2013    Orientation RESPIRATION BLADDER Height & Weight     Self  O2 (Nasal Cannula 3L/min) Indwelling catheter Weight: 126 lb (57.2 kg) Height:  4' 11.5" (151.1 cm)  BEHAVIORAL SYMPTOMS/MOOD NEUROLOGICAL BOWEL NUTRITION STATUS   (None. )  (None. ) Continent Diet (Diet: Carb Modified)  AMBULATORY STATUS COMMUNICATION OF NEEDS  Skin   Extensive Assist Verbally Surgical wounds (Incision: Left Shoulder )                       Personal Care Assistance Level of Assistance  Bathing, Feeding, Dressing Bathing Assistance: Limited assistance Feeding assistance: Independent Dressing Assistance: Limited assistance     Functional Limitations Info  Sight, Hearing, Speech Sight Info: Adequate Hearing Info: Adequate Speech Info: Adequate    SPECIAL CARE FACTORS FREQUENCY  PT (By licensed PT), OT (By licensed OT)     PT Frequency:  (5) OT Frequency:  (5)            Contractures      Additional Factors Info  Code Status, Allergies, Insulin Sliding Scale Code Status Info:  (Full Code) Allergies Info:  ( Alendronate, Atorvastatin, Formaldehyde)   Insulin Sliding Scale Info:  (novoLog)       Current Medications (04/16/2016):  This is the current hospital active medication list Current Facility-Administered Medications  Medication Dose Route Frequency Provider Last Rate Last Dose  . 0.9 % NaCl with KCl 20 mEq/ L  infusion   Intravenous Continuous Christena Flake, MD      . acetaminophen (TYLENOL) tablet 650 mg  650 mg Oral Q6H PRN Christena Flake, MD       Or  . acetaminophen (TYLENOL) suppository 650 mg  650 mg Rectal Q6H PRN Christena Flake, MD      . acetaminophen (TYLENOL) tablet 1,000 mg  1,000 mg Oral Q6H Christena Flake, MD      . Cheryl Mueller ON 04/17/2016] amLODipine (NORVASC) tablet 10  mg  10 mg Oral Daily Christena FlakeJohn J Poggi, MD      . aspirin EC tablet 81 mg  81 mg Oral Daily Christena FlakeJohn J Poggi, MD      . atorvastatin (LIPITOR) tablet 80 mg  80 mg Oral QHS Christena FlakeJohn J Poggi, MD      . bisacodyl (DULCOLAX) suppository 10 mg  10 mg Rectal Daily PRN Christena FlakeJohn J Poggi, MD      . calcium-vitamin D (OSCAL WITH D) 500-200 MG-UNIT per tablet 1 tablet  1 tablet Oral BID Christena FlakeJohn J Poggi, MD      . diphenhydrAMINE (BENADRYL) 12.5 MG/5ML elixir 12.5-25 mg  12.5-25 mg Oral Q4H PRN Christena FlakeJohn J Poggi, MD      . docusate sodium (COLACE) capsule 100 mg   100 mg Oral BID Christena FlakeJohn J Poggi, MD      . Cheryl Mueller[START ON 04/17/2016] enoxaparin (LOVENOX) injection 40 mg  40 mg Subcutaneous Q24H Christena FlakeJohn J Poggi, MD      . Cheryl Mueller[START ON 04/17/2016] glimepiride (AMARYL) tablet 4 mg  4 mg Oral Q breakfast Christena FlakeJohn J Poggi, MD      . hydrochlorothiazide (MICROZIDE) capsule 12.5 mg  12.5 mg Oral Daily Christena FlakeJohn J Poggi, MD      . HYDROmorphone (DILAUDID) injection 1-2 mg  1-2 mg Intravenous Q2H PRN Christena FlakeJohn J Poggi, MD      . insulin aspart (novoLOG) injection 0-15 Units  0-15 Units Subcutaneous TID WC Christena FlakeJohn J Poggi, MD      . ketorolac (TORADOL) 15 MG/ML injection 15 mg  15 mg Intravenous Once Christena FlakeJohn J Poggi, MD      . ketorolac (TORADOL) 15 MG/ML injection 7.5 mg  7.5 mg Intravenous Q6H Christena FlakeJohn J Poggi, MD      . lactulose (CHRONULAC) 10 GM/15ML solution 20 g  20 g Oral BID PRN Christena FlakeJohn J Poggi, MD      . Cheryl Mueller[START ON 04/17/2016] lisinopril (PRINIVIL,ZESTRIL) tablet 40 mg  40 mg Oral BID Christena FlakeJohn J Poggi, MD      . magnesium hydroxide (MILK OF MAGNESIA) suspension 30 mL  30 mL Oral Daily PRN Christena FlakeJohn J Poggi, MD      . metFORMIN (GLUCOPHAGE) tablet 1,000 mg  1,000 mg Oral BID WC Christena FlakeJohn J Poggi, MD      . metoCLOPramide (REGLAN) tablet 5-10 mg  5-10 mg Oral Q8H PRN Christena FlakeJohn J Poggi, MD       Or  . metoCLOPramide (REGLAN) injection 5-10 mg  5-10 mg Intravenous Q8H PRN Christena FlakeJohn J Poggi, MD      . Cheryl Mueller[START ON 04/17/2016] mometasone (ELOCON) 0.1 % cream 1 application  1 application Topical Daily PRN Christena FlakeJohn J Poggi, MD      . ondansetron (ZOFRAN) tablet 4 mg  4 mg Oral Q6H PRN Christena FlakeJohn J Poggi, MD       Or  . ondansetron (ZOFRAN) injection 4 mg  4 mg Intravenous Q6H PRN Christena FlakeJohn J Poggi, MD      . oxyCODONE (Oxy IR/ROXICODONE) immediate release tablet 5-10 mg  5-10 mg Oral Q3H PRN Christena FlakeJohn J Poggi, MD      . pantoprazole (PROTONIX) EC tablet 40 mg  40 mg Oral Daily Christena FlakeJohn J Poggi, MD      . rOPINIRole (REQUIP) tablet 0.25 mg  0.25 mg Oral QHS Christena FlakeJohn J Poggi, MD      . sodium phosphate (FLEET) 7-19 GM/118ML enema 1 enema  1 enema Rectal Once  PRN Christena FlakeJohn J Poggi, MD      . vancomycin (  VANCOCIN) IVPB 1000 mg/200 mL premix  1,000 mg Intravenous Q12H Christena Flake, MD         Discharge Medications: Please see discharge summary for a list of discharge medications.  Relevant Imaging Results:  Relevant Lab Results:   Additional Information  (SSN: [id number])  Ralene Bathe, Student-Social Work

## 2016-04-16 NOTE — Anesthesia Procedure Notes (Signed)
Procedure Name: Intubation Date/Time: 04/16/2016 7:52 AM Performed by: Shirlee LimerickMARION, [name] Pre-anesthesia Checklist: Patient identified, Emergency Drugs available, Suction available and Patient being monitored Patient Re-evaluated:Patient Re-evaluated prior to inductionOxygen Delivery Method: Circle system utilized Preoxygenation: Pre-oxygenation with 100% oxygen Intubation Type: IV induction Laryngoscope Size: Mac and 3 Grade View: Grade II Tube type: Oral Tube size: 6.5 mm Number of attempts: 1 Placement Confirmation: ETT inserted through vocal cords under direct vision,  positive ETCO2 and breath sounds checked- equal and bilateral Secured at: 20 cm Tube secured with: Tape Dental Injury: Teeth and Oropharynx as per pre-operative assessment

## 2016-04-16 NOTE — OR Nursing (Signed)
Noticed several areas on skin that patient has scratched and left scabbed areas.  One spot is on backside of left shoulder, lower left shoulder and just above waist line.  Patient is itching all the time but unsure why.  She arrived with a list of 3 pain medications and thinks she might be taking several of them, not sure why.  Dr. Joice LoftsPoggi aware and will change that so she does not take too much.  Patient complaining of severe pain all morning and requesting medicine.  Anesthesia prefers her to go to OR to get a block rather than provide a light pain relief now.

## 2016-04-16 NOTE — Transfer of Care (Signed)
Immediate Anesthesia Transfer of Care Note  Patient: Cheryl Mueller  Procedure(s) Performed: Procedure(s): REVERSE SHOULDER ARTHROPLASTY (Left)  Patient Location: PACU  Anesthesia Type:General  Level of Consciousness: awake and patient cooperative  Airway & Oxygen Therapy: Patient Spontanous Breathing and Patient connected to nasal cannula oxygen  Post-op Assessment: Report given to RN and Post -op Vital signs reviewed and stable  Post vital signs: Reviewed and stable  Last Vitals:  Vitals:   04/16/16 0626  BP: (!) 147/72  Pulse: 93  Resp: 18  Temp: 36.7 C    Last Pain:  Vitals:   04/16/16 0626  TempSrc: Oral  PainSc: 8       Patients Stated Pain Goal: 1 (04/16/16 0626)  Complications: No apparent anesthesia complications

## 2016-04-16 NOTE — Anesthesia Procedure Notes (Signed)
Anesthesia Regional Block:  Interscalene brachial plexus block  Pre-Anesthetic Checklist: ,, timeout performed, Correct Patient, Correct Site, Correct Laterality, Correct Procedure, Correct Position, site marked, Risks and benefits discussed,  Surgical consent,  Pre-op evaluation,  At surgeon's request and post-op pain management  Laterality: Left and Upper  Prep: chloraprep       Needles:  Injection technique: Single-shot  Needle Type: Echogenic Needle     Needle Length: 9cm 9 cm Needle Gauge: 22 and 22 G    Additional Needles:  Procedures: ultrasound guided (picture in chart) Interscalene brachial plexus block Narrative:  Start time: 04/16/2016 7:39 AM End time: 04/16/2016 7:42 AM Injection made incrementally with aspirations every 5 mL.  Performed by: Personally  Anesthesiologist: Lenard SimmerKARENZ, ANDREW  Additional Notes: Patient tolerated the procedure well.  No complications.

## 2016-04-16 NOTE — H&P (Signed)
Paper H&P to be scanned into permanent record. H&P reviewed. No changes. 

## 2016-04-16 NOTE — Progress Notes (Signed)
Pt arrived to floor lethargic but easy to arouse. Unable to answer some questions and seemed a bit confused. About an hour later she became more clear and asked appropriate questions as well as answered questions clearly. She has no real complaints of pain. She has expressed to physical therapy that she has been having difficultly walking lately and is off balance. Dr Joice LoftsPoggi made aware by PT.

## 2016-04-16 NOTE — Evaluation (Signed)
Physical Therapy Evaluation Patient Details Name: Cheryl Mueller MRN: [id number] DOB: Mar 15, 1941 Today's Date: 04/16/2016   History of Present Illness  Pt suffered a fall with a proximal L humeral fracture. Pt underwent L TSA with some post-op confusion. She reports that approximately 6 weeks ago she woke up and was unable to walk. She reports that this was acute onset. She has seen a physician at [facility] without any explanation of her symptoms. She was being referred to neurology but before she could go to an appointment she fell and broke her L shoulder.   Clinical Impression  Pt admitted with above diagnosis. Pt currently with functional limitations due to the deficits listed below (see PT Problem List). Pt requires modA+1 for bed mobility and transfers and maxA+1 for ambulation with +2 assist for equipment. She is initially very unsteady in sitting falling posteriorly and to the R. However with extended time is able to remain upright with feet supported and floor and CGA only. During ambulation pt continually falls backwards and is supported by therapist. She is unable to shift her weight forward and unable to remain upright without support. She requires assist for placement of hemiwalker and has difficulty utilizing for support. Pt has been seen by [facility] orthopedic surgery with full imaging of her spine. Per medical records they were sending her to see neurology for additional work-up regarding her new bilateral LE weakness and unsteadiness. Prior to neurology appointment pt fell and broke her L shoulder resulting in current admission with TSA. Spoke with Dr. Joice Lofts who agrees to consult neurology while patient is currently admitted. Pt will need SNF placement at discharge in order facilitate safe return to prior level of function at home. Pt will benefit from skilled PT services to address deficits in strength, balance, and mobility in order to return to full function at home.      Follow Up  Recommendations SNF    Equipment Recommendations  Other (comment) (TBD at facility. May need hemiwalker)    Recommendations for Other Services Other (comment) (Neurology)     Precautions / Restrictions Precautions Precautions: Fall;Shoulder Type of Shoulder Precautions: In sling NWB Shoulder Interventions: Shoulder sling/immobilizer Required Braces or Orthoses: Sling Restrictions Weight Bearing Restrictions: Yes LUE Weight Bearing: Non weight bearing      Mobility  Bed Mobility Overal bed mobility: Needs Assistance Bed Mobility: Supine to Sit     Supine to sit: Mod assist     General bed mobility comments: Pt requires modA+1 for supine to sit, exiting bed on the R side with heavy use of bed rail. Once sitting upright at EOB pt intermittently falls posteriorly and to the R. She is eventually able to maintain balance with CGA only  Transfers Overall transfer level: Needs assistance Equipment used: Hemi-walker Transfers: Sit to/from Stand Sit to Stand: Mod assist         General transfer comment: Pt requires assist for sit to stand due to LE weakness but especially for poor standing balance with posterior listing. Unable to self correct with cues for anterior weight shift correction  Ambulation/Gait Ambulation/Gait assistance: Max assist;+2 safety/equipment Ambulation Distance (Feet): 15 Feet Assistive device: Hemi-walker Gait Pattern/deviations: Shuffle;Step-to pattern Gait velocity: Decreased Gait velocity interpretation: <1.8 ft/sec, indicative of risk for recurrent falls General Gait Details: Pt with very short, shuffling steps to ambulate around end of bed and to recliner. Pt with intermittent maxA+1 support due to leaning posteriorly heavily during ambulation. Unable to correct with cues. Pt uses hemiwalker in RUE  but requires assist with placement and sequencing. Very unsteady. SaO2>95% on room air throughout session  Stairs            Wheelchair  Mobility    Modified Rankin (Stroke Patients Only)       Balance Overall balance assessment: Needs assistance Sitting-balance support: No upper extremity supported Sitting balance-Leahy Scale: Fair Sitting balance - Comments: Initially poor with posterior leaning and falling to the R. Improves with extended time in sitting                                     Pertinent Vitals/Pain Pain Assessment: 0-10 Pain Score: 7  Pain Location: L shoulder Pain Descriptors / Indicators: Sore Pain Intervention(s): Monitored during session;Patient requesting pain meds-RN notified    Home Living Family/patient expects to be discharged to:: Skilled nursing facility Living Arrangements: Spouse/significant other Available Help at Discharge: Family Type of Home: Other(Comment) (Condo) Home Access: Stairs to enter Entrance Stairs-Rails: Can reach both Entrance Stairs-Number of Steps: 4 Home Layout: Two level;Able to live on main level with bedroom/bathroom Home Equipment: Dan Humphreys - 2 wheels;Walker - 4 wheels;Cane - single point;Shower seat (No BSC, hospital bed or wheelchair)      Prior Function Level of Independence: Needs assistance   Gait / Transfers Assistance Needed: Ambulating with rolling walker prior to LUE fracture but has been having increased difficulty walking over the last 1 month. Since the fracture has limited her mobility and used HHA for ambulation  ADL's / Homemaking Assistance Needed: Assist from husband        Hand Dominance   Dominant Hand: Right    Extremity/Trunk Assessment   Upper Extremity Assessment: LUE deficits/detail       LUE Deficits / Details: RUE grossly WFL. LUE in immobilizer. Grip strength intact and pt reports full sensation to light touch.    Lower Extremity Assessment: Generalized weakness         Communication   Communication: No difficulties  Cognition Arousal/Alertness: Awake/alert Behavior During Therapy: WFL for tasks  assessed/performed Overall Cognitive Status: History of cognitive impairments - at baseline (Family reports some baseline confusion, pt denies)                      General Comments      Exercises     Assessment/Plan    PT Assessment Patient needs continued PT services  PT Problem List Decreased strength;Decreased balance;Decreased mobility;Decreased safety awareness;Pain          PT Treatment Interventions DME instruction;Gait training;Therapeutic activities;Therapeutic exercise;Balance training;Neuromuscular re-education;Patient/family education;Stair training;Manual techniques    PT Goals (Current goals can be found in the Care Plan section)  Acute Rehab PT Goals Patient Stated Goal: Improve her balance and walking PT Goal Formulation: With patient/family Time For Goal Achievement: 04/30/16 Potential to Achieve Goals: Fair    Frequency BID   Barriers to discharge Decreased caregiver support Lives with husband but pt is very unsteady with ambulation    Co-evaluation               End of Session Equipment Utilized During Treatment: Gait belt Activity Tolerance: Patient tolerated treatment well Patient left: in chair;with call bell/phone within reach;with chair alarm set;with family/visitor present;with nursing/sitter in room;Other (comment) (polar care in place, pillow under elbow) Nurse Communication: Mobility status;Other (comment) (Recent neurological issues)         Time: 1610-9604 PT Time  Calculation (min) (ACUTE ONLY): 56 min   Charges:   PT Evaluation $PT Eval High Complexity: 1 Procedure PT Treatments $Gait Training: 8-22 mins   PT G Codes:       Sharalyn InkJason D Huprich PT, DPT   Huprich,Jason 04/16/2016, 4:16 PM

## 2016-04-16 NOTE — OR Nursing (Signed)
Patient arrived via wheelchair and needed complete assist to transfer on to bed.  States she woke up several weeks ago and was unable to walk.  She does not know why and [facility]. She is a poor historian as to what exactly they are looking at for a reason.... Muscular, vascular,etc.

## 2016-04-17 ENCOUNTER — Encounter
Admission: RE | Admit: 2016-04-17 | Discharge: 2016-04-17 | Disposition: A | Discharge status: Home / Self Care | Payer: Medicare Other | Source: Ambulatory Visit | Attending: Internal Medicine | Admitting: Internal Medicine

## 2016-04-17 ENCOUNTER — Encounter: Payer: Self-pay | Admitting: Surgery

## 2016-04-17 DIAGNOSIS — R2681 Unsteadiness on feet: Secondary | ICD-10-CM

## 2016-04-17 LAB — BASIC METABOLIC PANEL
Anion gap: 8 (ref 5–15)
BUN: 9 mg/dL (ref 6–20)
CHLORIDE: 104 mmol/L (ref 101–111)
CO2: 26 mmol/L (ref 22–32)
CREATININE: 0.45 mg/dL (ref 0.44–1.00)
Calcium: 8.9 mg/dL (ref 8.9–10.3)
GFR calc Af Amer: >60 mL/min (ref >60)
GFR calc non Af Amer: >60 mL/min (ref >60)
Glucose, Bld: 171 mg/dL — ABNORMAL HIGH (ref 65–99)
Potassium: 3.7 mmol/L (ref 3.5–5.1)
Sodium: 138 mmol/L (ref 135–145)

## 2016-04-17 LAB — GLUCOSE, CAPILLARY
Glucose-Capillary: 170 mg/dL — ABNORMAL HIGH (ref 65–99)
Glucose-Capillary: 193 mg/dL — ABNORMAL HIGH (ref 65–99)

## 2016-04-17 LAB — CBC WITH DIFFERENTIAL/PLATELET
Basophils Absolute: 0 10*3/uL (ref 0–0.1)
Basophils Relative: 0 %
EOS ABS: 0 10*3/uL (ref 0–0.7)
Eosinophils Relative: 0 %
HEMATOCRIT: 32.3 % — AB (ref 35.0–47.0)
HEMOGLOBIN: 10.7 g/dL — AB (ref 12.0–16.0)
LYMPHS ABS: 0.3 10*3/uL — AB (ref 1.0–3.6)
Lymphocytes Relative: 3 %
MCH: 29.4 pg (ref 26.0–34.0)
MCHC: 33.2 g/dL (ref 32.0–36.0)
MCV: 88.4 fL (ref 80.0–100.0)
MONOS PCT: 7 %
Monocytes Absolute: 1 10*3/uL — ABNORMAL HIGH (ref 0.2–0.9)
NEUTROS PCT: 90 %
Neutro Abs: 12.5 10*3/uL — ABNORMAL HIGH (ref 1.4–6.5)
Platelets: 395 10*3/uL (ref 150–440)
RBC: 3.65 MIL/uL — ABNORMAL LOW (ref 3.80–5.20)
RDW: 14.2 % (ref 11.5–14.5)
WBC: 13.9 10*3/uL — ABNORMAL HIGH (ref 3.6–11.0)

## 2016-04-17 LAB — URINE CULTURE

## 2016-04-17 MED ORDER — OXYCODONE HCL 5 MG PO TABS: 5.0000 mg | ORAL_TABLET | ORAL | 0 refills | Status: DC | PRN

## 2016-04-17 NOTE — Clinical Social Work Note (Signed)
Clinical Social Work Assessment  Patient Details  Name: Cheryl Mueller MRN: [id number] Date of Birth: Nov 13, 1940  Date of referral:  04/17/16               Reason for consult:  Facility Placement, Other (Comment Required) (From [facility] )                Permission sought to share information with:  Chartered certified accountant granted to share information::  Yes, Verbal Permission Granted  Name::        Agency::     Relationship::     Contact Information:     Housing/Transportation Living arrangements for the past 2 months:  Single Family Home Source of Information:  Patient Patient Interpreter Needed:  None Criminal Activity/Legal Involvement Pertinent to Current Situation/Hospitalization:  No - Comment as needed Significant Relationships:  Adult Children, Spouse Lives with:  Spouse, Facility Resident Do you feel safe going back to the place where you live?  Yes Need for family participation in patient care:  Yes (Comment)  Care giving concerns:  Patient is from [facility] for short term rehab.    Social Worker assessment / plan:  Holiday representative (CSW) received SNF consult. PT is recommending SNF. CSW met with patient alone at bedside to address consult. Patient reported that she has been at [facility] for the past couple of days since she hurt her shoulder. Patient reported that she lives in [place] with her husband. Patient reported that she is agreeable to returning to [facility] today.   Patient is medically stable for D/C back to [facility] today. Per [facility] admissions coordinator at [facility] patient can return to room 221-B. RN will call report at [phone] and arrange EMS for transport. CSW sent D/C orders to [facility] via [place]. Patient is aware of above. Per patient her husband will transport her to [place]. CSW left patient's husband a Advertising account executive. RN aware of above. Please reconsult if future social work needs arise. CSW signing off.    Employment status:  Retired Nurse, adult PT Recommendations:  [place] / Referral to community resources:  [place]  Patient/Family's Response to care:  Patient is agreeable to returning to [facility] today.   Patient/Family's Understanding of and Emotional Response to Diagnosis, Current Treatment, and Prognosis:  Patient was pleasant and thanked CSW for visit.   Emotional Assessment Appearance:  Appears stated age Attitude/Demeanor/Rapport:    Affect (typically observed):  Accepting, Adaptable, Pleasant Orientation:  Oriented to Self, Oriented to Place, Oriented to  Time, Fluctuating Orientation (Suspected and/or reported Sundowners) Alcohol / Substance use:  Not Applicable Psych involvement (Current and /or in the community):  No (Comment)  Discharge Needs  Concerns to be addressed:  Discharge Planning Concerns Readmission within the last 30 days:  No Current discharge risk:  None Barriers to Discharge:  No Barriers Identified   Marykate Heuberger, Veronia Beets, LCSW 04/17/2016, 12:11 PM

## 2016-04-17 NOTE — Discharge Summary (Signed)
Physician Discharge Summary  Patient ID: Cheryl Mueller MRN: [id number] DOB/AGE: 77-15-1942 77 y.o.  Admit date: 04/16/2016 Discharge date: 04/17/2016  Admission Diagnoses:  CLOSED 3 PART FRACTURE OF SURGICAL NECK OF LEFT HUMERUS  Discharge Diagnoses: Patient Active Problem List   Diagnosis Date Noted  . 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 BP (high blood pressure) 10/20/2015  . HLD (hyperlipidemia) 10/20/2015  . Abdominal wall cellulitis 10/13/2015  . Anemia, iron deficiency 05/08/2015  . Deficiency in the vitamin folic acid 05/08/2015  . Compression fracture of lumbar vertebra (HCC) 05/08/2015  . Acute kidney injury (HCC) 04/20/2015  . Compression fracture of L1 lumbar vertebra (HCC) 04/18/2015  . Acute blood loss anemia 04/18/2015  . Hypokalemia 04/18/2015  . Dehydration 04/18/2015  . Hematoma 04/17/2015  . Cellulitis 04/17/2015  . Benign hypertension 04/17/2015  . Diabetes mellitus (HCC) 04/17/2015  . Cystocele, midline 08/11/2014  . Abnormal presence of protein in urine 06/20/2014  . Type 2 diabetes mellitus with other diabetic kidney complication 12/14/2013    Past Medical History:  Diagnosis Date  . Anxiety   . Arthritis   . Diabetes mellitus without complication (HCC)    Non Insulin dependant  . Headache    h/o migraines  . Hyperlipemia   . Hypertension   . Restless leg syndrome   . Stroke [facility]) 2002   mini stroke  . TIA (transient ischemic attack)    approx 15 years ago  . Vertigo    hx of     Transfusion: None   Consultants (if any): Treatment Team:  Thana Farr, MD  Discharged Condition: Improved  Hospital Course: Cheryl Mueller is an 75 y.o. female who was admitted 04/16/2016 with a diagnosis of a closed displaced, 3-part left proximal humerus fracture and went to the operating room on 04/16/2016 and underwent the above named procedures.   Patient has had a slow  decline in balance and walking ability, consult for neurology was placed prior to discharge today.   Surgeries: Procedure(s): REVERSE SHOULDER ARTHROPLASTY on 04/16/2016 Patient tolerated the surgery well. Taken to PACU where she was stabilized and then transferred to the orthopedic floor.  Started on Lovenox 40mg  q 24 hrs. Foot pumps applied bilaterally at 80 mm. Heels elevated on bed with rolled towels. No evidence of DVT. Negative Homan. Physical therapy started on day #1 for gait training and transfer. OT started day #1 for ADL and assisted devices.  Patient's IV and Foley were d/c on POD1.  Implants: Bi[employer] Comprehensive system with a #4 cemented mini-humeral stem, a 44 mm humeral tray with a standard insert, and a mini-base plate with a 36 mm glenosphere.  She was given perioperative antibiotics:  Anti-infectives    Start     Dose/Rate Route Frequency Ordered Stop   04/16/16 1800  vancomycin (VANCOCIN) IVPB 1000 mg/200 mL premix     1,000 mg 200 mL/hr over 60 Minutes Intravenous Every 12 hours 04/16/16 1208 04/16/16 1829   04/16/16 0600  vancomycin (VANCOCIN) IVPB 1000 mg/200 mL premix     1,000 mg 200 mL/hr over 60 Minutes Intravenous  Once 04/16/16 0213 04/16/16 0810   04/15/16 1600  ceFAZolin (ANCEF) IVPB 2g/100 mL premix  Status:  Discontinued     2 g 200 mL/hr over 30 Minutes Intravenous  Once 04/15/16 1555 04/15/16 1557    .  She was given sequential compression devices, early ambulation, and Lovenox for DVT prophylaxis.  She benefited maximally  from the hospital stay and there were no complications.    Recent vital signs:  Vitals:   04/17/16 0009 04/17/16 0318  BP: (!) 172/72 (!) 180/76  Pulse: 93 92  Resp: 18 19  Temp: 97.9 F (36.6 C) 98.4 F (36.9 C)    Recent laboratory studies:  Lab Results  Component Value Date   HGB 10.7 (L) 04/17/2016   HGB 11.9 (L) 04/16/2016   HGB 11.7 (L) 04/15/2016   Lab Results  Component Value Date   WBC 13.9 (H)  04/17/2016   PLT 395 04/17/2016   Lab Results  Component Value Date   INR 0.92 04/15/2016   Lab Results  Component Value Date   NA 138 04/17/2016   K 3.7 04/17/2016   CL 104 04/17/2016   CO2 26 04/17/2016   BUN 9 04/17/2016   CREATININE 0.45 04/17/2016   GLUCOSE 171 (H) 04/17/2016    Discharge Medications:     Medication List    STOP taking these medications   HYDROcodone-acetaminophen 7.5-325 MG tablet Commonly known as:  NORCO   oxyCODONE-acetaminophen 5-325 MG tablet Commonly known as:  ROXICET     TAKE these medications   amLODipine 10 MG tablet Commonly known as:  NORVASC Take 10 mg by mouth daily.   aspirin EC 81 MG tablet Take 81 mg by mouth daily.   aspirin-acetaminophen-caffeine 250-250-65 MG tablet Commonly known as:  EXCEDRIN MIGRAINE Take 2 tablets by mouth every 8 (eight) hours as needed for headache.   atorvastatin 80 MG tablet Commonly known as:  LIPITOR Take 80 mg by mouth at bedtime.   CALCIUM 600+D 600-400 MG-UNIT tablet Generic drug:  Calcium Carbonate-Vitamin D Take 1 tablet by mouth 2 (two) times daily.   diphenhydrAMINE 25 mg capsule Commonly known as:  BENADRYL Take 25 mg by mouth every 6 (six) hours as needed for itching.   glimepiride 4 MG tablet Commonly known as:  AMARYL Take 1 tablet (4 mg total) by mouth daily with breakfast.   hydrochlorothiazide 12.5 MG capsule Commonly known as:  MICROZIDE Take 12.5 mg by mouth daily.   HYDROmorphone 2 MG tablet Commonly known as:  DILAUDID Take 1 tablet (2 mg total) by mouth every 12 (twelve) hours as needed for severe pain.   hydrOXYzine 25 MG tablet Commonly known as:  ATARAX/VISTARIL Take 25 mg by mouth 3 (three) times daily as needed.   IBUPROFEN PM 200-38 MG Tabs Generic drug:  Ibuprofen-Diphenhydramine Cit Take 2 tablets by mouth at bedtime as needed (for sleep).   lactulose 10 GM/15ML solution Commonly known as:  CHRONULAC Take 20 g by mouth 2 (two) times daily as  needed for mild constipation.   lidocaine 5 % Commonly known as:  LIDODERM Place 1 patch onto the skin every 12 (twelve) hours. Remove & Discard patch within 12 hours or as directed by MD   lisinopril 40 MG tablet Commonly known as:  PRINIVIL,ZESTRIL Take 40 mg by mouth 2 (two) times daily.   metFORMIN 1000 MG tablet Commonly known as:  GLUCOPHAGE Take 1,000 mg by mouth 2 (two) times daily with a meal.   mometasone 0.1 % cream Commonly known as:  ELOCON Apply 1 application topically daily as needed (for itching).   oxyCODONE 5 MG immediate release tablet Commonly known as:  Oxy IR/ROXICODONE Take 1-2 tablets (5-10 mg total) by mouth every 4 (four) hours as needed for breakthrough pain.   potassium chloride 10 MEQ tablet Commonly known as:  K-DUR,KLOR-CON Take 1 tablet  by mouth daily.   rOPINIRole 0.25 MG tablet Commonly known as:  REQUIP Take 1 tablet (0.25 mg total) by mouth at bedtime.   sitaGLIPtin 100 MG tablet Commonly known as:  JANUVIA Take 1 tablet by mouth daily.   sulfamethoxazole-trimethoprim 800-160 MG tablet Commonly known as:  BACTRIM DS,SEPTRA DS Take 1 tablet by mouth 2 (two) times daily.   traZODone 50 MG tablet Commonly known as:  DESYREL Take 0.5 tablets (25 mg total) by mouth at bedtime.       Diagnostic Studies: Dg Chest 2 View  Result Date: 04/15/2016 CLINICAL DATA:  Pt having pain in left side of chest. Hx of HTN, diabetes, non-smoker. No previous hx of any heart or lung conditions. Pt is having surgery on left humerus 04/16/16. EXAM: CHEST  2 VIEW COMPARISON:  11/26/2012 FINDINGS: Comminuted displaced fracture the proximal left humerus, which has been evaluated recently with radiographs in left shoulder CT. Cardiac silhouette is normal in size. No mediastinal or hilar masses. No evidence of adenopathy. Clear lungs.  No pleural effusion or pneumothorax. There are multiple vertebral compression fractures. Two contiguous fractures at the  thoracolumbar junction have been treated with vertebroplasty. IMPRESSION: 1. No acute cardiopulmonary disease. Electronically Signed   By: Amie Portland M.D.   On: 04/15/2016 14:00   Dg Elbow Complete Left  Result Date: 04/05/2016 CLINICAL DATA:  Larey Seat at the gross restored today. The olecranon region pain. EXAM: LEFT ELBOW - COMPLETE 3+ VIEW COMPARISON:  None. FINDINGS: There is no evidence of fracture, dislocation, or joint effusion. There is no evidence of arthropathy or other focal bone abnormality. Soft tissues are unremarkable. IMPRESSION: Negative. Electronically Signed   By: Paulina Fusi M.D.   On: 04/05/2016 16:52   Ct Shoulder Left Wo Contrast  Result Date: 04/10/2016 CLINICAL DATA:  Proximal humeral fracture after falling 6 days ago. EXAM: CT OF THE LEFT SHOULDER WITHOUT CONTRAST TECHNIQUE: Multidetector CT imaging was performed according to the standard protocol. Multiplanar CT image reconstructions were also generated. COMPARISON:  Report only from outside radiographs dated 04/08/2016. FINDINGS: Bones/Joint/Cartilage The humeral head is located. There is an extensively comminuted and displaced fracture of the left humeral surgical neck. This fracture involves both tuberosities. There is apex anterior angulation, impaction and up to 1.5 cm of anterior displacement at the fracture. There are multiple fracture fragments within the joint, largest superiorly and posteriorly. Some of these fracture fragments are interposed between the humeral head and glenoid. No definite scapular fracture. There is a large shoulder joint effusion. The visualized left clavicle and left chest wall appear intact. There is mild left lung atelectasis. Aortic atherosclerosis noted. Ligaments Not relevant for exam/indication. Muscles and Tendons There is some edema within the deltoid and rotator cuff musculature. No large hematoma identified. The supraspinatus and infraspinatus tendons likely insert on the avulsed  fractures of the greater tuberosity. Soft Tissues As above, there is a shoulder joint effusion with multiple intra-articular fracture fragments. There is ill-defined fluid extending into the left axilla. This is suspicious for associated anterior capsular injury. IMPRESSION: 1. Extensively comminuted and displaced fracture of the left humeral surgical neck. There are numerous large fracture fragments in the joint, and the rotator cuff likely inserts on avulsed fracture fragments of the greater tuberosity. 2. Large hemarthrosis with soft tissue edema extending into the axilla, suspicious for associated capsular injury. 3. No current dislocation or definite acute scapular injury. Electronically Signed   By: Carey Bullocks M.D.   On: 04/10/2016 11:56  Dg Shoulder Left Port  Result Date: 04/16/2016 CLINICAL DATA:  Post left shoulder replacement EXAM: LEFT SHOULDER - 1 VIEW COMPARISON:  CT 04/10/2016 FINDINGS: Changes of left shoulder replacement. Normal alignment. No visible hardware or bony complicating feature. IMPRESSION: Left shoulder replacement without visible complicating feature. Electronically Signed   By: Charlett NoseKevin  Dover M.D.   On: 04/16/2016 11:35    Disposition: Plan will be for discharge to SNF today after Neuro consult, oxycodone for pain, ASA 325 BID for DVT prophylaxis.    Follow-up Information    Meriel PicaJames L McGhee, PA-C Follow up in 14 day(s).   Specialty:  Physician Assistant Why:  Mindi SlickerStaple Removal. Contact information: [address] [facility] [place] [id number] [phone]          Signed: Meriel PicaJames L McGhee PA-C 04/17/2016, 7:53 AM

## 2016-04-17 NOTE — Discharge Instructions (Signed)
Shoulder Joint Replacement, Care After Refer to this sheet in the next few weeks. These instructions provide you with information on caring for yourself after your procedure. Your health care provider may also give you more specific instructions. Your treatment has been planned according to current medical practices, but problems sometimes occur. Call your health care provider if you have any problems or questions after your procedure. WHAT TO EXPECT AFTER THE PROCEDURE After your procedure, your arm and shoulder will typically be stiff and bruised. This will improve over time. HOME CARE INSTRUCTIONS   You may resume your normal diet and activities as directed by your surgeon.  You should regain full use of your shoulder in 6 weeks.  Your arm will be in a sling. You will need to wear this for 4-6 weeks after surgery.  Wear the sling every night for at least the first month, or as instructed by your surgeon.  Do not use your arm to push yourself up in bed or from a chair. This requires too much muscle.  Follow the program of home exercises suggested. Do the exercises 4-5 times a day for a month or as directed.  Try not to overuse your shoulder. Overusing the shoulder is easy to do if this is the first time you have been pain free in a long time. Early overuse of the shoulder may result in later problems.  Do not lift anything heavier than a cup of coffee for the first 6 weeks after surgery.  Ask for help. Your health care provider may be able to suggest a clinic or agency for this if you do not have home support.  Do not participate in contact sports or do any heavy lifting (more than 10 lb [4.5 kg]) for at least 6 months, or as directed.  Apply ice to the injured area for the first 2 days after surgery:  Put ice in a plastic bag.  Place a towel between your skin and the bag.  Leave the ice on for 20 minutes, 2-3 times a day.  Change dressings if necessary or as directed.  Only  take over-the-counter or prescription medicines for pain, discomfort, or fever as directed by your health care provider.  Keep all follow-up appointments as directed. SEEK MEDICAL CARE IF:  You have redness, swelling, or increasing pain in the wound.  You see pus coming from the wound.  You have a fever.  You notice a bad smell coming from the wound or dressing.  The edges of the wound break open after sutures or staples have been removed.  You have increasing pain with movement of the shoulder. SEEK IMMEDIATE MEDICAL CARE IF:   You develop a rash.  You have chest pain or shortness of breath.  You have any reaction or side effects to medicine given. MAKE SURE YOU:  Understand these instructions.  Will watch your condition.  Will get help right away if you are not doing well or get worse.   This information is not intended to replace advice given to you by your health care provider. Make sure you discuss any questions you have with your health care provider.  -Oxycodone as needed for pain. -Aspirin 325 twice a day for 14 days.

## 2016-04-17 NOTE — Care Management Important Message (Signed)
Important Message  Patient Details  Name: Cheryl Mueller MRN: [id number] Date of Birth: [birth year]   Medicare Important Message Given:  Yes    Marily MemosLisa M Cyndie Woodbeck, RN 04/17/2016, 10:48 AM

## 2016-04-17 NOTE — Anesthesia Postprocedure Evaluation (Signed)
Anesthesia Post Note  Patient: Cheryl Mueller  Procedure(s) Performed: Procedure(s) (LRB): REVERSE SHOULDER ARTHROPLASTY (Left)  Patient location during evaluation: PACU Anesthesia Type: General Level of consciousness: awake and alert Pain management: pain level controlled Vital Signs Assessment: post-procedure vital signs reviewed and stable Respiratory status: spontaneous breathing, nonlabored ventilation, respiratory function stable and patient connected to nasal cannula oxygen Cardiovascular status: blood pressure returned to baseline and stable Postop Assessment: no signs of nausea or vomiting Anesthetic complications: no    Last Vitals:  Vitals:   04/17/16 0318 04/17/16 0757  BP: (!) 180/76 (!) 190/77  Pulse: 92 82  Resp: 19 18  Temp: 36.9 C 37.1 C    Last Pain:  Vitals:   04/17/16 0757  TempSrc: Oral  PainSc:                  Lenard SimmerAndrew Sharron Petruska

## 2016-04-17 NOTE — Progress Notes (Signed)
Subjective: 1 Day Post-Op Procedure(s) (LRB): REVERSE SHOULDER ARTHROPLASTY (Left) Patient reports pain as mild.   Patient is well, and has had no acute complaints or problems Plan is to go Skilled nursing facility after hospital stay. Negative for chest pain and shortness of breath Fever: no Gastrointestinal:negative for nausea and vomiting  Objective: Vital signs in last 24 hours: Temp:  [97.9 F (36.6 C)-98.6 F (37 C)] 98.4 F (36.9 C) (10/25 0318) Pulse Rate:  [67-94] 92 (10/25 0318) Resp:  [15-22] 19 (10/25 0318) BP: (100-180)/(57-81) 180/76 (10/25 0318) SpO2:  [95 %-99 %] 96 % (10/25 0318) FiO2 (%):  [28 %] 28 % (10/24 1209)  Intake/Output from previous day:  Intake/Output Summary (Last 24 hours) at 04/17/16 0747 Last data filed at 04/17/16 0113  Gross per 24 hour  Intake             2035 ml  Output             3090 ml  Net            -1055 ml    Intake/Output this shift: No intake/output data recorded.  Labs:  Recent Labs  04/15/16 1124 04/16/16 0702 04/17/16 0423  HGB 11.7* 11.9* 10.7*    Recent Labs  04/15/16 1124 04/16/16 0702 04/17/16 0423  WBC 10.6  --  13.9*  RBC 3.89  --  3.65*  HCT 34.2* 35.0* 32.3*  PLT 418  --  395    Recent Labs  04/15/16 1124 04/16/16 0702 04/17/16 0423  NA 136 139 138  K 3.1* 3.8 3.7  CL 99*  --  104  CO2 27  --  26  BUN 14  --  9  CREATININE 0.59  --  0.45  GLUCOSE 210* 198* 171*  CALCIUM 9.0  --  8.9    Recent Labs  04/15/16 1124  INR 0.92     EXAM General - Patient is Alert, Appropriate and drowsy this AM Extremity - ABD soft Sensation intact distally Intact pulses distally Incision: dressing C/D/I No cellulitis present Dressing/Incision - clean, dry, no drainage Motor Function - intact, moving foot and toes well on exam, Patient is intact to light touch over the left deltoid this AM.  Past Medical History:  Diagnosis Date  . Anxiety   . Arthritis   . Diabetes mellitus without  complication (HCC)    Non Insulin dependant  . Headache    h/o migraines  . Hyperlipemia   . Hypertension   . Restless leg syndrome   . Stroke [facility](HCC) 2002   mini stroke  . TIA (transient ischemic attack)    approx 15 years ago  . Vertigo    hx of    Assessment/Plan: 1 Day Post-Op Procedure(s) (LRB): REVERSE SHOULDER ARTHROPLASTY (Left) Active Problems:   Status post reverse total shoulder replacement, left  Estimated body mass index is 25.02 kg/m as calculated from the following:   Height as of this encounter: 4' 11.5" (1.511 m).   Weight as of this encounter: 57.2 kg (126 lb). Advance diet Up with therapy D/C IV fluids   Labs reviewed, BP 180/76 with d/c fluids today. Remove foley today. Up with therapy. Pt has had difficulty walking over the last week, Neurology consult prior to the patient being discharged today. Plan will be for discharge to SNF this afternoon following Neuro Consult.  DVT Prophylaxis - Lovenox, Foot Pumps and TED hose Non-weightbearing to the left upper extremity.  Valeria BatmanJ. Lance Kaitlyn Franko, PA-C [facility] 04/17/2016, 7:47 AM

## 2016-04-17 NOTE — Progress Notes (Signed)
Cheryl SchoonerAnn C Mcbean to be D/C'd Nursing Home per MD order.  Discussed with the patient and all questions fully answered.  VSS, Skin clean, dry and intact without evidence of skin break down, no evidence of skin tears noted. IV catheter discontinued intact. Site without signs and symptoms of complications. Dressing and pressure applied.  An After Visit Summary was printed and given to the patient. Patient received prescription.  D/c education completed with patient/family including follow up instructions, medication list, d/c activities limitations if indicated, with other d/c instructions as indicated by MD - patient able to verbalize understanding, all questions fully answered.   Patient instructed to return to ED, call 911, or call MD for any changes in condition.   Patient escorted via WC, and D/C home via private auto.  Harvie HeckMelanie Jeyda Siebel 04/17/2016 10:46 AM  4

## 2016-04-17 NOTE — Progress Notes (Signed)
Sp[facility] with Ander SladeJoy, [phone], to notify of non-emergent EMS transport.  Auth notification reference given as [id number].   Service date range good for 90 days starting 04/17/16.   Geographical gap exception requested to determine if services can be considered at an in-network level.

## 2016-04-17 NOTE — Consult Note (Signed)
Reason for Consult:Difficulty with gait  Referring Physician: Poggi  CC: Difficulty with gait  HPI: Cheryl Mueller is an 75 y.o. female admitted for left shoulder surgery.  Reports that she has been having difficulty with ambulation for the past month.  Reports being fine one day and waking up the next with difficulty walking.  Reports that it was this difficulty walking that led to her fall. Has continued to have difficulty with gait since her fall.  Reports [facility] being [facility].  MRI was to be performed but has not yet been done.     Past Medical History:  Diagnosis Date  . Anxiety   . Arthritis   . Diabetes mellitus without complication (HCC)    Non Insulin dependant  . Headache    h/o migraines  . Hyperlipemia   . Hypertension   . Restless leg syndrome   . Stroke [facility](HCC) 2002   mini stroke  . TIA (transient ischemic attack)    approx 15 years ago  . Vertigo    hx of    Past Surgical History:  Procedure Laterality Date  . BACK SURGERY  2012   kyphoplasty lower back  . COLONOSCOPY W/ POLYPECTOMY    . DEBRIDEMENT OF ABDOMINAL WALL ABSCESS N/A 10/16/2015   Procedure: I & D OF ABDOMINAL WALL ABSCESS;  Surgeon: Lattie Hawichard E Cooper, MD;  Location: [facility] ORS;  Service: General;  Laterality: N/A;  . KYPHOPLASTY N/A 04/19/2015   Procedure: KYPHOPLASTY L 1;  Surgeon: Kennedy BuckerMichael Menz, MD;  Location: [facility] ORS;  Service: Orthopedics;  Laterality: N/A;  . KYPHOPLASTY N/A 02/27/2016   Procedure: KYPHOPLASTY;  Surgeon: Kennedy BuckerMichael Menz, MD;  Location: [facility] ORS;  Service: Orthopedics;  Laterality: N/A;  . LUMBAR LAMINECTOMY/DECOMPRESSION MICRODISCECTOMY Right 11/26/2012   Procedure: LUMBAR LAMINECTOMY/DECOMPRESSION MICRODISCECTOMY 1 LEVEL;  Surgeon: Hewitt Shortsobert W Nudelman, MD;  Location: [facility];  Service: Neurosurgery;  Laterality: Right;  Lumbar five-sacral one laminotomy and microdiskectomy   . ORIF PATELLA Left 03/23/2015   Procedure: OPEN REDUCTION INTERNAL (ORIF) FIXATION  PATELLA;  Surgeon: Kennedy BuckerMichael Menz, MD;  Location: [facility] ORS;  Service: Orthopedics;  Laterality: Left;  . REVERSE SHOULDER ARTHROPLASTY Left 04/16/2016   Procedure: REVERSE SHOULDER ARTHROPLASTY;  Surgeon: Christena FlakeJohn J Poggi, MD;  Location: [facility] ORS;  Service: Orthopedics;  Laterality: Left;    Family History  Problem Relation Age of Onset  . Stroke Mother   . Hypertension Mother   . Diabetes Father   . Breast cancer Neg Hx     Social History:  reports that she has never smoked. She has never used smokeless tobacco. She reports that she does not drink alcohol or use drugs.  Allergies  Allergen Reactions  . Alendronate Other (See Comments)    Reaction:  Leg pain   . Atorvastatin Rash  . Formaldehyde Rash    Medications:  I have reviewed the patient's current medications. Prior to Admission:  Prescriptions Prior to Admission  Medication Sig Dispense Refill Last Dose  . amLODipine (NORVASC) 10 MG tablet Take 10 mg by mouth daily.    04/16/2016 at 0600  . aspirin EC 81 MG tablet Take 81 mg by mouth daily.   04/15/2016 at 0800  . aspirin-acetaminophen-caffeine (EXCEDRIN MIGRAINE) 250-250-65 MG tablet Take 2 tablets by mouth every 8 (eight) hours as needed for headache.    04/15/2016 at 1200  . atorvastatin (LIPITOR) 80 MG tablet Take 80 mg by mouth at bedtime.   04/15/2016 at 2000  . Calcium  Carbonate-Vitamin D (CALCIUM 600+D) 600-400 MG-UNIT tablet Take 1 tablet by mouth 2 (two) times daily.   04/15/2016 at 0800  . diphenhydrAMINE (BENADRYL) 25 mg capsule Take 25 mg by mouth every 6 (six) hours as needed for itching.   Past Week at Unknown time  . glimepiride (AMARYL) 4 MG tablet Take 1 tablet (4 mg total) by mouth daily with breakfast. 30 tablet 11 04/15/2016 at 0800  . hydrochlorothiazide (MICROZIDE) 12.5 MG capsule Take 12.5 mg by mouth daily.    04/15/2016 at 0800  . HYDROcodone-acetaminophen (NORCO) 7.5-325 MG tablet Take 1 tablet by mouth every 6 (six) hours as needed for moderate pain  or severe pain. 20 tablet 0 04/15/2016 at 1200  . HYDROmorphone (DILAUDID) 2 MG tablet Take 1 tablet (2 mg total) by mouth every 12 (twelve) hours as needed for severe pain. 20 tablet 0 04/15/2016 at 1600  . hydrOXYzine (ATARAX/VISTARIL) 25 MG tablet Take 25 mg by mouth 3 (three) times daily as needed.   Past Week at Unknown time  . lactulose (CHRONULAC) 10 GM/15ML solution Take 20 g by mouth 2 (two) times daily as needed for mild constipation.   04/14/2016  . lidocaine (LIDODERM) 5 % Place 1 patch onto the skin every 12 (twelve) hours. Remove & Discard patch within 12 hours or as directed by MD 15 patch 0 Past Week at Unknown time  . lisinopril (PRINIVIL,ZESTRIL) 40 MG tablet Take 40 mg by mouth 2 (two) times daily.    04/16/2016 at 0600  . metFORMIN (GLUCOPHAGE) 1000 MG tablet Take 1,000 mg by mouth 2 (two) times daily with a meal.   04/15/2016 at 0800  . mometasone (ELOCON) 0.1 % cream Apply 1 application topically daily as needed (for itching).   04/15/2016 at 1200  . oxyCODONE-acetaminophen (ROXICET) 5-325 MG tablet Take 1 tablet by mouth every 6 (six) hours as needed for severe pain. 15 tablet 0 04/15/2016 at 0800  . potassium chloride (K-DUR,KLOR-CON) 10 MEQ tablet Take 1 tablet by mouth daily.   04/15/2016 at 0800  . rOPINIRole (REQUIP) 0.25 MG tablet Take 1 tablet (0.25 mg total) by mouth at bedtime. 30 tablet 1 04/14/2016 at 2100  . HYDROcodone-acetaminophen (NORCO) 7.5-325 MG tablet Take 1 tablet by mouth every 6 (six) hours as needed for moderate pain. (Patient not taking: Reported on 04/16/2016) 30 tablet 0 Not Taking at Unknown time  . Ibuprofen-Diphenhydramine Cit (IBUPROFEN PM) 200-38 MG TABS Take 2 tablets by mouth at bedtime as needed (for sleep).   04/14/2016 at 2200  . sitaGLIPtin (JANUVIA) 100 MG tablet Take 1 tablet by mouth daily.   Not Taking at Unknown time  . sulfamethoxazole-trimethoprim (BACTRIM DS,SEPTRA DS) 800-160 MG tablet Take 1 tablet by mouth 2 (two) times daily.  0  Not Taking at Unknown time  . traZODone (DESYREL) 50 MG tablet Take 0.5 tablets (25 mg total) by mouth at bedtime. (Patient not taking: Reported on 04/16/2016) 30 tablet 0 Not Taking at Unknown time   Scheduled: . amLODipine  10 mg Oral Daily  . aspirin EC  81 mg Oral Daily  . atorvastatin  80 mg Oral QHS  . calcium-vitamin D  1 tablet Oral BID  . docusate sodium  100 mg Oral BID  . enoxaparin (LOVENOX) injection  40 mg Subcutaneous Q24H  . glimepiride  4 mg Oral Q breakfast  . hydrochlorothiazide  12.5 mg Oral Daily  . insulin aspart  0-15 Units Subcutaneous TID WC  . lisinopril  40 mg Oral BID  .  metFORMIN  1,000 mg Oral BID WC  . pantoprazole  40 mg Oral Daily  . rOPINIRole  0.25 mg Oral QHS    ROS: History obtained from the patient  General ROS: negative for - chills, fatigue, fever, night sweats, weight gain or weight loss Psychological ROS: negative for - behavioral disorder, hallucinations, memory difficulties, mood swings or suicidal ideation Ophthalmic ROS: negative for - blurry vision, double vision, eye pain or loss of vision ENT ROS: negative for - epistaxis, nasal discharge, oral lesions, sore throat, tinnitus or vertigo Allergy and Immunology ROS: negative for - hives or itchy/watery eyes Hematological and Lymphatic ROS: negative for - bleeding problems, bruising or swollen lymph nodes Endocrine ROS: negative for - galactorrhea, hair pattern changes, polydipsia/polyuria or temperature intolerance Respiratory ROS: negative for - cough, hemoptysis, shortness of breath or wheezing Cardiovascular ROS: negative for - chest pain, dyspnea on exertion, edema or irregular heartbeat Gastrointestinal ROS: negative for - abdominal pain, diarrhea, hematemesis, nausea/vomiting or stool incontinence Genito-Urinary ROS: negative for - dysuria, hematuria, incontinence or urinary frequency/urgency Musculoskeletal ROS: shoulder pain Neurological ROS: as noted in HPI Dermatological ROS:  negative for rash and skin lesion changes  Physical Examination: Blood pressure (!) 190/77, pulse 82, temperature 98.7 F (37.1 C), temperature source Oral, resp. rate 18, height 4' 11.5" (1.511 m), weight 57.2 kg (126 lb), SpO2 96 %.  HEENT-  Normocephalic, no lesions, without obvious abnormality.  Normal external eye and conjunctiva.  Normal TM's bilaterally.  Normal auditory canals and external ears. Normal external nose, mucus membranes and septum.  Normal pharynx. Cardiovascular- S1, S2 normal, pulses palpable throughout   Lungs- chest clear, no wheezing, rales, normal symmetric air entry Abdomen- soft, non-tender; bowel sounds normal; no masses,  no organomegaly Extremities- no edema Lymph-no adenopathy palpable Musculoskeletal-no joint tenderness, deformity or swelling Skin-warm and dry, no hyperpigmentation, vitiligo, or suspicious lesions  Neurological Examination Mental Status: Alert, oriented, thought content appropriate.  Speech fluent without evidence of aphasia.  Able to follow 3 step commands without difficulty. Cranial Nerves: II: Discs flat bilaterally; Visual fields grossly normal, pupils equal, round, reactive to light and accommodation III,IV, VI: ptosis not present, extra-ocular motions intact bilaterally V,VII: decrease in right NLF, facial light touch sensation normal bilaterally VIII: hearing normal bilaterally IX,X: gag reflex present XI: bilateral shoulder shrug XII: midline tongue extension Motor: Right : Upper extremity   5/5    Left:     Upper extremity   Arm in splint  Lower extremity   5/5     Lower extremity   5/5 Tone and bulk:normal tone throughout; no atrophy noted Sensory: Pinprick and light touch intact excluding the LUE Deep Tendon Reflexes: 2+ in the RUE, 1+ at the knees and absent at the ankles.   Plantars: Right: mute   Left: mute Cerebellar: Normal finger-to-nose on the right and normal heel-to-shin testing bilaterally Gait: not tested due  to safety concerns    Laboratory Studies:   Basic Metabolic Panel:  Recent Labs Lab 04/15/16 1124 04/16/16 0702 04/17/16 0423  NA 136 139 138  K 3.1* 3.8 3.7  CL 99*  --  104  CO2 27  --  26  GLUCOSE 210* 198* 171*  BUN 14  --  9  CREATININE 0.59  --  0.45  CALCIUM 9.0  --  8.9    Liver Function Tests: No results for input(s): AST, ALT, ALKPHOS, BILITOT, PROT, ALBUMIN in the last 168 hours. No results for input(s): LIPASE, AMYLASE in the last  168 hours. No results for input(s): AMMONIA in the last 168 hours.  CBC:  Recent Labs Lab 04/15/16 1124 04/16/16 0702 04/17/16 0423  WBC 10.6  --  13.9*  NEUTROABS  --   --  12.5*  HGB 11.7* 11.9* 10.7*  HCT 34.2* 35.0* 32.3*  MCV 87.9  --  88.4  PLT 418  --  395    Cardiac Enzymes: No results for input(s): CKTOTAL, CKMB, CKMBINDEX, TROPONINI in the last 168 hours.  BNP: Invalid input(s): POCBNP  CBG:  Recent Labs Lab 04/16/16 1105 04/16/16 1609 04/16/16 2117 04/17/16 0056 04/17/16 0753  GLUCAP 173* 310* 232* 193* 170*    Microbiology: Results for orders placed or performed during the hospital encounter of 04/15/16  Surgical pcr screen     Status: Abnormal   Collection Time: 04/15/16 11:24 AM  Result Value Ref Range Status   MRSA, PCR POSITIVE (A) NEGATIVE Final    Comment: RESULT CALLED TO, READ BACK BY AND VERIFIED WITH: SHERRY FIELD ON 04/15/16 AT 1351 BY [facility]    Staphylococcus aureus POSITIVE (A) NEGATIVE Final    Comment:        The Xpert SA Assay (FDA approved for NASAL specimens in patients over 45 years of age), is one component of a comprehensive surveillance program.  Test performance has been validated by [facility] for patients greater than or equal to 47 year old. It is not intended to diagnose infection nor to guide or monitor treatment.   Urine culture     Status: Abnormal   Collection Time: 04/15/16 11:24 AM  Result Value Ref Range Status   Specimen Description URINE, RANDOM   Final   Special Requests NONE  Final   Culture 30,000 COLONIES/mL PROTEUS MIRABILIS (A)  Final   Report Status 04/17/2016 FINAL  Final   Organism ID, Bacteria PROTEUS MIRABILIS (A)  Final      Susceptibility   Proteus mirabilis - MIC*    AMPICILLIN <=2 SENSITIVE Sensitive     CEFAZOLIN <=4 SENSITIVE Sensitive     CEFTRIAXONE <=1 SENSITIVE Sensitive     CIPROFLOXACIN <=0.25 SENSITIVE Sensitive     GENTAMICIN <=1 SENSITIVE Sensitive     IMIPENEM 4 SENSITIVE Sensitive     NITROFURANTOIN 128 RESISTANT Resistant     TRIMETH/SULFA <=20 SENSITIVE Sensitive     AMPICILLIN/SULBACTAM <=2 SENSITIVE Sensitive     PIP/TAZO <=4 SENSITIVE Sensitive     * 30,000 COLONIES/mL PROTEUS MIRABILIS    Coagulation Studies:  Recent Labs  04/15/16 1124  LABPROT 12.3  INR 0.92    Urinalysis:  Recent Labs Lab 04/15/16 1124  COLORURINE YELLOW*  LABSPEC 1.023  PHURINE 6.0  GLUCOSEU 50*  HGBUR NEGATIVE  BILIRUBINUR NEGATIVE  KETONESUR NEGATIVE  PROTEINUR 30*  NITRITE NEGATIVE  LEUKOCYTESUR 3+*    Lipid Panel:  No results found for: CHOL, TRIG, HDL, CHOLHDL, VLDL, LDLCALC  HgbA1C:  Lab Results  Component Value Date   HGBA1C 7.9 (H) 10/13/2015    Urine Drug Screen:  No results found for: LABOPIA, COCAINSCRNUR, LABBENZ, AMPHETMU, THCU, LABBARB  Alcohol Level: No results for input(s): ETH in the last 168 hours.  Other results: EKG: sinus rhythm at 86 bpm with PAC's.  Imaging: Dg Shoulder Left Port  Result Date: 04/16/2016 CLINICAL DATA:  Post left shoulder replacement EXAM: LEFT SHOULDER - 1 VIEW COMPARISON:  CT 04/10/2016 FINDINGS: Changes of left shoulder replacement. Normal alignment. No visible hardware or bony complicating feature. IMPRESSION: Left shoulder replacement without visible complicating feature.  Electronically Signed   By: Charlett Nose M.D.   On: 04/16/2016 11:35     Assessment/Plan: 74 year old female with complaints of difficulty with gait of acute onset.  Acute  onset suggests possibility of acute cerebral infarct.  Has been evaluated by ortho.  No clear focality on neurological examination.  On ASA at home.  Further work up recommended.    Recommendations: 1.  MRI of the brain without contrast. 2.  To continue follow up with neurology on an outpatient basis.     Thana Farr, MD Neurology [phone] 04/17/2016, 2:59 PM

## 2016-04-17 NOTE — Progress Notes (Signed)
PT Cancellation Note  Patient Details Name: Cheryl Mueller MRN: [id number] DOB: November 29, 1940   Cancelled Treatment:    Reason Eval/Treat Not Completed: Other (comment). Treatment attempted; pt currently in wheelchair discharging. Complete current PT orders.    Scot DockHeidi E Qiana Landgrebe, PTA 04/17/2016, 10:45 AM

## 2016-04-18 LAB — SURGICAL PATHOLOGY

## 2016-04-24 ENCOUNTER — Encounter
Admission: RE | Admit: 2016-04-24 | Discharge: 2016-04-24 | Disposition: A | Discharge status: Home / Self Care | Payer: Medicare Other | Source: Ambulatory Visit | Attending: Internal Medicine | Admitting: Internal Medicine

## 2016-04-24 DIAGNOSIS — F329 Major depressive disorder, single episode, unspecified: Secondary | ICD-10-CM | POA: Insufficient documentation

## 2016-04-24 DIAGNOSIS — E119 Type 2 diabetes mellitus without complications: Secondary | ICD-10-CM | POA: Insufficient documentation

## 2016-04-25 DIAGNOSIS — E119 Type 2 diabetes mellitus without complications: Secondary | ICD-10-CM | POA: Diagnosis not present

## 2016-04-25 DIAGNOSIS — F329 Major depressive disorder, single episode, unspecified: Secondary | ICD-10-CM | POA: Diagnosis not present

## 2016-04-25 LAB — GLUCOSE, CAPILLARY
Glucose-Capillary: 90 mg/dL (ref 65–99)
Glucose-Capillary: 165 mg/dL — ABNORMAL HIGH (ref 65–99)
Glucose-Capillary: 144 mg/dL — ABNORMAL HIGH (ref 65–99)

## 2016-04-26 DIAGNOSIS — F329 Major depressive disorder, single episode, unspecified: Secondary | ICD-10-CM | POA: Diagnosis not present

## 2016-04-26 LAB — GLUCOSE, CAPILLARY
GLUCOSE-CAPILLARY: 93 mg/dL (ref 65–99)
Glucose-Capillary: 177 mg/dL — ABNORMAL HIGH (ref 65–99)
Glucose-Capillary: 192 mg/dL — ABNORMAL HIGH (ref 65–99)

## 2016-04-27 DIAGNOSIS — F329 Major depressive disorder, single episode, unspecified: Secondary | ICD-10-CM | POA: Diagnosis not present

## 2016-04-27 LAB — GLUCOSE, CAPILLARY
GLUCOSE-CAPILLARY: 122 mg/dL — AB (ref 65–99)
GLUCOSE-CAPILLARY: 100 mg/dL — AB (ref 65–99)
Glucose-Capillary: 85 mg/dL (ref 65–99)

## 2016-04-28 DIAGNOSIS — F329 Major depressive disorder, single episode, unspecified: Secondary | ICD-10-CM | POA: Diagnosis not present

## 2016-04-28 LAB — GLUCOSE, CAPILLARY
GLUCOSE-CAPILLARY: 86 mg/dL (ref 65–99)
Glucose-Capillary: 124 mg/dL — ABNORMAL HIGH (ref 65–99)
Glucose-Capillary: 101 mg/dL — ABNORMAL HIGH (ref 65–99)

## 2016-04-29 ENCOUNTER — Non-Acute Institutional Stay (SKILLED_NURSING_FACILITY): Payer: Medicare Other | Admitting: Gerontology

## 2016-04-29 DIAGNOSIS — F418 Other specified anxiety disorders: Secondary | ICD-10-CM | POA: Diagnosis not present

## 2016-04-29 DIAGNOSIS — F419 Anxiety disorder, unspecified: Principal | ICD-10-CM

## 2016-04-29 DIAGNOSIS — F329 Major depressive disorder, single episode, unspecified: Secondary | ICD-10-CM | POA: Diagnosis not present

## 2016-04-29 LAB — GLUCOSE, CAPILLARY
Glucose-Capillary: 168 mg/dL — ABNORMAL HIGH (ref 65–99)
Glucose-Capillary: 130 mg/dL — ABNORMAL HIGH (ref 65–99)
Glucose-Capillary: 121 mg/dL — ABNORMAL HIGH (ref 65–99)
Glucose-Capillary: 86 mg/dL (ref 65–99)

## 2016-04-30 DIAGNOSIS — F329 Major depressive disorder, single episode, unspecified: Secondary | ICD-10-CM | POA: Diagnosis not present

## 2016-04-30 LAB — GLUCOSE, CAPILLARY
GLUCOSE-CAPILLARY: 109 mg/dL — AB (ref 65–99)
GLUCOSE-CAPILLARY: 121 mg/dL — AB (ref 65–99)
GLUCOSE-CAPILLARY: 119 mg/dL — AB (ref 65–99)

## 2016-04-30 MED ORDER — NITROGLYCERIN IN D5W 200-5 MCG/ML-% IV SOLN
INTRAVENOUS | Status: AC
  Filled 2016-04-30: qty 250

## 2016-05-01 DIAGNOSIS — F329 Major depressive disorder, single episode, unspecified: Secondary | ICD-10-CM | POA: Diagnosis not present

## 2016-05-01 LAB — GLUCOSE, CAPILLARY
GLUCOSE-CAPILLARY: 87 mg/dL (ref 65–99)
GLUCOSE-CAPILLARY: 212 mg/dL — AB (ref 65–99)
Glucose-Capillary: 203 mg/dL — ABNORMAL HIGH (ref 65–99)
Glucose-Capillary: 158 mg/dL — ABNORMAL HIGH (ref 65–99)

## 2016-05-02 DIAGNOSIS — F329 Major depressive disorder, single episode, unspecified: Secondary | ICD-10-CM | POA: Diagnosis not present

## 2016-05-02 LAB — CBC WITH DIFFERENTIAL/PLATELET
BASOS ABS: 0.1 10*3/uL (ref 0–0.1)
BASOS PCT: 1 %
EOS ABS: 0.3 10*3/uL (ref 0–0.7)
EOS PCT: 3 %
HCT: 30.3 % — ABNORMAL LOW (ref 35.0–47.0)
Hemoglobin: 10.2 g/dL — ABNORMAL LOW (ref 12.0–16.0)
Lymphocytes Relative: 6 %
Lymphs Abs: 0.5 10*3/uL — ABNORMAL LOW (ref 1.0–3.6)
MCH: 29.4 pg (ref 26.0–34.0)
MCHC: 33.6 g/dL (ref 32.0–36.0)
MCV: 87.5 fL (ref 80.0–100.0)
MONO ABS: 0.9 10*3/uL (ref 0.2–0.9)
Monocytes Relative: 11 %
NEUTROS ABS: 6.5 10*3/uL (ref 1.4–6.5)
Neutrophils Relative %: 79 %
PLATELETS: 323 10*3/uL (ref 150–440)
RBC: 3.46 MIL/uL — ABNORMAL LOW (ref 3.80–5.20)
RDW: 13.5 % (ref 11.5–14.5)
WBC: 8.3 10*3/uL (ref 3.6–11.0)

## 2016-05-02 LAB — COMPREHENSIVE METABOLIC PANEL
ALBUMIN: 3 g/dL — AB (ref 3.5–5.0)
ALT: 11 U/L — ABNORMAL LOW (ref 14–54)
ANION GAP: 8 (ref 5–15)
AST: 18 U/L (ref 15–41)
Alkaline Phosphatase: 86 U/L (ref 38–126)
BUN: 19 mg/dL (ref 6–20)
CHLORIDE: 99 mmol/L — AB (ref 101–111)
CO2: 28 mmol/L (ref 22–32)
Calcium: 10 mg/dL (ref 8.9–10.3)
Creatinine, Ser: 0.59 mg/dL (ref 0.44–1.00)
GFR calc Af Amer: >60 mL/min (ref >60)
GFR calc non Af Amer: >60 mL/min (ref >60)
GLUCOSE: 74 mg/dL (ref 65–99)
POTASSIUM: 3.9 mmol/L (ref 3.5–5.1)
SODIUM: 135 mmol/L (ref 135–145)
Total Bilirubin: <0.1 mg/dL — ABNORMAL LOW (ref 0.3–1.2)
Total Protein: 6.2 g/dL — ABNORMAL LOW (ref 6.5–8.1)

## 2016-05-02 LAB — TSH: TSH: 0.88 u[IU]/mL (ref 0.350–4.500)

## 2016-05-02 LAB — GLUCOSE, CAPILLARY
GLUCOSE-CAPILLARY: 117 mg/dL — AB (ref 65–99)
GLUCOSE-CAPILLARY: 149 mg/dL — AB (ref 65–99)
Glucose-Capillary: 242 mg/dL — ABNORMAL HIGH (ref 65–99)

## 2016-05-02 LAB — MAGNESIUM: Magnesium: 1.4 mg/dL — ABNORMAL LOW (ref 1.7–2.4)

## 2016-05-02 LAB — VITAMIN B12: VITAMIN B 12: 179 pg/mL — AB (ref 180–914)

## 2016-05-03 DIAGNOSIS — F329 Major depressive disorder, single episode, unspecified: Secondary | ICD-10-CM | POA: Diagnosis not present

## 2016-05-03 LAB — GLUCOSE, CAPILLARY
GLUCOSE-CAPILLARY: 52 mg/dL — AB (ref 65–99)
GLUCOSE-CAPILLARY: 120 mg/dL — AB (ref 65–99)
Glucose-Capillary: 166 mg/dL — ABNORMAL HIGH (ref 65–99)
Glucose-Capillary: 101 mg/dL — ABNORMAL HIGH (ref 65–99)

## 2016-05-03 LAB — VITAMIN D 25 HYDROXY (VIT D DEFICIENCY, FRACTURES): Vit D, 25-Hydroxy: 23.8 ng/mL — ABNORMAL LOW (ref 30.0–100.0)

## 2016-05-04 DIAGNOSIS — F329 Major depressive disorder, single episode, unspecified: Secondary | ICD-10-CM | POA: Diagnosis not present

## 2016-05-04 LAB — GLUCOSE, CAPILLARY
GLUCOSE-CAPILLARY: 125 mg/dL — AB (ref 65–99)
Glucose-Capillary: 81 mg/dL (ref 65–99)
Glucose-Capillary: 147 mg/dL — ABNORMAL HIGH (ref 65–99)
Glucose-Capillary: 169 mg/dL — ABNORMAL HIGH (ref 65–99)

## 2016-05-05 DIAGNOSIS — F329 Major depressive disorder, single episode, unspecified: Secondary | ICD-10-CM | POA: Diagnosis not present

## 2016-05-05 LAB — GLUCOSE, CAPILLARY
GLUCOSE-CAPILLARY: 106 mg/dL — AB (ref 65–99)
GLUCOSE-CAPILLARY: 103 mg/dL — AB (ref 65–99)
Glucose-Capillary: 174 mg/dL — ABNORMAL HIGH (ref 65–99)

## 2016-05-06 DIAGNOSIS — F329 Major depressive disorder, single episode, unspecified: Secondary | ICD-10-CM | POA: Diagnosis not present

## 2016-05-06 LAB — GLUCOSE, CAPILLARY
GLUCOSE-CAPILLARY: 76 mg/dL (ref 65–99)
GLUCOSE-CAPILLARY: 145 mg/dL — AB (ref 65–99)
Glucose-Capillary: 139 mg/dL — ABNORMAL HIGH (ref 65–99)
Glucose-Capillary: 134 mg/dL — ABNORMAL HIGH (ref 65–99)

## 2016-05-07 DIAGNOSIS — F329 Major depressive disorder, single episode, unspecified: Secondary | ICD-10-CM | POA: Diagnosis not present

## 2016-05-07 LAB — GLUCOSE, CAPILLARY
GLUCOSE-CAPILLARY: 113 mg/dL — AB (ref 65–99)
GLUCOSE-CAPILLARY: 148 mg/dL — AB (ref 65–99)
GLUCOSE-CAPILLARY: 106 mg/dL — AB (ref 65–99)
Glucose-Capillary: 147 mg/dL — ABNORMAL HIGH (ref 65–99)

## 2016-05-08 DIAGNOSIS — F329 Major depressive disorder, single episode, unspecified: Secondary | ICD-10-CM | POA: Diagnosis not present

## 2016-05-08 LAB — GLUCOSE, CAPILLARY
GLUCOSE-CAPILLARY: 99 mg/dL (ref 65–99)
GLUCOSE-CAPILLARY: 159 mg/dL — AB (ref 65–99)
GLUCOSE-CAPILLARY: 105 mg/dL — AB (ref 65–99)
Glucose-Capillary: 62 mg/dL — ABNORMAL LOW (ref 65–99)

## 2016-05-09 DIAGNOSIS — F329 Major depressive disorder, single episode, unspecified: Secondary | ICD-10-CM | POA: Diagnosis not present

## 2016-05-09 LAB — GLUCOSE, CAPILLARY
GLUCOSE-CAPILLARY: 71 mg/dL (ref 65–99)
GLUCOSE-CAPILLARY: 151 mg/dL — AB (ref 65–99)
GLUCOSE-CAPILLARY: 58 mg/dL — AB (ref 65–99)
GLUCOSE-CAPILLARY: 165 mg/dL — AB (ref 65–99)

## 2016-05-10 DIAGNOSIS — F329 Major depressive disorder, single episode, unspecified: Secondary | ICD-10-CM | POA: Diagnosis not present

## 2016-05-10 LAB — GLUCOSE, CAPILLARY
GLUCOSE-CAPILLARY: 260 mg/dL — AB (ref 65–99)
Glucose-Capillary: 100 mg/dL — ABNORMAL HIGH (ref 65–99)
Glucose-Capillary: 69 mg/dL (ref 65–99)
Glucose-Capillary: 63 mg/dL — ABNORMAL LOW (ref 65–99)

## 2016-05-11 DIAGNOSIS — F329 Major depressive disorder, single episode, unspecified: Secondary | ICD-10-CM | POA: Diagnosis not present

## 2016-05-11 DIAGNOSIS — F419 Anxiety disorder, unspecified: Principal | ICD-10-CM

## 2016-05-11 DIAGNOSIS — F32A Depression, unspecified: Secondary | ICD-10-CM | POA: Insufficient documentation

## 2016-05-11 LAB — GLUCOSE, CAPILLARY
GLUCOSE-CAPILLARY: 128 mg/dL — AB (ref 65–99)
Glucose-Capillary: 67 mg/dL (ref 65–99)
Glucose-Capillary: 72 mg/dL (ref 65–99)
Glucose-Capillary: 98 mg/dL (ref 65–99)

## 2016-05-11 NOTE — Progress Notes (Signed)
75  Location:      Place of Service:  SNF (31) Provider:  Toni Arthurs, NP-C  [place] III, MD  Patient Care Team: Adin Hector, MD as PCP - General (Internal Medicine) Adin Hector, MD as Referring Physician (Internal Medicine) Robert Bellow, MD (General Surgery)  Extended Emergency Contact Information Primary Emergency Contact: [facility] Address: [address]          [place], [place] [address] Johnnette Litter of [place] Phone: [phone] Mobile Phone: [phone] Relation: Spouse  Code Status:  full Goals of care: Advanced Directive information Advanced Directives 04/16/2016  Does patient have an advance directive? Yes  Type of Advance Directive Living will  Does patient want to make changes to advanced directive? No - Patient declined  Copy of advanced directive(s) in chart? Yes  Would patient like information on creating an advanced directive? -     Chief Complaint  Patient presents with  . Acute Visit    HPI:  Pt is a 75 y.o. female seen today for an acute visit for new onset depression and anxiety. Pt made comments to therapy about wishing she were dead, etc. Comments were made this morning. Pt had a visit with her orthopedist afterwards. Upon questioning, pt denies comments but endorsed feeling down, hopeless and helpless. However, she reports she is not longer feeling that way since she went to the orthopedist and he lifted some of her restrictions/ changed the sling to increase mobility. Pt denies SI, plan, means. She does endorse anxiety r/t her condition. Staff is reporting she does not have a good support system at home. Pt agreeable to speaking with [facility]. Pt again denies SI and thanks me for checking in on her. No other complaints. Pain well controlled. VSS.    Past Medical History:  Diagnosis Date  . Anxiety   . Arthritis   . Diabetes mellitus without complication (HCC)    Non Insulin dependant  . Headache    h/o  migraines  . Hyperlipemia   . Hypertension   . Restless leg syndrome   . Stroke [facility]) 2002   mini stroke  . TIA (transient ischemic attack)    approx 15 years ago  . Vertigo    hx of   Past Surgical History:  Procedure Laterality Date  . BACK SURGERY  2012   kyphoplasty lower back  . COLONOSCOPY W/ POLYPECTOMY    . DEBRIDEMENT OF ABDOMINAL WALL ABSCESS N/A 10/16/2015   Procedure: I & D OF ABDOMINAL WALL ABSCESS;  Surgeon: Florene Glen, MD;  Location: [facility];  Service: General;  Laterality: N/A;  . KYPHOPLASTY N/A 04/19/2015   Procedure: KYPHOPLASTY L 1;  Surgeon: Hessie Knows, MD;  Location: [facility];  Service: Orthopedics;  Laterality: N/A;  . KYPHOPLASTY N/A 02/27/2016   Procedure: KYPHOPLASTY;  Surgeon: Hessie Knows, MD;  Location: [facility];  Service: Orthopedics;  Laterality: N/A;  . LUMBAR LAMINECTOMY/DECOMPRESSION MICRODISCECTOMY Right 11/26/2012   Procedure: LUMBAR LAMINECTOMY/DECOMPRESSION MICRODISCECTOMY 1 LEVEL;  Surgeon: Hosie Spangle, MD;  Location: [facility];  Service: Neurosurgery;  Laterality: Right;  Lumbar five-sacral one laminotomy and microdiskectomy   . ORIF PATELLA Left 03/23/2015   Procedure: OPEN REDUCTION INTERNAL (ORIF) FIXATION PATELLA;  Surgeon: Hessie Knows, MD;  Location: [facility];  Service: Orthopedics;  Laterality: Left;  . REVERSE SHOULDER ARTHROPLASTY Left 04/16/2016   Procedure: REVERSE SHOULDER ARTHROPLASTY;  Surgeon: Corky Mull, MD;  Location: [facility];  Service: Orthopedics;  Laterality: Left;    Allergies  Allergen Reactions  . Alendronate Other (See Comments)    Reaction:  Leg pain   . Atorvastatin Rash  . Formaldehyde Rash      Medication List       Accurate as of 04/29/16 11:59 PM. Always use your most recent med list.          amLODipine 10 MG tablet Commonly known as:  NORVASC Take 10 mg by mouth daily.   aspirin EC 81 MG tablet Take 81 mg by mouth daily.   aspirin-acetaminophen-caffeine 250-250-65 MG  tablet Commonly known as:  EXCEDRIN MIGRAINE Take 2 tablets by mouth every 8 (eight) hours as needed for headache.   atorvastatin 80 MG tablet Commonly known as:  LIPITOR Take 80 mg by mouth at bedtime.   CALCIUM 600+D 600-400 MG-UNIT tablet Generic drug:  Calcium Carbonate-Vitamin D Take 1 tablet by mouth 2 (two) times daily.   diphenhydrAMINE 25 mg capsule Commonly known as:  BENADRYL Take 25 mg by mouth every 6 (six) hours as needed for itching.   glimepiride 4 MG tablet Commonly known as:  AMARYL Take 1 tablet (4 mg total) by mouth daily with breakfast.   hydrochlorothiazide 12.5 MG capsule Commonly known as:  MICROZIDE Take 12.5 mg by mouth daily.   HYDROmorphone 2 MG tablet Commonly known as:  DILAUDID Take 1 tablet (2 mg total) by mouth every 12 (twelve) hours as needed for severe pain.   hydrOXYzine 25 MG tablet Commonly known as:  ATARAX/VISTARIL Take 25 mg by mouth 3 (three) times daily as needed.   IBUPROFEN PM 200-38 MG Tabs Generic drug:  Ibuprofen-Diphenhydramine Cit Take 2 tablets by mouth at bedtime as needed (for sleep).   lactulose 10 GM/15ML solution Commonly known as:  CHRONULAC Take 20 g by mouth 2 (two) times daily as needed for mild constipation.   lidocaine 5 % Commonly known as:  LIDODERM Place 1 patch onto the skin every 12 (twelve) hours. Remove & Discard patch within 12 hours or as directed by MD   lisinopril 40 MG tablet Commonly known as:  PRINIVIL,ZESTRIL Take 40 mg by mouth 2 (two) times daily.   metFORMIN 1000 MG tablet Commonly known as:  GLUCOPHAGE Take 1,000 mg by mouth 2 (two) times daily with a meal.   mometasone 0.1 % cream Commonly known as:  ELOCON Apply 1 application topically daily as needed (for itching).   oxyCODONE 5 MG immediate release tablet Commonly known as:  Oxy IR/ROXICODONE Take 1-2 tablets (5-10 mg total) by mouth every 4 (four) hours as needed for breakthrough pain.   potassium chloride 10 MEQ  tablet Commonly known as:  K-DUR,KLOR-CON Take 1 tablet by mouth daily.   rOPINIRole 0.25 MG tablet Commonly known as:  REQUIP Take 1 tablet (0.25 mg total) by mouth at bedtime.   sitaGLIPtin 100 MG tablet Commonly known as:  JANUVIA Take 1 tablet by mouth daily.   sulfamethoxazole-trimethoprim 800-160 MG tablet Commonly known as:  BACTRIM DS,SEPTRA DS Take 1 tablet by mouth 2 (two) times daily.   traZODone 50 MG tablet Commonly known as:  DESYREL Take 0.5 tablets (25 mg total) by mouth at bedtime.       Review of Systems  Constitutional: Negative for activity change, appetite change, chills, diaphoresis and fever.  HENT: Negative for congestion, sneezing, sore throat, trouble swallowing and voice change.   Eyes: Negative for pain, redness and visual disturbance.  Respiratory: Negative for apnea, cough, choking, chest tightness,  shortness of breath and wheezing.   Cardiovascular: Negative for chest pain, palpitations and leg swelling.  Gastrointestinal: Negative for abdominal distention, abdominal pain, constipation, diarrhea and nausea.  Genitourinary: Negative for difficulty urinating, dysuria, frequency and urgency.  Musculoskeletal: Positive for arthralgias (typical arthritis) and joint swelling. Negative for back pain, gait problem and myalgias.  Skin: Negative for color change, pallor, rash and wound.  Neurological: Negative for dizziness, tremors, syncope, speech difficulty, weakness, numbness and headaches.  Psychiatric/Behavioral: Positive for suicidal ideas. Negative for agitation and behavioral problems. The patient is nervous/anxious.   All other systems reviewed and are negative.    There is no immunization history on file for this patient. Pertinent  Health Maintenance Due  Topic Date Due  . FOOT EXAM  10/13/1950  . OPHTHALMOLOGY EXAM  [birth year]13/1950  . DEXA SCAN  10/14/2003  . PNA vac Low Risk Adult (1 of 2 - PCV13) 10/14/2003  . INFLUENZA VACCINE  01/23/2016   . HEMOGLOBIN A1C  04/13/2016   No flowsheet data found. Functional Status Survey:    Vitals:   04/29/16 0500  BP: 125/63  Pulse: 79  Resp: 17  Temp: 97.7 F (36.5 C)  SpO2: 96%  Weight: 127 lb 6.4 oz (57.8 kg)   Body mass index is 25.3 kg/m. Physical Exam  Constitutional: She is oriented to person, place, and time. Vital signs are normal. She appears well-developed and well-nourished. She is active and cooperative. She does not appear ill. No distress.  HENT:  Head: Normocephalic and atraumatic.  Mouth/Throat: Uvula is midline, oropharynx is clear and moist and mucous membranes are normal. Mucous membranes are not pale, not dry and not cyanotic.  Eyes: Conjunctivae, EOM and lids are normal. Pupils are equal, round, and reactive to light.  Neck: Trachea normal, normal range of motion and full passive range of motion without pain. Neck supple. No JVD present. No tracheal deviation, no edema and no erythema present. No thyromegaly present.  Cardiovascular: Normal rate, regular rhythm, normal heart sounds, intact distal pulses and normal pulses.  Exam reveals no gallop, no distant heart sounds and no friction rub.   No murmur heard. Pulses:      Radial pulses are 2+ on the right side, and 2+ on the left side.  Pulmonary/Chest: Effort normal and breath sounds normal. No accessory muscle usage. No respiratory distress. She has no decreased breath sounds. She has no wheezes. She has no rhonchi. She has no rales. She exhibits no tenderness.  Abdominal: Normal appearance and bowel sounds are normal. She exhibits no distension and no ascites. There is no tenderness.  Musculoskeletal: She exhibits no edema or tenderness.       Left shoulder: She exhibits decreased range of motion, swelling, laceration (incision) and decreased strength. She exhibits normal pulse.  Expected osteoarthritis, stiffness  Neurological: She is alert and oriented to person, place, and time. She has normal strength.   Skin: Skin is warm, dry and intact. She is not diaphoretic. No cyanosis. No pallor. Nails show no clubbing.  Psychiatric: Her speech is normal and behavior is normal. Judgment normal. Her mood appears anxious. Cognition and memory are normal. She expresses suicidal (PDW, but denies seriousness, plan, means. No intent) ideation.  Nursing note and vitals reviewed.   Labs reviewed:  Recent Labs  04/15/16 1124 04/16/16 0702 04/17/16 0423 05/02/16 0430  NA 136 139 138 135  K 3.1* 3.8 3.7 3.9  CL 99*  --  104 99*  CO2 27  --  26  28  GLUCOSE 210* 198* 171* 74  BUN 14  --  9 19  CREATININE 0.59  --  0.45 0.59  CALCIUM 9.0  --  8.9 10.0  MG  --   --   --  1.4*    Recent Labs  05/02/16 0430  AST 18  ALT 11*  ALKPHOS 86  BILITOT <0.1*  PROT 6.2*  ALBUMIN 3.0*    Recent Labs  04/15/16 1124 04/16/16 0702 04/17/16 0423 05/02/16 0430  WBC 10.6  --  13.9* 8.3  NEUTROABS  --   --  12.5* 6.5  HGB 11.7* 11.9* 10.7* 10.2*  HCT 34.2* 35.0* 32.3* 30.3*  MCV 87.9  --  88.4 87.5  PLT 418  --  395 323   Lab Results  Component Value Date   TSH 0.880 05/02/2016   Lab Results  Component Value Date   HGBA1C 7.9 (H) 10/13/2015   No results found for: CHOL, HDL, LDLCALC, LDLDIRECT, TRIG, CHOLHDL  Significant Diagnostic Results in last 30 days:  Dg Chest 2 View  Result Date: 04/15/2016 CLINICAL DATA:  Pt having pain in left side of chest. Hx of HTN, diabetes, non-smoker. No previous hx of any heart or lung conditions. Pt is having surgery on left humerus 04/16/16. EXAM: CHEST  2 VIEW COMPARISON:  11/26/2012 FINDINGS: Comminuted displaced fracture the proximal left humerus, which has been evaluated recently with radiographs in left shoulder CT. Cardiac silhouette is normal in size. No mediastinal or hilar masses. No evidence of adenopathy. Clear lungs.  No pleural effusion or pneumothorax. There are multiple vertebral compression fractures. Two contiguous fractures at the thoracolumbar  junction have been treated with vertebroplasty. IMPRESSION: 1. No acute cardiopulmonary disease. Electronically Signed   By: Lajean Manes M.D.   On: 04/15/2016 14:00   Dg Shoulder Left Port  Result Date: 04/16/2016 CLINICAL DATA:  Post left shoulder replacement EXAM: LEFT SHOULDER - 1 VIEW COMPARISON:  CT 04/10/2016 FINDINGS: Changes of left shoulder replacement. Normal alignment. No visible hardware or bony complicating feature. IMPRESSION: Left shoulder replacement without visible complicating feature. Electronically Signed   By: Rolm Baptise M.D.   On: 04/16/2016 11:35    Assessment/Plan 1. Anxiety and depression  Effexor 37.5 mg 1 cap po Qday x 7 days, then increase  Effexor 75 mg po Q day for depression/ anxiety  Labs  [facility]. evaluation  Family/ staff Communication:   Total Time:  Documentation:  Face to Face:  Family/Phone:   Labs/tests ordered:  Cbc, met c, tsh, mag, B12, D  Medication list reviewed and assessed for continued appropriateness.  Vikki Ports, NP-C Geriatrics [facility] [phone], [place] [address] Cell Phone (Mon-Fri 8am-5pm):  [phone] On Call:  [phone] & follow prompts after 5pm & weekends Office Phone:  [phone] Office Fax:  [phone]

## 2016-05-24 ENCOUNTER — Encounter: Admission: RE | Admit: 2016-05-24 | Payer: Medicare Other | Source: Ambulatory Visit | Admitting: Internal Medicine

## 2016-06-12 ENCOUNTER — Emergency Department
Admission: EM | Admit: 2016-06-12 | Discharge: 2016-06-12 | Disposition: A | Discharge status: Home / Self Care | Payer: Medicare Other | Attending: Emergency Medicine | Admitting: Emergency Medicine

## 2016-06-12 ENCOUNTER — Encounter: Payer: Self-pay | Admitting: Emergency Medicine

## 2016-06-12 ENCOUNTER — Emergency Department: Payer: Medicare Other

## 2016-06-12 DIAGNOSIS — I10 Essential (primary) hypertension: Secondary | ICD-10-CM | POA: Insufficient documentation

## 2016-06-12 DIAGNOSIS — S2242XA Multiple fractures of ribs, left side, initial encounter for closed fracture: Secondary | ICD-10-CM | POA: Insufficient documentation

## 2016-06-12 DIAGNOSIS — Z7984 Long term (current) use of oral hypoglycemic drugs: Secondary | ICD-10-CM | POA: Diagnosis not present

## 2016-06-12 DIAGNOSIS — E119 Type 2 diabetes mellitus without complications: Secondary | ICD-10-CM | POA: Diagnosis not present

## 2016-06-12 DIAGNOSIS — Z79899 Other long term (current) drug therapy: Secondary | ICD-10-CM | POA: Insufficient documentation

## 2016-06-12 DIAGNOSIS — S299XXA Unspecified injury of thorax, initial encounter: Secondary | ICD-10-CM | POA: Diagnosis present

## 2016-06-12 DIAGNOSIS — Y999 Unspecified external cause status: Secondary | ICD-10-CM | POA: Insufficient documentation

## 2016-06-12 DIAGNOSIS — Y939 Activity, unspecified: Secondary | ICD-10-CM | POA: Insufficient documentation

## 2016-06-12 DIAGNOSIS — W1800XA Striking against unspecified object with subsequent fall, initial encounter: Secondary | ICD-10-CM | POA: Diagnosis not present

## 2016-06-12 DIAGNOSIS — Y929 Unspecified place or not applicable: Secondary | ICD-10-CM | POA: Insufficient documentation

## 2016-06-12 DIAGNOSIS — Z7982 Long term (current) use of aspirin: Secondary | ICD-10-CM | POA: Insufficient documentation

## 2016-06-12 MED ORDER — DIPHENHYDRAMINE HCL 25 MG PO CAPS
25.0000 mg | ORAL_CAPSULE | Freq: Once | ORAL | Status: AC
  Administered 2016-06-12: 25 mg via ORAL
  Filled 2016-06-12: qty 1

## 2016-06-12 MED ORDER — KETOROLAC TROMETHAMINE 60 MG/2ML IM SOLN
30.0000 mg | Freq: Once | INTRAMUSCULAR | Status: AC
  Administered 2016-06-12: 30 mg via INTRAMUSCULAR
  Filled 2016-06-12: qty 2

## 2016-06-12 MED ORDER — OXYCODONE-ACETAMINOPHEN 5-325 MG PO TABS
2.0000 | ORAL_TABLET | Freq: Once | ORAL | Status: AC
  Administered 2016-06-12: 2 via ORAL
  Filled 2016-06-12: qty 2

## 2016-06-12 MED ORDER — OXYCODONE-ACETAMINOPHEN 5-325 MG PO TABS: 1.0000 | ORAL_TABLET | Freq: Four times a day (QID) | ORAL | 0 refills | Status: DC | PRN

## 2016-06-12 NOTE — Care Management Note (Signed)
Case Management Note  Patient Details  Name: Cheryl Mueller C Derner MRN: [id number] Date of Birth: 1941-01-08  Subjective/Objective:      E-mail from [place] with [facility] is saying the pt. Has had several falls this week They have PT, OT and     SN in with the patient. They say she is not compliant with home meds and has had high sugars    With this am over 300.      Action/Plan:   Expected Discharge Date:                  Expected Discharge Plan:     In-House Referral:     Discharge planning Services     Post Acute Care Choice:    Choice offered to:     DME Arranged:    DME Agency:     HH Arranged:    HH Agency:     Status of Service:     If discussed at [employer] Length of Stay Meetings, dates discussed:    Additional Comments:  Berna BueCheryl Milka Windholz, RN 06/12/2016, 1:28 PM

## 2016-06-12 NOTE — ED Triage Notes (Signed)
Per [facility], patient comes from home. Patient had a fall two days ago and hit her back. Patient c/o left rib cage pain. EMS states patient had an unsteady gate. 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 Patient denies hitting head during fall. A&O x4. GCS 15. CBG 257. VSS.

## 2016-06-12 NOTE — ED Notes (Signed)
Patient c/o itching   

## 2016-06-12 NOTE — ED Notes (Signed)
Carollee HerterShannon, RT called and notified of need of IS. States she will bring one down.

## 2016-06-12 NOTE — ED Notes (Signed)
Electronic signature pad not working at this time. Patient given discharge instructions. Patient verbalizes understanding. No further questions at this time. 

## 2016-06-12 NOTE — ED Provider Notes (Signed)
[facility] Emergency Department Provider Note  ____________________________________________   First MD Initiated Contact with Patient 06/12/16 1335     (approximate)  I have reviewed the triage vital signs and the nursing notes.   HISTORY  Chief Complaint Back Pain   HPI Cheryl Mueller is a 75 y.o. female with a history of TIA as well as hypertension who is presenting to the emergency department with left-sided back pain after fall 2 days ago. She says that she has poor balance and fell backward 2 days ago hitting her thoracic back. She says that the pain has been worsening despite taking 1 dose of oxycodone. She says that it has been difficult for her to walk because of the pain. Denies any cough. Denies hitting her head during the fall.   Past Medical History:  Diagnosis Date  . Anxiety   . Arthritis   . Diabetes mellitus without complication (HCC)    Non Insulin dependant  . Headache    h/o migraines  . Hyperlipemia   . Hypertension   . Restless leg syndrome   . Stroke [facility](HCC) 2002   mini stroke  . TIA (transient ischemic attack)    approx 15 years ago  . Vertigo    hx of    Patient Active Problem List   Diagnosis Date Noted  . Anxiety and depression 05/11/2016  . 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 BP (high blood pressure) 10/20/2015  . HLD (hyperlipidemia) 10/20/2015  . Abdominal wall cellulitis 10/13/2015  . Anemia, iron deficiency 05/08/2015  . Deficiency in the vitamin folic acid 05/08/2015  . Compression fracture of lumbar vertebra (HCC) 05/08/2015  . Acute kidney injury (HCC) 04/20/2015  . Compression fracture of L1 lumbar vertebra (HCC) 04/18/2015  . Acute blood loss anemia 04/18/2015  . Hypokalemia 04/18/2015  . Dehydration 04/18/2015  . Hematoma 04/17/2015  . Cellulitis 04/17/2015  . Benign hypertension 04/17/2015  . Diabetes mellitus (HCC)  04/17/2015  . Cystocele, midline 08/11/2014  . Abnormal presence of protein in urine 06/20/2014  . Type 2 diabetes mellitus with other diabetic kidney complication 12/14/2013    Past Surgical History:  Procedure Laterality Date  . BACK SURGERY  2012   kyphoplasty lower back  . COLONOSCOPY W/ POLYPECTOMY    . DEBRIDEMENT OF ABDOMINAL WALL ABSCESS N/A 10/16/2015   Procedure: I & D OF ABDOMINAL WALL ABSCESS;  Surgeon: Lattie Hawichard E Cooper, MD;  Location: [facility] ORS;  Service: General;  Laterality: N/A;  . KYPHOPLASTY N/A 04/19/2015   Procedure: KYPHOPLASTY L 1;  Surgeon: Kennedy BuckerMichael Menz, MD;  Location: [facility] ORS;  Service: Orthopedics;  Laterality: N/A;  . KYPHOPLASTY N/A 02/27/2016   Procedure: KYPHOPLASTY;  Surgeon: Kennedy BuckerMichael Menz, MD;  Location: [facility] ORS;  Service: Orthopedics;  Laterality: N/A;  . LUMBAR LAMINECTOMY/DECOMPRESSION MICRODISCECTOMY Right 11/26/2012   Procedure: LUMBAR LAMINECTOMY/DECOMPRESSION MICRODISCECTOMY 1 LEVEL;  Surgeon: Hewitt Shortsobert W Nudelman, MD;  Location: [facility];  Service: Neurosurgery;  Laterality: Right;  Lumbar five-sacral one laminotomy and microdiskectomy   . ORIF PATELLA Left 03/23/2015   Procedure: OPEN REDUCTION INTERNAL (ORIF) FIXATION PATELLA;  Surgeon: Kennedy BuckerMichael Menz, MD;  Location: [facility] ORS;  Service: Orthopedics;  Laterality: Left;  . REVERSE SHOULDER ARTHROPLASTY Left 04/16/2016   Procedure: REVERSE SHOULDER ARTHROPLASTY;  Surgeon: Christena FlakeJohn J Poggi, MD;  Location: [facility] ORS;  Service: Orthopedics;  Laterality: Left;    Prior to Admission medications   Medication Sig Start Date End Date Taking?  Authorizing Provider  amLODipine (NORVASC) 10 MG tablet Take 10 mg by mouth daily.     Historical Provider, MD  aspirin EC 81 MG tablet Take 81 mg by mouth daily.    Historical Provider, MD  aspirin-acetaminophen-caffeine (EXCEDRIN MIGRAINE) [phone] MG tablet Take 2 tablets by mouth every 8 (eight) hours as needed for headache.     Historical Provider, MD  atorvastatin  (LIPITOR) 80 MG tablet Take 80 mg by mouth at bedtime.    Historical Provider, MD  Calcium Carbonate-Vitamin D (CALCIUM 600+D) 600-400 MG-UNIT tablet Take 1 tablet by mouth 2 (two) times daily.    Historical Provider, MD  diphenhydrAMINE (BENADRYL) 25 mg capsule Take 25 mg by mouth every 6 (six) hours as needed for itching.    Historical Provider, MD  glimepiride (AMARYL) 4 MG tablet Take 1 tablet (4 mg total) by mouth daily with breakfast. 04/21/15   Curtis Sites III, MD  hydrochlorothiazide (MICROZIDE) 12.5 MG capsule Take 12.5 mg by mouth daily.     Historical Provider, MD  HYDROmorphone (DILAUDID) 2 MG tablet Take 1 tablet (2 mg total) by mouth every 12 (twelve) hours as needed for severe pain. 03/25/16   Jennye Moccasin, MD  hydrOXYzine (ATARAX/VISTARIL) 25 MG tablet Take 25 mg by mouth 3 (three) times daily as needed.    Historical Provider, MD  Ibuprofen-Diphenhydramine Cit (IBUPROFEN PM) 200-38 MG TABS Take 2 tablets by mouth at bedtime as needed (for sleep).    Historical Provider, MD  lactulose (CHRONULAC) 10 GM/15ML solution Take 20 g by mouth 2 (two) times daily as needed for mild constipation.    Historical Provider, MD  lidocaine (LIDODERM) 5 % Place 1 patch onto the skin every 12 (twelve) hours. Remove & Discard patch within 12 hours or as directed by MD 02/19/16   Sharman Cheek, MD  lisinopril (PRINIVIL,ZESTRIL) 40 MG tablet Take 40 mg by mouth 2 (two) times daily.     Historical Provider, MD  metFORMIN (GLUCOPHAGE) 1000 MG tablet Take 1,000 mg by mouth 2 (two) times daily with a meal.    Historical Provider, MD  mometasone (ELOCON) 0.1 % cream Apply 1 application topically daily as needed (for itching).    Historical Provider, MD  oxyCODONE (OXY IR/ROXICODONE) 5 MG immediate release tablet Take 1-2 tablets (5-10 mg total) by mouth every 4 (four) hours as needed for breakthrough pain. 04/17/16   Anson Oregon, PA-C  potassium chloride (K-DUR,KLOR-CON) 10 MEQ tablet Take 1 tablet  by mouth daily. 05/10/15   Historical Provider, MD  rOPINIRole (REQUIP) 0.25 MG tablet Take 1 tablet (0.25 mg total) by mouth at bedtime. 04/21/15   Curtis Sites III, MD  sitaGLIPtin (JANUVIA) 100 MG tablet Take 1 tablet by mouth daily. 09/05/15 09/04/16  Historical Provider, MD  sulfamethoxazole-trimethoprim (BACTRIM DS,SEPTRA DS) 800-160 MG tablet Take 1 tablet by mouth 2 (two) times daily. 10/18/15   Historical Provider, MD  traZODone (DESYREL) 50 MG tablet Take 0.5 tablets (25 mg total) by mouth at bedtime. Patient not taking: Reported on 04/16/2016 10/18/15   Enid Baas, MD    Allergies Alendronate; Atorvastatin; and Formaldehyde  Family History  Problem Relation Age of Onset  . Stroke Mother   . Hypertension Mother   . Diabetes Father   . Breast cancer Neg Hx     Social History Social History  Substance Use Topics  . Smoking status: Never Smoker  . Smokeless tobacco: Never Used  . Alcohol use No    Review  of Systems Constitutional: No fever/chills Eyes: No visual changes. ENT: No sore throat. Cardiovascular: Denies chest pain. Respiratory: Denies shortness of breath. Gastrointestinal: No abdominal pain.  No nausea, no vomiting.  No diarrhea.  No constipation. Genitourinary: Negative for dysuria. Musculoskeletal: As above Skin: Negative for rash. Neurological: Negative for headaches, focal weakness or numbness.  10-point ROS otherwise negative.  ____________________________________________   PHYSICAL EXAM:  VITAL SIGNS: ED Triage Vitals [06/12/16 1333]  Enc Vitals Group     BP (!) 154/75     Pulse Rate 85     Resp 17     Temp 97.9 F (36.6 C)     Temp src      SpO2 99 %     Weight 136 lb (61.7 kg)     Height 4\' 11"  (1.499 m)     Head Circumference      Peak Flow      Pain Score 8     Pain Loc      Pain Edu?      Excl. in GC?     Constitutional: Alert and oriented. Well appearing and in no acute distress. Eyes: Conjunctivae are normal. PERRL.  EOMI. Head: Atraumatic. Nose: No congestion/rhinnorhea. Mouth/Throat: Mucous membranes are moist.   Neck: No stridor.  The tenderness palpation of the midline cervical spine per no deformity or step-off. Cardiovascular: Normal rate, regular rhythm. Grossly normal heart sounds.  Respiratory: Normal respiratory effort.  No retractions. Lungs CTAB. Gastrointestinal: Soft and nontender. No distention. No CVA tenderness. Musculoskeletal: No lower extremity tenderness nor edema.  No joint effusions.  Tenderness palpation to the left lateral thoracic region posteriorly from ribs 8 through 10. No crepitus. No bruising/ecchymosis. Neurologic:  Normal speech and language. No gross focal neurologic deficits are appreciated.  Skin:  Skin is warm, dry and intact. No rash noted. Psychiatric: Mood and affect are normal. Speech and behavior are normal.  ____________________________________________   LABS (all labs ordered are listed, but only abnormal results are displayed)  Labs Reviewed - No data to display ____________________________________________  EKG   ____________________________________________  RADIOLOGY  CT Chest Wo Contrast (Final result)  Result time 06/12/16 14:36:40  Final result by Kennith Center, MD (06/12/16 14:36:40)           Narrative:   CLINICAL DATA: Patient fell 2 days ago and hit her back. Left rib cage pain.  EXAM: CT CHEST WITHOUT CONTRAST  TECHNIQUE: Multidetector CT imaging of the chest was performed following the standard protocol without IV contrast.  COMPARISON: None.  FINDINGS: Cardiovascular: The heart size is normal. No pericardial effusion. Coronary artery calcification is noted. Atherosclerotic calcification is noted in the wall of the thoracic aorta. Ascending thoracic aorta measures 3.9 cm maximum diameter.  Mediastinum/Nodes: No mediastinal lymphadenopathy. No evidence for gross hilar lymphadenopathy although assessment is limited by  the lack of intravenous contrast on today's study. Small hiatal hernia noted. Esophagus otherwise unremarkable. There is no axillary lymphadenopathy.  Lungs/Pleura: No focal airspace consolidation. No pulmonary edema or pleural effusion. Dependent atelectasis noted left lower lobe posteriorly with areas of apparent scarring in the right middle lobe, lingula, and peripheral lower lobes bilaterally.  Upper Abdomen: Unremarkable.  Musculoskeletal: Patient is status post left shoulder replacement. Acute fractures are identified in the posterior left tenth and eleventh ribs. T10 compression fracture was present on chest x-ray from 04/15/2016. Axial images show evidence of vertebral augmentation at L1.  IMPRESSION: 1. Acute fracture involving posterior left tenth and eleventh ribs. 2. T10 compression  fracture present on chest x-ray from 04/15/2016. 3. No acute cardiopulmonary findings.   Electronically Signed By: Kennith CenterEric Mansell M.D. On: 06/12/2016 14:36            ____________________________________________   PROCEDURES  Procedure(s) performed:   Procedures  Critical Care performed:   ____________________________________________   INITIAL IMPRESSION / ASSESSMENT AND PLAN / ED COURSE  Pertinent labs & imaging results that were available during my care of the patient were reviewed by me and considered in my medical decision making (see chart for details).   Clinical Course   Patient given 2 Percocets and still says she is having pain but would like to be discharged. I offered to give her Toradol but she is wanting to leave at this time. We also discussed incentive spirometry and to take 10 breaths, 6 times an hour off of the spirometer. She is understanding and willing to comply. She'll be given a Percocet prescription to go home with because she says she is out of her chronic pain medications. On review of her controlled substances reporting system she has had  multiple oxycodone as well as hydrocodone and hydromorphone prescriptions. The last was on 05/09/2006. I told her that I will only be giving her a 3 day supply of pain medications and that she would have to follow-up with her primary care doctor for further pain management. She has objective findings on her CAT scan of rib fracture.   ____________________________________________   FINAL CLINICAL IMPRESSION(S) / ED DIAGNOSES  Rib fractures.    NEW MEDICATIONS STARTED DURING THIS VISIT:  New Prescriptions   No medications on file     Note:  This document was prepared using Dragon voice recognition software and may include unintentional dictation errors.      Myrna Blazeravid Matthew Linnaea Ahn, MD 06/12/16 [phone]

## 2016-06-26 ENCOUNTER — Inpatient Hospital Stay
Admission: EM | Admit: 2016-06-26 | Discharge: 2016-07-03 | DRG: 854 | Disposition: A | Discharge status: Home / Self Care | Payer: Medicare Other | Attending: Internal Medicine | Admitting: Internal Medicine

## 2016-06-26 ENCOUNTER — Emergency Department: Payer: Medicare Other

## 2016-06-26 DIAGNOSIS — E11649 Type 2 diabetes mellitus with hypoglycemia without coma: Secondary | ICD-10-CM | POA: Diagnosis present

## 2016-06-26 DIAGNOSIS — Z888 Allergy status to other drugs, medicaments and biological substances status: Secondary | ICD-10-CM | POA: Diagnosis not present

## 2016-06-26 DIAGNOSIS — M1990 Unspecified osteoarthritis, unspecified site: Secondary | ICD-10-CM | POA: Diagnosis present

## 2016-06-26 DIAGNOSIS — Z8673 Personal history of transient ischemic attack (TIA), and cerebral infarction without residual deficits: Secondary | ICD-10-CM

## 2016-06-26 DIAGNOSIS — G2581 Restless legs syndrome: Secondary | ICD-10-CM | POA: Diagnosis present

## 2016-06-26 DIAGNOSIS — A4151 Sepsis due to Escherichia coli [E. coli]: Secondary | ICD-10-CM | POA: Diagnosis present

## 2016-06-26 DIAGNOSIS — F419 Anxiety disorder, unspecified: Secondary | ICD-10-CM | POA: Diagnosis present

## 2016-06-26 DIAGNOSIS — R21 Rash and other nonspecific skin eruption: Secondary | ICD-10-CM | POA: Diagnosis present

## 2016-06-26 DIAGNOSIS — Z8249 Family history of ischemic heart disease and other diseases of the circulatory system: Secondary | ICD-10-CM

## 2016-06-26 DIAGNOSIS — F329 Major depressive disorder, single episode, unspecified: Secondary | ICD-10-CM | POA: Diagnosis present

## 2016-06-26 DIAGNOSIS — L03312 Cellulitis of back [any part except buttock]: Secondary | ICD-10-CM

## 2016-06-26 DIAGNOSIS — B9689 Other specified bacterial agents as the cause of diseases classified elsewhere: Secondary | ICD-10-CM | POA: Diagnosis present

## 2016-06-26 DIAGNOSIS — R269 Unspecified abnormalities of gait and mobility: Secondary | ICD-10-CM | POA: Diagnosis present

## 2016-06-26 DIAGNOSIS — Z7401 Bed confinement status: Secondary | ICD-10-CM

## 2016-06-26 DIAGNOSIS — I10 Essential (primary) hypertension: Secondary | ICD-10-CM | POA: Diagnosis present

## 2016-06-26 DIAGNOSIS — L03114 Cellulitis of left upper limb: Secondary | ICD-10-CM | POA: Diagnosis present

## 2016-06-26 DIAGNOSIS — M4712 Other spondylosis with myelopathy, cervical region: Secondary | ICD-10-CM | POA: Diagnosis present

## 2016-06-26 DIAGNOSIS — Z823 Family history of stroke: Secondary | ICD-10-CM

## 2016-06-26 DIAGNOSIS — R531 Weakness: Secondary | ICD-10-CM | POA: Diagnosis present

## 2016-06-26 DIAGNOSIS — L02414 Cutaneous abscess of left upper limb: Secondary | ICD-10-CM | POA: Diagnosis present

## 2016-06-26 DIAGNOSIS — R262 Difficulty in walking, not elsewhere classified: Secondary | ICD-10-CM | POA: Diagnosis present

## 2016-06-26 DIAGNOSIS — B9561 Methicillin susceptible Staphylococcus aureus infection as the cause of diseases classified elsewhere: Secondary | ICD-10-CM | POA: Diagnosis present

## 2016-06-26 DIAGNOSIS — E876 Hypokalemia: Secondary | ICD-10-CM | POA: Diagnosis present

## 2016-06-26 DIAGNOSIS — Z7984 Long term (current) use of oral hypoglycemic drugs: Secondary | ICD-10-CM

## 2016-06-26 DIAGNOSIS — A419 Sepsis, unspecified organism: Secondary | ICD-10-CM

## 2016-06-26 DIAGNOSIS — Z91048 Other nonmedicinal substance allergy status: Secondary | ICD-10-CM

## 2016-06-26 DIAGNOSIS — E785 Hyperlipidemia, unspecified: Secondary | ICD-10-CM | POA: Diagnosis present

## 2016-06-26 DIAGNOSIS — R9082 White matter disease, unspecified: Secondary | ICD-10-CM | POA: Diagnosis present

## 2016-06-26 DIAGNOSIS — L03119 Cellulitis of unspecified part of limb: Secondary | ICD-10-CM

## 2016-06-26 DIAGNOSIS — L0390 Cellulitis, unspecified: Secondary | ICD-10-CM | POA: Diagnosis present

## 2016-06-26 DIAGNOSIS — M6281 Muscle weakness (generalized): Secondary | ICD-10-CM

## 2016-06-26 DIAGNOSIS — Z833 Family history of diabetes mellitus: Secondary | ICD-10-CM

## 2016-06-26 DIAGNOSIS — Z96612 Presence of left artificial shoulder joint: Secondary | ICD-10-CM

## 2016-06-26 DIAGNOSIS — G43909 Migraine, unspecified, not intractable, without status migrainosus: Secondary | ICD-10-CM | POA: Diagnosis present

## 2016-06-26 DIAGNOSIS — R32 Unspecified urinary incontinence: Secondary | ICD-10-CM | POA: Diagnosis present

## 2016-06-26 DIAGNOSIS — N3001 Acute cystitis with hematuria: Secondary | ICD-10-CM | POA: Diagnosis present

## 2016-06-26 DIAGNOSIS — Z79891 Long term (current) use of opiate analgesic: Secondary | ICD-10-CM

## 2016-06-26 DIAGNOSIS — Z791 Long term (current) use of non-steroidal anti-inflammatories (NSAID): Secondary | ICD-10-CM

## 2016-06-26 DIAGNOSIS — Z79899 Other long term (current) drug therapy: Secondary | ICD-10-CM

## 2016-06-26 DIAGNOSIS — Z981 Arthrodesis status: Secondary | ICD-10-CM

## 2016-06-26 HISTORY — DX: White matter disease, unspecified: R90.82

## 2016-06-26 HISTORY — DX: Disease of spinal cord, unspecified: G95.9

## 2016-06-26 LAB — CBC WITH DIFFERENTIAL/PLATELET
BASOS ABS: 0.1 10*3/uL (ref 0–0.1)
Basophils Relative: 1 %
EOS ABS: 0.1 10*3/uL (ref 0–0.7)
EOS PCT: 0 %
HCT: 30.5 % — ABNORMAL LOW (ref 35.0–47.0)
Hemoglobin: 10.2 g/dL — ABNORMAL LOW (ref 12.0–16.0)
LYMPHS PCT: 1 %
Lymphs Abs: 0.3 10*3/uL — ABNORMAL LOW (ref 1.0–3.6)
MCH: 29 pg (ref 26.0–34.0)
MCHC: 33.6 g/dL (ref 32.0–36.0)
MCV: 86.5 fL (ref 80.0–100.0)
MONO ABS: 1 10*3/uL — AB (ref 0.2–0.9)
Monocytes Relative: 4 %
Neutro Abs: 26.7 10*3/uL — ABNORMAL HIGH (ref 1.4–6.5)
Neutrophils Relative %: 94 %
PLATELETS: 575 10*3/uL — AB (ref 150–440)
RBC: 3.52 MIL/uL — ABNORMAL LOW (ref 3.80–5.20)
RDW: 13.7 % (ref 11.5–14.5)
WBC: 28.2 10*3/uL — AB (ref 3.6–11.0)

## 2016-06-26 LAB — URINALYSIS, ROUTINE W REFLEX MICROSCOPIC
BILIRUBIN URINE: NEGATIVE
GLUCOSE, UA: NEGATIVE mg/dL
Hgb urine dipstick: NEGATIVE
KETONES UR: 5 mg/dL — AB
NITRITE: NEGATIVE
PH: 5 (ref 5.0–8.0)
Protein, ur: 100 mg/dL — AB
Specific Gravity, Urine: 1.024 (ref 1.005–1.030)

## 2016-06-26 LAB — COMPREHENSIVE METABOLIC PANEL
ALBUMIN: 2.9 g/dL — AB (ref 3.5–5.0)
ALK PHOS: 132 U/L — AB (ref 38–126)
ALT: 26 U/L (ref 14–54)
AST: 56 U/L — AB (ref 15–41)
Anion gap: 15 (ref 5–15)
BUN: 29 mg/dL — AB (ref 6–20)
CALCIUM: 9.9 mg/dL (ref 8.9–10.3)
CO2: 23 mmol/L (ref 22–32)
CREATININE: 1.2 mg/dL — AB (ref 0.44–1.00)
Chloride: 93 mmol/L — ABNORMAL LOW (ref 101–111)
GFR calc Af Amer: 49 mL/min — ABNORMAL LOW (ref >60)
GFR, EST NON AFRICAN AMERICAN: 42 mL/min — AB (ref >60)
GLUCOSE: 242 mg/dL — AB (ref 65–99)
POTASSIUM: 3.3 mmol/L — AB (ref 3.5–5.1)
Sodium: 131 mmol/L — ABNORMAL LOW (ref 135–145)
TOTAL PROTEIN: 6.8 g/dL (ref 6.5–8.1)

## 2016-06-26 LAB — LACTIC ACID, PLASMA
LACTIC ACID, VENOUS: 3.5 mmol/L — AB (ref 0.5–1.9)
Lactic Acid, Venous: 2.2 mmol/L (ref 0.5–1.9)

## 2016-06-26 LAB — GLUCOSE, CAPILLARY: GLUCOSE-CAPILLARY: 92 mg/dL (ref 65–99)

## 2016-06-26 MED ORDER — ACETAMINOPHEN 500 MG PO TABS
1000.0000 mg | ORAL_TABLET | Freq: Once | ORAL | Status: AC
  Administered 2016-06-26: 1000 mg via ORAL
  Filled 2016-06-26: qty 2

## 2016-06-26 MED ORDER — VANCOMYCIN HCL 10 G IV SOLR
1500.0000 mg | Freq: Once | INTRAVENOUS | Status: AC
  Administered 2016-06-26: 1500 mg via INTRAVENOUS
  Filled 2016-06-26: qty 1500

## 2016-06-26 MED ORDER — ATORVASTATIN CALCIUM 20 MG PO TABS
80.0000 mg | ORAL_TABLET | Freq: Every day | ORAL | Status: DC
Start: 2016-06-26 — End: 2016-07-03
  Administered 2016-06-26 – 2016-07-02 (×7): 80 mg via ORAL
  Filled 2016-06-26 (×7): qty 4

## 2016-06-26 MED ORDER — PIPERACILLIN-TAZOBACTAM 3.375 G IVPB
3.3750 g | Freq: Three times a day (TID) | INTRAVENOUS | Status: DC
  Administered 2016-06-26 – 2016-07-02 (×16): 3.375 g via INTRAVENOUS
  Filled 2016-06-26 (×17): qty 50

## 2016-06-26 MED ORDER — MORPHINE SULFATE (PF) 2 MG/ML IV SOLN: 2.0000 mg | Freq: Once | INTRAVENOUS | Status: DC

## 2016-06-26 MED ORDER — TRAZODONE HCL 50 MG PO TABS
25.0000 mg | ORAL_TABLET | Freq: Every day | ORAL | Status: DC
  Administered 2016-06-26 – 2016-07-02 (×7): 25 mg via ORAL
  Filled 2016-06-26 (×4): qty 1
  Filled 2016-06-26: qty 2
  Filled 2016-06-26 (×2): qty 1

## 2016-06-26 MED ORDER — DIPHENHYDRAMINE HCL 25 MG PO CAPS
25.0000 mg | ORAL_CAPSULE | Freq: Four times a day (QID) | ORAL | Status: DC | PRN
  Administered 2016-06-28 – 2016-07-02 (×3): 25 mg via ORAL
  Filled 2016-06-26 (×3): qty 1

## 2016-06-26 MED ORDER — OXYCODONE-ACETAMINOPHEN 5-325 MG PO TABS
1.0000 | ORAL_TABLET | Freq: Four times a day (QID) | ORAL | Status: DC | PRN
  Administered 2016-06-26 – 2016-06-30 (×10): 1 via ORAL
  Administered 2016-06-30 – 2016-07-01 (×2): 2 via ORAL
  Filled 2016-06-26 (×6): qty 1
  Filled 2016-06-26: qty 2
  Filled 2016-06-26 (×4): qty 1
  Filled 2016-06-26: qty 2

## 2016-06-26 MED ORDER — VANCOMYCIN HCL 500 MG IV SOLR
500.0000 mg | INTRAVENOUS | Status: DC
  Filled 2016-06-26: qty 500

## 2016-06-26 MED ORDER — SODIUM CHLORIDE 0.9 % IV BOLUS (SEPSIS)
1000.0000 mL | Freq: Once | INTRAVENOUS | Status: AC
  Administered 2016-06-26: 1000 mL via INTRAVENOUS

## 2016-06-26 MED ORDER — METFORMIN HCL 500 MG PO TABS
1000.0000 mg | ORAL_TABLET | Freq: Two times a day (BID) | ORAL | Status: DC
  Administered 2016-06-27 – 2016-06-28 (×3): 1000 mg via ORAL
  Filled 2016-06-26 (×4): qty 2

## 2016-06-26 MED ORDER — AMLODIPINE BESYLATE 10 MG PO TABS
10.0000 mg | ORAL_TABLET | Freq: Every day | ORAL | Status: DC
  Administered 2016-06-27 – 2016-07-03 (×6): 10 mg via ORAL
  Filled 2016-06-26: qty 1
  Filled 2016-06-26: qty 2
  Filled 2016-06-26: qty 1
  Filled 2016-06-26 (×4): qty 2

## 2016-06-26 MED ORDER — HEPARIN SODIUM (PORCINE) 5000 UNIT/ML IJ SOLN
5000.0000 [IU] | Freq: Three times a day (TID) | INTRAMUSCULAR | Status: DC
  Administered 2016-06-26 – 2016-06-27 (×2): 5000 [IU] via SUBCUTANEOUS
  Filled 2016-06-26 (×2): qty 1

## 2016-06-26 MED ORDER — INSULIN ASPART 100 UNIT/ML ~~LOC~~ SOLN
0.0000 [IU] | Freq: Three times a day (TID) | SUBCUTANEOUS | Status: DC
  Administered 2016-06-27: 1 [IU] via SUBCUTANEOUS
  Administered 2016-06-27: 2 [IU] via SUBCUTANEOUS
  Filled 2016-06-26: qty 1
  Filled 2016-06-26: qty 2

## 2016-06-26 MED ORDER — OXYCODONE-ACETAMINOPHEN 5-325 MG PO TABS
ORAL_TABLET | ORAL | Status: AC
  Administered 2016-06-26: 1
  Filled 2016-06-26: qty 1

## 2016-06-26 MED ORDER — PIPERACILLIN-TAZOBACTAM 3.375 G IVPB 30 MIN
3.3750 g | Freq: Once | INTRAVENOUS | Status: AC
  Administered 2016-06-26: 3.375 g via INTRAVENOUS
  Filled 2016-06-26: qty 50

## 2016-06-26 MED ORDER — ASPIRIN-ACETAMINOPHEN-CAFFEINE 250-250-65 MG PO TABS
2.0000 | ORAL_TABLET | Freq: Three times a day (TID) | ORAL | Status: DC | PRN
  Filled 2016-06-26: qty 2

## 2016-06-26 MED ORDER — VENLAFAXINE HCL ER 75 MG PO CP24
75.0000 mg | ORAL_CAPSULE | Freq: Every day | ORAL | Status: DC
  Administered 2016-06-27 – 2016-07-03 (×6): 75 mg via ORAL
  Filled 2016-06-26 (×7): qty 1

## 2016-06-26 MED ORDER — GLIMEPIRIDE 4 MG PO TABS
4.0000 mg | ORAL_TABLET | Freq: Every day | ORAL | Status: DC
  Administered 2016-06-27 – 2016-06-28 (×2): 4 mg via ORAL
  Filled 2016-06-26 (×3): qty 1

## 2016-06-26 MED ORDER — SODIUM CHLORIDE 0.9 % IV BOLUS (SEPSIS)
1000.0000 mL | Freq: Once | INTRAVENOUS | Status: AC
Start: 2016-06-26 — End: 2016-06-26
  Administered 2016-06-26: 1000 mL via INTRAVENOUS

## 2016-06-26 MED ORDER — HYDROXYZINE HCL 25 MG PO TABS
25.0000 mg | ORAL_TABLET | Freq: Three times a day (TID) | ORAL | Status: DC | PRN
  Filled 2016-06-26: qty 1

## 2016-06-26 MED ORDER — LINAGLIPTIN 5 MG PO TABS
5.0000 mg | ORAL_TABLET | Freq: Every day | ORAL | Status: DC
  Administered 2016-06-27 – 2016-06-29 (×3): 5 mg via ORAL
  Filled 2016-06-26 (×2): qty 1

## 2016-06-26 MED ORDER — ROPINIROLE HCL 0.25 MG PO TABS
0.2500 mg | ORAL_TABLET | Freq: Every day | ORAL | Status: DC
  Administered 2016-06-26 – 2016-07-02 (×8): 0.25 mg via ORAL
  Filled 2016-06-26 (×8): qty 1

## 2016-06-26 MED ORDER — LIDOCAINE 5 % EX PTCH
1.0000 | MEDICATED_PATCH | Freq: Two times a day (BID) | CUTANEOUS | Status: DC
  Administered 2016-06-26 – 2016-07-02 (×12): 1 via TRANSDERMAL
  Filled 2016-06-26 (×16): qty 1

## 2016-06-26 MED ORDER — ONDANSETRON HCL 4 MG/2ML IJ SOLN
4.0000 mg | Freq: Once | INTRAMUSCULAR | Status: AC
  Administered 2016-06-26: 4 mg via INTRAVENOUS
  Filled 2016-06-26: qty 2

## 2016-06-26 NOTE — ED Notes (Signed)
Left for ultrasound

## 2016-06-26 NOTE — Consult Note (Signed)
Pharmacy Antibiotic Note  Cheryl Mueller is a 76 y.o. female admitted on 06/26/2016 with cellulitis.  Pharmacy has been consulted for vancomycin and zosyn dosing.  Plan: Pt received 1500mg  of vancomycin in the ED Vancomycin 500 IV every 24 hours.  Goal trough 10-15 mcg/mL. Zosyn 3.375g IV q8h (4 hour infusion). Trough prior to the 5th dose 1/7 @ 1730  Height: 4\' 11"  (149.9 cm) Weight: 136 lb (61.7 kg) IBW/kg (Calculated) : 43.2  Temp (24hrs), Avg:97.9 F (36.6 C), Min:97.9 F (36.6 C), Max:97.9 F (36.6 C)   Recent Labs Lab 06/26/16 1439 06/26/16 1548  WBC 28.2*  --   CREATININE 1.20*  --   LATICACIDVEN  --  3.5*    Estimated Creatinine Clearance: 31.4 mL/min (by C-G formula based on SCr of 1.2 mg/dL (H)).    Allergies  Allergen Reactions  . Alendronate Other (See Comments)    Reaction:  Leg pain   . Atorvastatin Rash  . Formaldehyde Rash    Antimicrobials this admission: vancomycin 1/2 >>  zosyn 1/2 >>   Dose adjustments this admission:   Microbiology results: 1/2 BCx:  1/2 UCx:     Thank you for allowing pharmacy to be a part of this patient's care.  Cheryl Mueller, Pharm.D, BCPS Clinical Pharmacist  06/26/2016 7:28 PM

## 2016-06-26 NOTE — ED Triage Notes (Addendum)
Pt arrives via POV c/o left arm pain and back pain both associated with reddened areas of possible skin infection per patient. Red, painful to touch. Top of left arm reddened down to elbow.

## 2016-06-26 NOTE — ED Notes (Signed)
Wound on left upper arm and back marked with skin marker.

## 2016-06-26 NOTE — H&P (Signed)
[facility] [place] at [facility]   PATIENT NAME: Cheryl Mueller    MR#:  [id number]  DATE OF BIRTH:  08-Oct-1940  DATE OF ADMISSION:  06/26/2016  PRIMARY CARE PHYSICIAN: Lynnea Ferrier, MD   REQUESTING/REFERRING PHYSICIAN: Sherrie Mustache  CHIEF COMPLAINT:   Chief Complaint  Patient presents with  . Wound Infection    HISTORY OF PRESENT ILLNESS: Royalty Fakhouri  is a 76 y.o. female with a known history of Anxiety, cervical myelopathy, diabetes, migraine headache, hypertension, restless leg syndrome, stroke, TIA- have gait disturbance and bladder incontinence for last few months and uses a walker or wheelchair. Was seen by [facility] neurology, MRI on the brain was done, awaiting further diagnoses but was told some kind of "white matter disease". For last few days she has pain in her left arm and back and noted to have a red swollen skiing in those areas so came to emergency room. Noted to have cellulitis and as she has diabetes she is given his admission to hospitalist team. She denies fever but she had chills.  PAST MEDICAL HISTORY:   Past Medical History:  Diagnosis Date  . Anxiety   . Arthritis   . Cervical myelopathy (HCC)   . Diabetes mellitus without complication (HCC)    Non Insulin dependant  . Headache    h/o migraines  . Hyperlipemia   . Hypertension   . Restless leg syndrome   . Stroke [facility]) 2002   mini stroke  . TIA (transient ischemic attack)    approx 15 years ago  . Vertigo    hx of    PAST SURGICAL HISTORY: Past Surgical History:  Procedure Laterality Date  . BACK SURGERY  2012   kyphoplasty lower back  . COLONOSCOPY W/ POLYPECTOMY    . DEBRIDEMENT OF ABDOMINAL WALL ABSCESS N/A 10/16/2015   Procedure: I & D OF ABDOMINAL WALL ABSCESS;  Surgeon: Lattie Haw, MD;  Location: [facility] ORS;  Service: General;  Laterality: N/A;  . KYPHOPLASTY N/A 04/19/2015   Procedure: KYPHOPLASTY L 1;  Surgeon: Kennedy Bucker, MD;  Location: [facility] ORS;  Service:  Orthopedics;  Laterality: N/A;  . KYPHOPLASTY N/A 02/27/2016   Procedure: KYPHOPLASTY;  Surgeon: Kennedy Bucker, MD;  Location: [facility] ORS;  Service: Orthopedics;  Laterality: N/A;  . LUMBAR LAMINECTOMY/DECOMPRESSION MICRODISCECTOMY Right 11/26/2012   Procedure: LUMBAR LAMINECTOMY/DECOMPRESSION MICRODISCECTOMY 1 LEVEL;  Surgeon: Hewitt Shorts, MD;  Location: [facility];  Service: Neurosurgery;  Laterality: Right;  Lumbar five-sacral one laminotomy and microdiskectomy   . ORIF PATELLA Left 03/23/2015   Procedure: OPEN REDUCTION INTERNAL (ORIF) FIXATION PATELLA;  Surgeon: Kennedy Bucker, MD;  Location: [facility] ORS;  Service: Orthopedics;  Laterality: Left;  . REVERSE SHOULDER ARTHROPLASTY Left 04/16/2016   Procedure: REVERSE SHOULDER ARTHROPLASTY;  Surgeon: Christena Flake, MD;  Location: [facility] ORS;  Service: Orthopedics;  Laterality: Left;    SOCIAL HISTORY:  Social History  Substance Use Topics  . Smoking status: Never Smoker  . Smokeless tobacco: Never Used  . Alcohol use No    FAMILY HISTORY:  Family History  Problem Relation Age of Onset  . Stroke Mother   . Hypertension Mother   . Diabetes Father   . Breast cancer Neg Hx     DRUG ALLERGIES:  Allergies  Allergen Reactions  . Alendronate Other (See Comments)    Reaction:  Leg pain   . Atorvastatin Rash  . Formaldehyde Rash    REVIEW OF SYSTEMS:   CONSTITUTIONAL: No  fever, fatigue or weakness.  EYES: No blurred or double vision.  EARS, NOSE, AND THROAT: No tinnitus or ear pain.  RESPIRATORY: No cough, shortness of breath, wheezing or hemoptysis.  CARDIOVASCULAR: No chest pain, orthopnea, edema.  GASTROINTESTINAL: No nausea, vomiting, diarrhea or abdominal pain.  GENITOURINARY: No dysuria, hematuria.  ENDOCRINE: No polyuria, nocturia,  HEMATOLOGY: No anemia, easy bruising or bleeding SKIN: No rash or lesion. MUSCULOSKELETAL: No joint pain or arthritis.   NEUROLOGIC: No tingling, numbness,Has lower extremity weakness.   PSYCHIATRY: No anxiety or depression.   MEDICATIONS AT HOME:  Prior to Admission medications   Medication Sig Start Date End Date Taking? Authorizing Provider  amLODipine (NORVASC) 10 MG tablet Take 10 mg by mouth daily.    Yes Historical Provider, MD  aspirin-acetaminophen-caffeine (EXCEDRIN MIGRAINE) [phone] MG tablet Take 2 tablets by mouth every 8 (eight) hours as needed for headache.    Yes Historical Provider, MD  atorvastatin (LIPITOR) 80 MG tablet Take 80 mg by mouth at bedtime.   Yes Historical Provider, MD  diphenhydrAMINE (BENADRYL) 25 mg capsule Take 25 mg by mouth every 6 (six) hours as needed for itching.   Yes Historical Provider, MD  glimepiride (AMARYL) 4 MG tablet Take 1 tablet (4 mg total) by mouth daily with breakfast. 04/21/15  Yes Curtis Sites III, MD  hydrochlorothiazide (MICROZIDE) 12.5 MG capsule Take 12.5 mg by mouth daily.    Yes Historical Provider, MD  HYDROmorphone (DILAUDID) 2 MG tablet Take 1 tablet (2 mg total) by mouth every 12 (twelve) hours as needed for severe pain. 03/25/16  Yes Jennye Moccasin, MD  hydrOXYzine (ATARAX/VISTARIL) 25 MG tablet Take 25 mg by mouth 3 (three) times daily as needed.   Yes Historical Provider, MD  Ibuprofen-Diphenhydramine Cit (IBUPROFEN PM) 200-38 MG TABS Take 2 tablets by mouth at bedtime as needed (for sleep).   Yes Historical Provider, MD  lisinopril (PRINIVIL,ZESTRIL) 40 MG tablet Take 40 mg by mouth daily.    Yes Historical Provider, MD  metFORMIN (GLUCOPHAGE) 1000 MG tablet Take 1,000 mg by mouth 2 (two) times daily with a meal.   Yes Historical Provider, MD  mometasone (ELOCON) 0.1 % cream Apply 1 application topically daily as needed (for itching).   Yes Historical Provider, MD  oxyCODONE (OXY IR/ROXICODONE) 5 MG immediate release tablet Take 1-2 tablets (5-10 mg total) by mouth every 4 (four) hours as needed for breakthrough pain. 04/17/16  Yes Anson Oregon, PA-C  oxyCODONE-acetaminophen (ROXICET) 5-325 MG tablet  Take 1-2 tablets by mouth every 6 (six) hours as needed. 06/12/16  Yes Myrna Blazer, MD  potassium chloride (K-DUR,KLOR-CON) 10 MEQ tablet Take 1 tablet by mouth daily. 05/10/15  Yes Historical Provider, MD  rOPINIRole (REQUIP) 0.25 MG tablet Take 1 tablet (0.25 mg total) by mouth at bedtime. 04/21/15  Yes Curtis Sites III, MD  sitaGLIPtin (JANUVIA) 100 MG tablet Take 1 tablet by mouth daily. 09/05/15 09/04/16 Yes Historical Provider, MD  traZODone (DESYREL) 50 MG tablet Take 0.5 tablets (25 mg total) by mouth at bedtime. 10/18/15  Yes Enid Baas, MD  venlafaxine XR (EFFEXOR-XR) 75 MG 24 hr capsule Take 1 capsule by mouth daily. 06/19/16  Yes Historical Provider, MD  lidocaine (LIDODERM) 5 % Place 1 patch onto the skin every 12 (twelve) hours. Remove & Discard patch within 12 hours or as directed by MD Patient not taking: Reported on 06/26/2016 02/19/16   Sharman Cheek, MD      PHYSICAL EXAMINATION:   VITAL  SIGNS: Blood pressure 123/72, pulse (!) 111, temperature 97.9 F (36.6 C), temperature source Oral, resp. rate 18, height 4\' 11"  (1.499 m), weight 61.7 kg (136 lb), SpO2 98 %.  GENERAL:  76 y.o.-year-old patient lying in the bed with no acute distress.  EYES: Pupils equal, round, reactive to light and accommodation. No scleral icterus. Extraocular muscles intact.  HEENT: Head atraumatic, normocephalic. Oropharynx and nasopharynx clear.  NECK:  Supple, no jugular venous distention. No thyroid enlargement, no tenderness.  LUNGS: Normal breath sounds bilaterally, no wheezing, rales,rhonchi or crepitation. No use of accessory muscles of respiration.  CARDIOVASCULAR: S1, S2 normal. No murmurs, rubs, or gallops.  ABDOMEN: Soft, nontender, nondistended. Bowel sounds present. No organomegaly or mass.  EXTREMITIES: No pedal edema, cyanosis, or clubbing.  NEUROLOGIC: Cranial nerves II through XII are intact. Muscle strength 4/5 in all extremities. Sensation intact. Gait not checked.   PSYCHIATRIC: The patient is alert and oriented x 3.  SKIN: On left arm lateral aspect and on lower back on the right side have ABIs of redness with thick and indurated skin. Some crusted papules seen in both of these areas.   LABORATORY PANEL:   CBC  Recent Labs Lab 06/26/16 1439  WBC 28.2*  HGB 10.2*  HCT 30.5*  PLT 575*  MCV 86.5  MCH 29.0  MCHC 33.6  RDW 13.7  LYMPHSABS 0.3*  MONOABS 1.0*  EOSABS 0.1  BASOSABS 0.1   ------------------------------------------------------------------------------------------------------------------  Chemistries   Recent Labs Lab 06/26/16 1439  NA 131*  K 3.3*  CL 93*  CO2 23  GLUCOSE 242*  BUN 29*  CREATININE 1.20*  CALCIUM 9.9  AST 56*  ALT 26  ALKPHOS 132*  BILITOT <0.1*   ------------------------------------------------------------------------------------------------------------------ estimated creatinine clearance is 31.4 mL/min (by C-G formula based on SCr of 1.2 mg/dL (H)). ------------------------------------------------------------------------------------------------------------------ No results for input(s): TSH, T4TOTAL, T3FREE, THYROIDAB in the last 72 hours.  Invalid input(s): FREET3   Coagulation profile No results for input(s): INR, PROTIME in the last 168 hours. ------------------------------------------------------------------------------------------------------------------- No results for input(s): DDIMER in the last 72 hours. -------------------------------------------------------------------------------------------------------------------  Cardiac Enzymes No results for input(s): CKMB, TROPONINI, MYOGLOBIN in the last 168 hours.  Invalid input(s): CK ------------------------------------------------------------------------------------------------------------------ Invalid input(s):  POCBNP  ---------------------------------------------------------------------------------------------------------------  Urinalysis    Component Value Date/Time   COLORURINE YELLOW (A) 04/15/2016 1124   APPEARANCEUR HAZY (A) 04/15/2016 1124   LABSPEC 1.023 04/15/2016 1124   PHURINE 6.0 04/15/2016 1124   GLUCOSEU 50 (A) 04/15/2016 1124   HGBUR NEGATIVE 04/15/2016 1124   BILIRUBINUR NEGATIVE 04/15/2016 1124   KETONESUR NEGATIVE 04/15/2016 1124   PROTEINUR 30 (A) 04/15/2016 1124   NITRITE NEGATIVE 04/15/2016 1124   LEUKOCYTESUR 3+ (A) 04/15/2016 1124     RADIOLOGY: [place] Extrem Up Left Ltd  Result Date: 06/26/2016 CLINICAL DATA:  Left arm cellulitis. EXAM: ULTRASOUND Left UPPER EXTREMITY LIMITED TECHNIQUE: Ultrasound examination of the upper extremity soft tissues was performed in the area of clinical concern. COMPARISON:  None FINDINGS: In the region of concern, there is a small amount of infiltrative edema in the subcutaneous tissues, for example on image 23. No abscess. No mass. IMPRESSION: 1. Mild infiltrative subcutaneous edema in the region of concern which could be a manifestation of cellulitis. No abscess or mass identified. Electronically Signed   By: Gaylyn RongWalter  Liebkemann M.D.   On: 06/26/2016 17:12    EKG: Orders placed or performed during the hospital encounter of 04/15/16  . EKG 12-Lead  . EKG 12-Lead    IMPRESSION AND PLAN:  *  Cellulitis   Vancomycin and Zosyn for now, check MRSA PCR.   Infectious disease consult.  * Diabetes   Continue home medicines, keep on insulin sliding scale coverage.  * Hypertension   Patient is on multiple hypertensive medications, blood pressure is currently running normal.   I will hold some of them and continue monitoring.  * Gait disturbance, weakness   As per her she has "some white matter problem".   She follows at [facility] neurology, I would advise to continue the same.   All the records are reviewed and case discussed with ED  provider. Management plans discussed with the patient, family and they are in agreement.  CODE STATUS: Full code. Code Status History    Date Active Date Inactive Code Status Order ID Comments User Context   04/16/2016 12:08 PM 04/17/2016  6:41 PM Full Code [id number]  Christena Flake, MD Inpatient   10/13/2015  5:09 PM 10/18/2015  2:35 PM Full Code [id number]  Enid Baas, MD Inpatient   04/17/2015  4:30 PM 04/21/2015  2:23 PM Full Code [id number]  Lynnea Ferrier, MD Inpatient   03/23/2015  2:27 PM 03/23/2015  5:50 PM Full Code [id number]  Kennedy Bucker, MD Inpatient   11/26/2012  4:41 PM 11/27/2012  1:02 PM Full Code [id number]  Hewitt Shorts, MD Inpatient     Patient's husband was present in the room during my visit.  TOTAL TIME TAKING CARE OF THIS PATIENT: 50 minutes.    Altamese Dilling M.D on 06/26/2016   Between 7am to 6pm - Pager - [phone]  After 6pm go to [email or web address] - password [email or web address]  [facility]  Office  [phone]  CC: Primary care physician; Lynnea Ferrier, MD   Note: This dictation was prepared with Dragon dictation along with smaller phrase technology. Any transcriptional errors that result from this process are unintentional.

## 2016-06-26 NOTE — ED Provider Notes (Signed)
[facility] Emergency Department Provider Note  ____________________________________________  Time seen: Approximately 4:54 PM  I have reviewed the triage vital signs and the nursing notes.   HISTORY  Chief Complaint Wound Infection   HPI Cheryl Mueller is a 75 y.o. female history of diabetes, hypertension, hyperlipidemia, and TIA who presents for evaluation of a cellulitis to her left upper extremity and back. Patient reports a few days of worsening redness and erythema located in the left arm and back. Patient has been seen by home health nurse who sent her over to the emergency room. She hasn't been on any antibiotics. She reports that her blood glucose has been in the 300s for the course of the last week. She denies fever or chills. She endorses severe nausea and a few episodes of nonbloody nonbilious emesis. Patient denies chest pain or shortness of breath, abdominal pain or diarrhea. She also complaining of pain which is 8 out of 10, throbbing, located in her arm and back, constant and nonradiating.  Past Medical History:  Diagnosis Date  . Anxiety   . Arthritis   . Cervical myelopathy (HCC)   . Diabetes mellitus without complication (HCC)    Non Insulin dependant  . Headache    h/o migraines  . Hyperlipemia   . Hypertension   . Restless leg syndrome   . Stroke [facility]) 2002   mini stroke  . TIA (transient ischemic attack)    approx 15 years ago  . Vertigo    hx of    Patient Active Problem List   Diagnosis Date Noted  . Anxiety and depression 05/11/2016  . 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 BP (high blood pressure) 10/20/2015  . HLD (hyperlipidemia) 10/20/2015  . Abdominal wall cellulitis 10/13/2015  . Anemia, iron deficiency 05/08/2015  . Deficiency in the vitamin folic acid 05/08/2015  . Compression fracture of lumbar vertebra (HCC) 05/08/2015  . Acute kidney  injury (HCC) 04/20/2015  . Compression fracture of L1 lumbar vertebra (HCC) 04/18/2015  . Acute blood loss anemia 04/18/2015  . Hypokalemia 04/18/2015  . Dehydration 04/18/2015  . Hematoma 04/17/2015  . Cellulitis 04/17/2015  . Benign hypertension 04/17/2015  . Diabetes mellitus (HCC) 04/17/2015  . Cystocele, midline 08/11/2014  . Abnormal presence of protein in urine 06/20/2014  . Type 2 diabetes mellitus with other diabetic kidney complication 12/14/2013    Past Surgical History:  Procedure Laterality Date  . BACK SURGERY  2012   kyphoplasty lower back  . COLONOSCOPY W/ POLYPECTOMY    . DEBRIDEMENT OF ABDOMINAL WALL ABSCESS N/A 10/16/2015   Procedure: I & D OF ABDOMINAL WALL ABSCESS;  Surgeon: Lattie Haw, MD;  Location: [facility] ORS;  Service: General;  Laterality: N/A;  . KYPHOPLASTY N/A 04/19/2015   Procedure: KYPHOPLASTY L 1;  Surgeon: Kennedy Bucker, MD;  Location: [facility] ORS;  Service: Orthopedics;  Laterality: N/A;  . KYPHOPLASTY N/A 02/27/2016   Procedure: KYPHOPLASTY;  Surgeon: Kennedy Bucker, MD;  Location: [facility] ORS;  Service: Orthopedics;  Laterality: N/A;  . LUMBAR LAMINECTOMY/DECOMPRESSION MICRODISCECTOMY Right 11/26/2012   Procedure: LUMBAR LAMINECTOMY/DECOMPRESSION MICRODISCECTOMY 1 LEVEL;  Surgeon: Hewitt Shorts, MD;  Location: [facility];  Service: Neurosurgery;  Laterality: Right;  Lumbar five-sacral one laminotomy and microdiskectomy   . ORIF PATELLA Left 03/23/2015   Procedure: OPEN REDUCTION INTERNAL (ORIF) FIXATION PATELLA;  Surgeon: Kennedy Bucker, MD;  Location: [facility] ORS;  Service: Orthopedics;  Laterality: Left;  .  REVERSE SHOULDER ARTHROPLASTY Left 04/16/2016   Procedure: REVERSE SHOULDER ARTHROPLASTY;  Surgeon: Christena Flake, MD;  Location: [facility] ORS;  Service: Orthopedics;  Laterality: Left;    Prior to Admission medications   Medication Sig Start Date End Date Taking? Authorizing Provider  amLODipine (NORVASC) 10 MG tablet Take 10 mg by mouth daily.    Yes  Historical Provider, MD  aspirin-acetaminophen-caffeine (EXCEDRIN MIGRAINE) [phone] MG tablet Take 2 tablets by mouth every 8 (eight) hours as needed for headache.    Yes Historical Provider, MD  atorvastatin (LIPITOR) 80 MG tablet Take 80 mg by mouth at bedtime.   Yes Historical Provider, MD  diphenhydrAMINE (BENADRYL) 25 mg capsule Take 25 mg by mouth every 6 (six) hours as needed for itching.   Yes Historical Provider, MD  glimepiride (AMARYL) 4 MG tablet Take 1 tablet (4 mg total) by mouth daily with breakfast. 04/21/15  Yes Curtis Sites III, MD  hydrochlorothiazide (MICROZIDE) 12.5 MG capsule Take 12.5 mg by mouth daily.    Yes Historical Provider, MD  HYDROmorphone (DILAUDID) 2 MG tablet Take 1 tablet (2 mg total) by mouth every 12 (twelve) hours as needed for severe pain. 03/25/16  Yes Jennye Moccasin, MD  hydrOXYzine (ATARAX/VISTARIL) 25 MG tablet Take 25 mg by mouth 3 (three) times daily as needed.   Yes Historical Provider, MD  Ibuprofen-Diphenhydramine Cit (IBUPROFEN PM) 200-38 MG TABS Take 2 tablets by mouth at bedtime as needed (for sleep).   Yes Historical Provider, MD  lisinopril (PRINIVIL,ZESTRIL) 40 MG tablet Take 40 mg by mouth daily.    Yes Historical Provider, MD  metFORMIN (GLUCOPHAGE) 1000 MG tablet Take 1,000 mg by mouth 2 (two) times daily with a meal.   Yes Historical Provider, MD  mometasone (ELOCON) 0.1 % cream Apply 1 application topically daily as needed (for itching).   Yes Historical Provider, MD  oxyCODONE (OXY IR/ROXICODONE) 5 MG immediate release tablet Take 1-2 tablets (5-10 mg total) by mouth every 4 (four) hours as needed for breakthrough pain. 04/17/16  Yes Anson Oregon, PA-C  oxyCODONE-acetaminophen (ROXICET) 5-325 MG tablet Take 1-2 tablets by mouth every 6 (six) hours as needed. 06/12/16  Yes Myrna Blazer, MD  potassium chloride (K-DUR,KLOR-CON) 10 MEQ tablet Take 1 tablet by mouth daily. 05/10/15  Yes Historical Provider, MD  rOPINIRole  (REQUIP) 0.25 MG tablet Take 1 tablet (0.25 mg total) by mouth at bedtime. 04/21/15  Yes Curtis Sites III, MD  sitaGLIPtin (JANUVIA) 100 MG tablet Take 1 tablet by mouth daily. 09/05/15 09/04/16 Yes Historical Provider, MD  traZODone (DESYREL) 50 MG tablet Take 0.5 tablets (25 mg total) by mouth at bedtime. 10/18/15  Yes Enid Baas, MD  venlafaxine XR (EFFEXOR-XR) 75 MG 24 hr capsule Take 1 capsule by mouth daily. 06/19/16  Yes Historical Provider, MD  lidocaine (LIDODERM) 5 % Place 1 patch onto the skin every 12 (twelve) hours. Remove & Discard patch within 12 hours or as directed by MD Patient not taking: Reported on 06/26/2016 02/19/16   Sharman Cheek, MD    Allergies Alendronate; Atorvastatin; and Formaldehyde  Family History  Problem Relation Age of Onset  . Stroke Mother   . Hypertension Mother   . Diabetes Father   . Breast cancer Neg Hx     Social History Social History  Substance Use Topics  . Smoking status: Never Smoker  . Smokeless tobacco: Never Used  . Alcohol use No    Review of Systems  Constitutional: Negative for  fever. Eyes: Negative for visual changes. ENT: Negative for sore throat. Neck: No neck pain  Cardiovascular: Negative for chest pain. Respiratory: Negative for shortness of breath. Gastrointestinal: Negative for abdominal pain, diarrhea. + N/V Genitourinary: Negative for dysuria. Musculoskeletal: Negative for back pain. Skin: + rash of LUE and lower back Neurological: Negative for headaches, weakness or numbness. Psych: No SI or HI  ____________________________________________   PHYSICAL EXAM:  VITAL SIGNS: ED Triage Vitals  Enc Vitals Group     BP 06/26/16 1436 130/62     Pulse Rate 06/26/16 1436 (!) 117     Resp 06/26/16 1436 16     Temp 06/26/16 1436 97.9 F (36.6 C)     Temp Source 06/26/16 1436 Oral     SpO2 06/26/16 1436 98 %     Weight 06/26/16 1436 136 lb (61.7 kg)     Height 06/26/16 1436 4\' 11"  (1.499 m)     Head  Circumference --      Peak Flow --      Pain Score 06/26/16 1437 8     Pain Loc --      Pain Edu? --      Excl. in GC? --     Constitutional: Alert and oriented. Well appearing and in no apparent distress. HEENT:      Head: Normocephalic and atraumatic.         Eyes: Conjunctivae are normal. Sclera is non-icteric. EOMI. PERRL      Mouth/Throat: Mucous membranes are moist.       Neck: Supple with no signs of meningismus. Cardiovascular: Tachycardic with regular rhythm. No murmurs, gallops, or rubs. 2+ symmetrical distal pulses are present in all extremities. No JVD. Respiratory: Normal respiratory effort. Lungs are clear to auscultation bilaterally. No wheezes, crackles, or rhonchi.  Gastrointestinal: Soft, non tender, and non distended with positive bowel sounds. No rebound or guarding. Musculoskeletal: Nontender with normal range of motion in all extremities. No edema, cyanosis, or erythema of extremities. Neurologic: Normal speech and language. Face is symmetric. Moving all extremities. No gross focal neurologic deficits are appreciated. Skin: Patient has induration with overlying erythema and warmth located in her left upper extremity and her left back. The back area has multiple excoriations.  Psychiatric: Mood and affect are normal. Speech and behavior are normal.  ____________________________________________   LABS (all labs ordered are listed, but only abnormal results are displayed)  Labs Reviewed  COMPREHENSIVE METABOLIC PANEL - Abnormal; Notable for the following:       Result Value   Sodium 131 (*)    Potassium 3.3 (*)    Chloride 93 (*)    Glucose, Bld 242 (*)    BUN 29 (*)    Creatinine, Ser 1.20 (*)    Albumin 2.9 (*)    AST 56 (*)    Alkaline Phosphatase 132 (*)    Total Bilirubin <0.1 (*)    GFR calc non Af Amer 42 (*)    GFR calc Af Amer 49 (*)    All other components within normal limits  CBC WITH DIFFERENTIAL/PLATELET - Abnormal; Notable for the  following:    WBC 28.2 (*)    RBC 3.52 (*)    Hemoglobin 10.2 (*)    HCT 30.5 (*)    Platelets 575 (*)    Neutro Abs 26.7 (*)    Lymphs Abs 0.3 (*)    Monocytes Absolute 1.0 (*)    All other components within normal limits  LACTIC ACID, PLASMA -  Abnormal; Notable for the following:    Lactic Acid, Venous 3.5 (*)    All other components within normal limits  LACTIC ACID, PLASMA - Abnormal; Notable for the following:    Lactic Acid, Venous 2.2 (*)    All other components within normal limits  URINALYSIS, ROUTINE W REFLEX MICROSCOPIC - Abnormal; Notable for the following:    Color, Urine AMBER (*)    APPearance CLOUDY (*)    Ketones, ur 5 (*)    Protein, ur 100 (*)    Leukocytes, UA MODERATE (*)    Bacteria, UA MANY (*)    Squamous Epithelial / LPF 0-5 (*)    All other components within normal limits  CULTURE, BLOOD (ROUTINE X 2)  CULTURE, BLOOD (ROUTINE X 2)  URINE CULTURE  MRSA PCR SCREENING   ____________________________________________  EKG  none ____________________________________________  RADIOLOGY  [place] arm: Mild infiltrative subcutaneous edema in the region of concern which could be a manifestation of cellulitis. No abscess or mass identified. ____________________________________________   PROCEDURES  Procedure(s) performed: None Procedures Critical Care performed: yes  CRITICAL CARE Performed by: Nita Sickle  ?  Total critical care time: 35 min  Critical care time was exclusive of separately billable procedures and treating other patients.  Critical care was necessary to treat or prevent imminent or life-threatening deterioration.  Critical care was time spent personally by me on the following activities: development of treatment plan with patient and/or surrogate as well as nursing, discussions with consultants, evaluation of patient's response to treatment, examination of patient, obtaining history from patient or surrogate, ordering and  performing treatments and interventions, ordering and review of laboratory studies, ordering and review of radiographic studies, pulse oximetry and re-evaluation of patient's condition.  ____________________________________________   INITIAL IMPRESSION / ASSESSMENT AND PLAN / ED COURSE   76 y.o. female history of diabetes, hypertension, hyperlipidemia, and TIA who presents for evaluation of a cellulitis to her left upper extremity and back. Patient has had elevated glucose at home, nausea and vomiting. Here she is tachycardic and afebrile. She has a large area of cellulitis in her left upper extremity with a large area of significant induration. I am unable to tell and examined patient has an abscess therefore will pursue an ultrasound. She also has cellulitis with excoriations located in her lower back. Her blood work shows a white count of 28,000 and a lactic acid of 3.5 therefore patient meets sepsis criteria. She'll be given IV vancomycin, zosyn, IVF and will be admitted to the Hospitalist service.  Clinical Course     Pertinent labs & imaging results that were available during my care of the patient were reviewed by me and considered in my medical decision making (see chart for details).    ____________________________________________   FINAL CLINICAL IMPRESSION(S) / ED DIAGNOSES  Final diagnoses:  Left arm cellulitis  Sepsis, due to unspecified organism (HCC)  Cellulitis of lower back      NEW MEDICATIONS STARTED DURING THIS VISIT:  New Prescriptions   No medications on file     Note:  This document was prepared using Dragon voice recognition software and may include unintentional dictation errors.    Nita Sickle, MD 06/26/16 2001

## 2016-06-27 LAB — GLUCOSE, CAPILLARY
GLUCOSE-CAPILLARY: 41 mg/dL — AB (ref 65–99)
GLUCOSE-CAPILLARY: 149 mg/dL — AB (ref 65–99)
Glucose-Capillary: 87 mg/dL (ref 65–99)
Glucose-Capillary: 149 mg/dL — ABNORMAL HIGH (ref 65–99)
Glucose-Capillary: 194 mg/dL — ABNORMAL HIGH (ref 65–99)
Glucose-Capillary: 260 mg/dL — ABNORMAL HIGH (ref 65–99)

## 2016-06-27 LAB — BASIC METABOLIC PANEL
Anion gap: 5 (ref 5–15)
BUN: 23 mg/dL — ABNORMAL HIGH (ref 6–20)
CHLORIDE: 106 mmol/L (ref 101–111)
CO2: 23 mmol/L (ref 22–32)
CREATININE: 0.73 mg/dL (ref 0.44–1.00)
Calcium: 8.2 mg/dL — ABNORMAL LOW (ref 8.9–10.3)
GFR calc non Af Amer: >60 mL/min (ref >60)
Glucose, Bld: 42 mg/dL — CL (ref 65–99)
Potassium: 3.2 mmol/L — ABNORMAL LOW (ref 3.5–5.1)
SODIUM: 134 mmol/L — AB (ref 135–145)

## 2016-06-27 LAB — CBC
HEMATOCRIT: 25.8 % — AB (ref 35.0–47.0)
Hemoglobin: 8.7 g/dL — ABNORMAL LOW (ref 12.0–16.0)
MCH: 29 pg (ref 26.0–34.0)
MCHC: 33.8 g/dL (ref 32.0–36.0)
MCV: 85.9 fL (ref 80.0–100.0)
PLATELETS: 430 10*3/uL (ref 150–440)
RBC: 3 MIL/uL — AB (ref 3.80–5.20)
RDW: 14 % (ref 11.5–14.5)
WBC: 21 10*3/uL — AB (ref 3.6–11.0)

## 2016-06-27 LAB — MRSA PCR SCREENING: MRSA by PCR: NEGATIVE

## 2016-06-27 MED ORDER — ENOXAPARIN SODIUM 40 MG/0.4ML ~~LOC~~ SOLN
40.0000 mg | SUBCUTANEOUS | Status: DC
  Administered 2016-06-27 – 2016-06-30 (×4): 40 mg via SUBCUTANEOUS
  Filled 2016-06-27 (×4): qty 0.4

## 2016-06-27 MED ORDER — ACETAMINOPHEN 325 MG PO TABS
650.0000 mg | ORAL_TABLET | Freq: Four times a day (QID) | ORAL | Status: DC | PRN
  Administered 2016-06-30: 650 mg via ORAL
  Filled 2016-06-27: qty 2

## 2016-06-27 MED ORDER — POTASSIUM CHLORIDE CRYS ER 10 MEQ PO TBCR
10.0000 meq | EXTENDED_RELEASE_TABLET | Freq: Every day | ORAL | Status: DC
  Administered 2016-06-27 – 2016-07-03 (×6): 10 meq via ORAL
  Filled 2016-06-27 (×7): qty 1

## 2016-06-27 MED ORDER — INSULIN ASPART 100 UNIT/ML ~~LOC~~ SOLN
0.0000 [IU] | Freq: Three times a day (TID) | SUBCUTANEOUS | Status: DC
  Administered 2016-06-27 – 2016-06-28 (×2): 5 [IU] via SUBCUTANEOUS
  Administered 2016-06-28: 3 [IU] via SUBCUTANEOUS
  Administered 2016-06-28: 5 [IU] via SUBCUTANEOUS
  Administered 2016-06-29 (×2): 3 [IU] via SUBCUTANEOUS
  Administered 2016-06-29: 2 [IU] via SUBCUTANEOUS
  Administered 2016-06-30: 3 [IU] via SUBCUTANEOUS
  Administered 2016-06-30: 5 [IU] via SUBCUTANEOUS
  Administered 2016-06-30: 3 [IU] via SUBCUTANEOUS
  Administered 2016-07-01: 7 [IU] via SUBCUTANEOUS
  Administered 2016-07-01 (×2): 1 [IU] via SUBCUTANEOUS
  Administered 2016-07-02: 7 [IU] via SUBCUTANEOUS
  Administered 2016-07-02: 1 [IU] via SUBCUTANEOUS
  Administered 2016-07-02: 3 [IU] via SUBCUTANEOUS
  Administered 2016-07-03: 4 [IU] via SUBCUTANEOUS
  Administered 2016-07-03: 1 [IU] via SUBCUTANEOUS
  Filled 2016-06-27: qty 7
  Filled 2016-06-27 (×3): qty 3
  Filled 2016-06-27: qty 2
  Filled 2016-06-27: qty 1
  Filled 2016-06-27: qty 2
  Filled 2016-06-27 (×2): qty 3
  Filled 2016-06-27 (×3): qty 5
  Filled 2016-06-27: qty 1
  Filled 2016-06-27: qty 5
  Filled 2016-06-27: qty 7
  Filled 2016-06-27 (×2): qty 1

## 2016-06-27 NOTE — NC FL2 (Signed)
[place] MEDICAID FL2 LEVEL OF CARE SCREENING TOOL     IDENTIFICATION  Patient Name: Cheryl Mueller Birthdate: 1940/10/13 Sex: female Admission Date (Current Location): 06/26/2016  [place]   Facility and Address:  [facility], [address], [place], [place] [address]      Provider Number: [id number]  Attending Physician Name and Address:  Houston SirenVivek J Sainani, MD  Relative Name and Phone Number:       Current Level of Care: Hospital Recommended Level of Care: Skilled Nursing Facility Prior Approval Number:    Date Approved/Denied:   PASRR Number:  [id number])  Discharge Plan: SNF    Current Diagnoses: Patient Active Problem List   Diagnosis Date Noted  . Anxiety and depression 05/11/2016  . 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 BP (high blood pressure) 10/20/2015  . HLD (hyperlipidemia) 10/20/2015  . Abdominal wall cellulitis 10/13/2015  . Anemia, iron deficiency 05/08/2015  . Deficiency in the vitamin folic acid 05/08/2015  . Compression fracture of lumbar vertebra (HCC) 05/08/2015  . Acute kidney injury (HCC) 04/20/2015  . Compression fracture of L1 lumbar vertebra (HCC) 04/18/2015  . Acute blood loss anemia 04/18/2015  . Hypokalemia 04/18/2015  . Dehydration 04/18/2015  . Hematoma 04/17/2015  . Cellulitis 04/17/2015  . Benign hypertension 04/17/2015  . Diabetes mellitus (HCC) 04/17/2015  . Cystocele, midline 08/11/2014  . Abnormal presence of protein in urine 06/20/2014  . Type 2 diabetes mellitus with other diabetic kidney complication 12/14/2013    Orientation RESPIRATION BLADDER Height & Weight     Self, Time, Situation, Place  O2 (2 Liters Oxygen ) Incontinent Weight: 123 lb 6.4 oz (56 kg) Height:  4\' 11"  (149.9 cm)  BEHAVIORAL SYMPTOMS/MOOD NEUROLOGICAL BOWEL NUTRITION STATUS   (none)  (none) Continent Diet (Diet: Heart  Healthy/ Carb Modified )  AMBULATORY STATUS COMMUNICATION OF NEEDS Skin   Extensive Assist Verbally Other (Comment) (cellulitis)                       Personal Care Assistance Level of Assistance  Bathing, Feeding, Dressing Bathing Assistance: Limited assistance Feeding assistance: Independent Dressing Assistance: Limited assistance     Functional Limitations Info  Sight, Hearing, Speech Sight Info: Adequate Hearing Info: Adequate Speech Info: Adequate    SPECIAL CARE FACTORS FREQUENCY  PT (By licensed PT), OT (By licensed OT)     PT Frequency:  (5) OT Frequency:  (5)            Contractures      Additional Factors Info  Code Status, Allergies, Isolation Precautions, Insulin Sliding Scale Code Status Info:  (Full Code. ) Allergies Info:  (Alendronate, Atorvastatin, Formaldehyde)   Insulin Sliding Scale Info:  (NovoLog Insulin ) Isolation Precautions Info:  (History of MRSA )     Current Medications (06/27/2016):  This is the current hospital active medication list Current Facility-Administered Medications  Medication Dose Route Frequency Provider Last Rate Last Dose  . acetaminophen (TYLENOL) tablet 650 mg  650 mg Oral Q6H PRN Houston SirenVivek J Sainani, MD      . amLODipine (NORVASC) tablet 10 mg  10 mg Oral Daily Altamese DillingVaibhavkumar Vachhani, MD   10 mg at 06/27/16 0916  . aspirin-acetaminophen-caffeine (EXCEDRIN MIGRAINE) per tablet 2 tablet  2 tablet Oral Q8H PRN Altamese DillingVaibhavkumar Vachhani, MD      . atorvastatin (LIPITOR) tablet 80 mg  80 mg Oral QHS Altamese DillingVaibhavkumar Vachhani,  MD   80 mg at 06/26/16 2140  . diphenhydrAMINE (BENADRYL) capsule 25 mg  25 mg Oral Q6H PRN Altamese Dilling, MD      . enoxaparin (LOVENOX) injection 40 mg  40 mg Subcutaneous Q24H Houston Siren, MD      . glimepiride (AMARYL) tablet 4 mg  4 mg Oral Q breakfast Altamese Dilling, MD   4 mg at 06/27/16 0916  . hydrOXYzine (ATARAX/VISTARIL) tablet 25 mg  25 mg Oral TID PRN Altamese Dilling, MD       . insulin aspart (novoLOG) injection 0-9 Units  0-9 Units Subcutaneous TID WC Altamese Dilling, MD   1 Units at 06/27/16 1220  . lidocaine (LIDODERM) 5 % 1 patch  1 patch Transdermal Q12H Altamese Dilling, MD   1 patch at 06/27/16 0916  . linagliptin (TRADJENTA) tablet 5 mg  5 mg Oral Daily Altamese Dilling, MD   5 mg at 06/27/16 1244  . metFORMIN (GLUCOPHAGE) tablet 1,000 mg  1,000 mg Oral BID WC Altamese Dilling, MD      . oxyCODONE-acetaminophen (PERCOCET/ROXICET) 5-325 MG per tablet 1-2 tablet  1-2 tablet Oral Q6H PRN Altamese Dilling, MD   1 tablet at 06/27/16 1222  . piperacillin-tazobactam (ZOSYN) IVPB 3.375 g  3.375 g Intravenous Q8H Melissa D Maccia, RPH   3.375 g at 06/27/16 0510  . potassium chloride (K-DUR,KLOR-CON) CR tablet 10 mEq  10 mEq Oral Daily Houston Siren, MD      . rOPINIRole (REQUIP) tablet 0.25 mg  0.25 mg Oral QHS Altamese Dilling, MD   0.25 mg at 06/26/16 2141  . traZODone (DESYREL) tablet 25 mg  25 mg Oral QHS Altamese Dilling, MD   25 mg at 06/26/16 2140  . venlafaxine XR (EFFEXOR-XR) 24 hr capsule 75 mg  75 mg Oral Daily Altamese Dilling, MD   75 mg at 06/27/16 1324     Discharge Medications: Please see discharge summary for a list of discharge medications.  Relevant Imaging Results:  Relevant Lab Results:   Additional Information  (SSN: [id number])  Fiora Weill, Darleen Crocker, LCSW

## 2016-06-27 NOTE — Progress Notes (Signed)
SNF and Non-Emergent EMS Transport Benefits:  Number called: [id number] Rep: Mia Reference Number: [id number]  [employer] Medicare Complete HMO Plan One active as of 06/24/16 with no deductible.  Out of pocket max is $4400, of which $0 met so far.  In-network SNF: $0 copay for days 1-20, a $160 daily copay for days 21-48, and a $0 copay for days 49-100.  Once out of pocket is reached, patient covered at 100% for remainder of 100 day benefit period. Per rep, their records show  32 days used in the current benefit period with last day used being 05/14/16 (discharged from SNF). Has not met 60 day renewal period.  $0 copay for professional fees and 3 day hospital stay is not required.  Josem Kaufmann is required: [phone].    Non-emergent EMS transport: $250 copay for each one way medically necessary, Medicare covered trip.  Josem Kaufmann is required: [phone].

## 2016-06-27 NOTE — Progress Notes (Signed)
New Admission Note:   Arrival Method: per stretcher from ED, pt came from home Mental Orientation: alert and oriented X4 Telemetry: placed on telebox [id number], CCMD notified, verified with Grover CanavanKrystal, NT Assessment: Completed Skin: warm, dry, with cellulitis and swelling noted on the left upper arm, skin marked from ED. With abrasions noted on the right knee and ankle, cleansed and dressed with foam, with scabs noted on the chest and mid lower back, reddened area on the mid back noted and marked from the ED. Prophylactic sacral foam applied. IV: G22 on the right forearm, G20 on the right AC both with transparent dressing, intact Pain: 8/10 scale on the lower back, will administer PRN pain medicine Safety Measures: Safety Fall Prevention Plan has been given and discussed Admission: Completed 1A Orientation: Patient has been oriented to the room, unit and staff.  Family: son at bedside  Orders have been reviewed and implemented. Will continue to monitor patient. Call light has been placed within reach and bed alarm has been activated.   Janice NorrieAnessa Pamalee Marcoe BSN, RN [facility] 1A

## 2016-06-27 NOTE — Progress Notes (Signed)
Pt with a fbs of 260 no night time insulin coverage. Spoke with Dr. Emmit PomfretHugelmeyer, Md to place orders.

## 2016-06-27 NOTE — Clinical Social Work Placement (Signed)
   CLINICAL SOCIAL WORK PLACEMENT  NOTE  Date:  06/27/2016  Patient Details  Name: Cheryl Mueller MRN: [id number] Date of Birth: 03-18-1941  Clinical Social Work is seeking post-discharge placement for this patient at the Skilled  Nursing Facility level of care (*CSW will initial, date and re-position this form in  chart as items are completed):  Yes   Patient/family provided with [facility] Clinical Social Work Department's list of facilities offering this level of care within the geographic area requested by the patient (or if unable, by the patient's family).  Yes   Patient/family informed of their freedom to choose among providers that offer the needed level of care, that participate in Medicare, Medicaid or managed care program needed by the patient, have an available bed and are willing to accept the patient.  Yes   Patient/family informed of [facility]'s ownership interest in [facility] and [facility], as well as of the fact that they are under no obligation to receive care at these facilities.  PASRR submitted to EDS on       PASRR number received on       Existing PASRR number confirmed on 06/27/16     FL2 transmitted to all facilities in geographic area requested by pt/family on 06/27/16     FL2 transmitted to all facilities within larger geographic area on       Patient informed that his/her managed care company has contracts with or will negotiate with certain facilities, including the following:            Patient/family informed of bed offers received.  Patient chooses bed at       Physician recommends and patient chooses bed at      Patient to be transferred to   on  .  Patient to be transferred to facility by       Patient family notified on   of transfer.  Name of family member notified:        PHYSICIAN       Additional Comment:    _______________________________________________ Lylia Karn, Darleen CrockerBailey M, LCSW 06/27/2016, 3:55 PM

## 2016-06-27 NOTE — Progress Notes (Signed)
CBG=87 rechecked at 0657 after pt had of orange juice. Pt more alert now.

## 2016-06-27 NOTE — Care Management (Signed)
Admitted with cellulitis. ID consult pending. Lives at home with children and uses a walker and wheelchair. Open to [facility] for SN/PT/OT. RNCM following

## 2016-06-27 NOTE — Progress Notes (Signed)
Physical Therapy Evaluation Patient Details Name: HAELEY FORDHAM MRN: [id number] DOB: 1941/05/31 Today's Date: 06/27/2016   History of Present Illness  Stavroula Rohde  is a 76 y.o. female with a known history of Anxiety, cervical myelopathy, diabetes, migraine headache, hypertension, restless leg syndrome, stroke, TIA- have gait disturbance and bladder incontinence for last few months and uses a walker or wheelchair. Was seen by [facility] neurology, MRI on the brain was done, awaiting further diagnoAnn Carbon  is a 76 y.o. female with a known history of Anxiety, cervical myelopathy, diabetes, migraine headache, hypertension, restless leg syndrome, stroke, TIA- have gait disturbance and bladder incontinence for last few months and uses a walker or wheelchair. Was seen by [facility] neurology, MRI on the brain was done, awaiting further diagnoses but was told some kind of "white matter disease".ses but was told some kind of "white matter disease".  Clinical Impression  Patient presents with decreased mobility and decreased strength of BLE and BUE and decreased ROM to LUE. She needs max assist for rolling and max assist for sit to supine and supine to sit bed mobility. She is not able to sit unsupported without max assist at the edge of the bed. Sitting static balance is poor. This patient presents with , 1, , personal factors/ comorbidities age  And 3  body elements including body structures and functions, activity limitations and or participation restrictions: decreased strength, decreased ROM, decreased bed mobility and decreased sitting balance. Patient's condition is  Evolving.    Follow Up Recommendations SNF    Equipment Recommendations       Recommendations for Other Services       Precautions / Restrictions Precautions Precautions: Fall Restrictions Weight Bearing Restrictions: No      Mobility  Bed Mobility Overal bed mobility: Needs Assistance Bed Mobility: Rolling;Supine to Sit;Sit to  Supine Rolling: Total assist   Supine to sit: Total assist Sit to supine: Total assist   General bed mobility comments: Patient needs max asssit and use of hospital bed  Transfers Overall transfer level:  (unable to perform)                  Ambulation/Gait Ambulation/Gait assistance:  (unable)              Stairs            Wheelchair Mobility    Modified Rankin (Stroke Patients Only)       Balance Overall balance assessment:  (poor sitting static balance)                                           Pertinent Vitals/Pain Pain Assessment: 0-10 Pain Score: 8  Pain Location: left shoulder and back Pain Descriptors / Indicators: Aching Pain Intervention(s): Limited activity within patient's tolerance    Home Living Family/patient expects to be discharged to:: Private residence Living Arrangements: Children Available Help at Discharge: Family   Home Access: Stairs to enter Entrance Stairs-Rails: Can reach both Entrance Stairs-Number of Steps: 4 Home Layout: Two level;Able to live on main level with bedroom/bathroom Home Equipment: Dan Humphreys - 2 wheels;Walker - 4 wheels;Cane - single point;Shower seat      Prior Function Level of Independence: Independent with assistive device(s)               Hand Dominance   Dominant Hand: Right    Extremity/Trunk Assessment  Upper Extremity Assessment Upper Extremity Assessment: Defer to OT evaluation    Lower Extremity Assessment Lower Extremity Assessment: Generalized weakness    Cervical / Trunk Assessment Cervical / Trunk Assessment: Normal  Communication   Communication: No difficulties  Cognition Arousal/Alertness: Awake/alert Behavior During Therapy: WFL for tasks assessed/performed Overall Cognitive Status: Within Functional Limits for tasks assessed                      General Comments      Exercises     Assessment/Plan    PT Assessment Patient  needs continued PT services  PT Problem List Decreased strength;Decreased range of motion;Decreased activity tolerance;Decreased balance;Decreased mobility;Decreased safety awareness;Pain          PT Treatment Interventions Therapeutic activities;Therapeutic exercise;Balance training;Neuromuscular re-education;Functional mobility training;Gait training    PT Goals (Current goals can be found in the Care Plan section)       Frequency Min 2X/week   Barriers to discharge        Co-evaluation               End of Session Equipment Utilized During Treatment: Oxygen Activity Tolerance: Patient limited by fatigue;Patient limited by lethargy;Patient limited by pain Patient left: in bed;with call bell/phone within reach;with bed alarm set           Time:  -      Charges:   PT Evaluation $PT Eval Moderate Complexity: 1 Procedure PT Treatments $Therapeutic Activity: 8-22 mins   PT G Codes:       Ezekiel InaKristine S Baya Lentz, PT, DPT [place], Barkley BrunsKristine S 06/27/2016, 3:42 PM

## 2016-06-27 NOTE — Progress Notes (Signed)
[facility] [place] at [facility]   PATIENT NAME: Cheryl Mueller    MR#:  [id number]  DATE OF BIRTH:  21-Dec-1940  SUBJECTIVE:   Pt. Here due to back pain and noted to have rash on her back and also on her left arm. Being treated for Cellulitis.  Husband at bedside.    REVIEW OF SYSTEMS:    Review of Systems  Constitutional: Negative for chills and fever.  HENT: Negative for congestion and tinnitus.   Eyes: Negative for blurred vision and double vision.  Respiratory: Negative for cough, shortness of breath and wheezing.   Cardiovascular: Negative for chest pain, orthopnea and PND.  Gastrointestinal: Negative for abdominal pain, diarrhea, nausea and vomiting.  Genitourinary: Negative for dysuria and hematuria.  Musculoskeletal: Positive for back pain.  Skin: Positive for rash.  Neurological: Negative for dizziness, sensory change and focal weakness.  All other systems reviewed and are negative.   Nutrition: Healthy/Carb control Tolerating Diet: Yes Tolerating PT: Await Eval.    DRUG ALLERGIES:   Allergies  Allergen Reactions  . Alendronate Other (See Comments)    Reaction:  Leg pain   . Atorvastatin Rash  . Formaldehyde Rash    VITALS:  Blood pressure (!) 116/55, pulse (!) 104, temperature 98 F (36.7 C), temperature source Oral, resp. rate 18, height 4\' 11"  (1.499 m), weight 56 kg (123 lb 6.4 oz), SpO2 95 %.  PHYSICAL EXAMINATION:   Physical Exam  GENERAL:  76 y.o.-year-old cachectic patient lying in the bed in mild distress  EYES: Pupils equal, round, reactive to light and accommodation. No scleral icterus. Extraocular muscles intact.  HEENT: Head atraumatic, normocephalic. Oropharynx and nasopharynx clear.  NECK:  Supple, no jugular venous distention. No thyroid enlargement, no tenderness.  LUNGS: Normal breath sounds bilaterally, no wheezing, rales, rhonchi. No use of accessory muscles of respiration.  CARDIOVASCULAR: S1, S2 normal. No murmurs,  rubs, or gallops.  ABDOMEN: Soft, nontender, nondistended. Bowel sounds present. No organomegaly or mass.  EXTREMITIES: No cyanosis, clubbing or edema b/l. Left upper ext. Swelling, redness fluctuance due to cellulitis.    NEUROLOGIC: Cranial nerves II through XII are intact. No focal Motor or sensory deficits b/l. Globally weak  PSYCHIATRIC: The patient is alert and oriented x 3.  Flat affect SKIN: Dry excoriating rash on the mid-back in dermatomal pattern but no vesicular lesions or draining noted.     LABORATORY PANEL:   CBC  Recent Labs Lab 06/27/16 0440  WBC 21.0*  HGB 8.7*  HCT 25.8*  PLT 430   ------------------------------------------------------------------------------------------------------------------  Chemistries   Recent Labs Lab 06/26/16 1439 06/27/16 0440  NA 131* 134*  K 3.3* 3.2*  CL 93* 106  CO2 23 23  GLUCOSE 242* 42*  BUN 29* 23*  CREATININE 1.20* 0.73  CALCIUM 9.9 8.2*  AST 56*  --   ALT 26  --   ALKPHOS 132*  --   BILITOT <0.1*  --    ------------------------------------------------------------------------------------------------------------------  Cardiac Enzymes No results for input(s): TROPONINI in the last 168 hours. ------------------------------------------------------------------------------------------------------------------  RADIOLOGY:  [place] Extrem Up Left Ltd  Result Date: 06/26/2016 CLINICAL DATA:  Left arm cellulitis. EXAM: ULTRASOUND Left UPPER EXTREMITY LIMITED TECHNIQUE: Ultrasound examination of the upper extremity soft tissues was performed in the area of clinical concern. COMPARISON:  None FINDINGS: In the region of concern, there is a small amount of infiltrative edema in the subcutaneous tissues, for example on image 23. No abscess. No mass. IMPRESSION: 1. Mild infiltrative subcutaneous edema  in the region of concern which could be a manifestation of cellulitis. No abscess or mass identified. Electronically Signed   By: Gaylyn RongWalter   Liebkemann M.D.   On: 06/26/2016 17:12     ASSESSMENT AND PLAN:   76 year old female with past medical history of obesity knees, hypertension, hyperlipidemia, history of previous CVA, restless leg syndrome, anxiety, osteoarthritis of presented to the hospital due to back pain and redness swelling and pain of the left upper extremity.  1. Left upper extremity cellulitis-continue IV Zosyn, will DC vancomycin as MRSA PCR negative. -Dopplers of the left upper extremity and negative for DVT. -Follow cultures, clinically improving.  2. Back pain with rash-vision noted to have a dry excoriating rash in a dermatomal pattern on the mid back. There is no acute vesicular areas. -Continue Lidoderm patch, supportive care. Await further infectious disease input, no active shingles lesion noted.  3. Generalized weakness-difficulty walking-secondary to deconditioning. - will get PT eval.   4. DM - cont. Amaryl, Metformin, SSI and follow BS - cont. Carb control diet.   5. Hyperlipidemia - cont. Atorvastatin.   6. Essential HTN - cont. Norvasc  7. Restless leg syndrome - cont. Requip.    All the records are reviewed and case discussed with Care Management/Social Worker. Management plans discussed with the patient, family and they are in agreement.  CODE STATUS: Full code  DVT Prophylaxis: Lovenox  TOTAL TIME TAKING CARE OF THIS PATIENT: 30 minutes.   POSSIBLE D/C IN 2-3 DAYS, DEPENDING ON CLINICAL CONDITION.   Houston SirenSAINANI,VIVEK J M.D on 06/27/2016 at 3:58 PM  Between 7am to 6pm - Pager - [phone]  After 6pm go to [email or web address] - Social research officer, governmentpassword [email or web address] [email or web address]  [employer] [place] [facility]  Office  [phone]  CC: Primary care physician; Lynnea FerrierBERT J KLEIN III, MD

## 2016-06-27 NOTE — Clinical Social Work Note (Signed)
Clinical Social Work Assessment  Patient Details  Name: Cheryl Mueller MRN: [id number] Date of Birth: 09/09/1940  Date of referral:  06/27/16               Reason for consult:  Facility Placement                Permission sought to share information with:  Chartered certified accountant granted to share information::  Yes, Verbal Permission Granted  Name::      [place]::   [facility]   Relationship::     Contact Information:     Housing/Transportation Living arrangements for the past 2 months:  [place], [place] of Information:  Patient Patient Interpreter Needed:  None Criminal Activity/Legal Involvement Pertinent to Current Situation/Hospitalization:  No - Comment as needed Significant Relationships:  Adult Children, Spouse Lives with:  Spouse Do you feel safe going back to the place where you live?  Yes Need for family participation in patient care:  Yes (Comment)  Care giving concerns:  Patient lives in [place] with her husband Cheryl Mueller.    Social Worker assessment / plan:  Holiday representative (CSW) received verbal consult from MD that patient will likely need SNF. PT is recommending SNF. CSW met with patient alone at bedside to address consult. Patient was alert and oriented and laying in the bed. CSW introduced self and explained role of CSW department. Patient reported that she lives in [place] with her husband. Per patient she was recently at [facility] in November 2017 and is agreeable to SNF search in [place]. Patient reported that she is in much worse shape now then Nov. 2017. Per insurance record patient used 32 days and is now in her co-pay days. CSW made patient aware that she will have a co-pay when she goes back to SNF. Patient verbalized her understanding. FL2 complete and faxed out. CSW will continue to follow and assist as needed.    Employment status:  Retired Programmer, applications PT Recommendations:  [place] / Referral to community resources:  [place]  Patient/Family's Response to care:  Patient is agreeable to [facility] in [place].   Patient/Family's Understanding of and Emotional Response to Diagnosis, Current Treatment, and Prognosis:  Patient was pleasant and thanked CSW for assistance.   Emotional Assessment Appearance:  Appears stated age Attitude/Demeanor/Rapport:    Affect (typically observed):  Accepting, Adaptable, Pleasant Orientation:  Oriented to Self, Oriented to Place, Oriented to  Time, Oriented to Situation Alcohol / Substance use:  Not Applicable Psych involvement (Current and /or in the community):  No (Comment)  Discharge Needs  Concerns to be addressed:  Discharge Planning Concerns Readmission within the last 30 days:  No Current discharge risk:  Dependent with Mobility Barriers to Discharge:  Continued Medical Work up   [employer], Veronia Beets, LCSW 06/27/2016, 3:56 PM

## 2016-06-27 NOTE — Progress Notes (Signed)
CRITICAL VALUE ALERT  Critical value received: Glucose=42  Date of notification: 06/27/16  Time of notification: 0629  Critical value read back:Yes.    Nurse who received alert:  Henrene DodgeAnessa M., RN  Intervention/Physician notified: hypoglycemic protocol follwed and implemented. Pt given 15g of carbohydrate snack

## 2016-06-27 NOTE — Progress Notes (Signed)
Inpatient Diabetes Program Recommendations  AACE/ADA: New Consensus Statement on Inpatient Glycemic Control (2015)  Target Ranges:  Prepandial:   less than 140 mg/dL      Peak postprandial:   less than 180 mg/dL (1-2 hours)      Critically ill patients:  140 - 180 mg/dL   Results for Damaris SchoonerBARFIELD, Jelitza C (MRN [id number]) as of 06/27/2016 08:49  Ref. Range 06/26/2016 21:27 06/27/2016 06:31 06/27/2016 06:57 06/27/2016 08:13  Glucose-Capillary Latest Ref Range: 65 - 99 mg/dL 92 41 (LL) 87 409149 (H)    Admit with: Cellulitis LUE and Back  History: DM, CVA  Home DM Meds: Amaryl 4 mg daily       Metformin 1000 mg BID       Januvia 100 mg daily  Current Insulin Orders: Amaryl 4 mg daily                 Metformin 1000 mg BID                 Tradjenta 5 mg daily      Novolog Sensitive Correction Scale/ SSI (0-9 units) TID AC      MD- Note patient with symptomatic Hypoglycemia this AM.  CBG down to 41 mg/dl.  Patient has not received any Insulin nor oral diabetes medications from the hospital yet since admission.    Hypoglycemic event could be due to oral diabetes medications still on board from home (pt took all three diabetes meds yesterday AM at home)  Please consider the following:  Stop Amaryl 4 mg daily for now (Metformin and Tradjenta have lower risk of Hypoglycemia)     --Will follow patient during hospitalization--  Ambrose FinlandJeannine Johnston Zykera Abella RN, MSN, CDE Diabetes Coordinator Inpatient Glycemic Control Team Team Pager: [phone] (8a-5p)

## 2016-06-28 ENCOUNTER — Inpatient Hospital Stay: Payer: Medicare Other

## 2016-06-28 LAB — SEDIMENTATION RATE: SED RATE: 106 mm/h — AB (ref 0–30)

## 2016-06-28 LAB — BASIC METABOLIC PANEL
ANION GAP: 6 (ref 5–15)
BUN: 24 mg/dL — ABNORMAL HIGH (ref 6–20)
CHLORIDE: 106 mmol/L (ref 101–111)
CO2: 24 mmol/L (ref 22–32)
Calcium: 7.8 mg/dL — ABNORMAL LOW (ref 8.9–10.3)
Creatinine, Ser: 0.86 mg/dL (ref 0.44–1.00)
GFR calc non Af Amer: >60 mL/min (ref >60)
Glucose, Bld: 124 mg/dL — ABNORMAL HIGH (ref 65–99)
POTASSIUM: 3.5 mmol/L (ref 3.5–5.1)
SODIUM: 136 mmol/L (ref 135–145)

## 2016-06-28 LAB — CBC
HCT: 24.9 % — ABNORMAL LOW (ref 35.0–47.0)
HEMOGLOBIN: 8.3 g/dL — AB (ref 12.0–16.0)
MCH: 28.8 pg (ref 26.0–34.0)
MCHC: 33.2 g/dL (ref 32.0–36.0)
MCV: 86.7 fL (ref 80.0–100.0)
Platelets: 413 10*3/uL (ref 150–440)
RBC: 2.88 MIL/uL — AB (ref 3.80–5.20)
RDW: 13.9 % (ref 11.5–14.5)
WBC: 17.3 10*3/uL — AB (ref 3.6–11.0)

## 2016-06-28 LAB — C-REACTIVE PROTEIN: CRP: 31 mg/dL — ABNORMAL HIGH (ref <1.0)

## 2016-06-28 LAB — GLUCOSE, CAPILLARY
GLUCOSE-CAPILLARY: 261 mg/dL — AB (ref 65–99)
GLUCOSE-CAPILLARY: 227 mg/dL — AB (ref 65–99)
GLUCOSE-CAPILLARY: 203 mg/dL — AB (ref 65–99)
Glucose-Capillary: 102 mg/dL — ABNORMAL HIGH (ref 65–99)

## 2016-06-28 MED ORDER — VANCOMYCIN HCL 500 MG IV SOLR
500.0000 mg | INTRAVENOUS | Status: DC
  Administered 2016-06-28 – 2016-07-02 (×5): 500 mg via INTRAVENOUS
  Filled 2016-06-28 (×6): qty 500

## 2016-06-28 NOTE — Consult Note (Signed)
ORTHOPAEDIC CONSULTATION  REQUESTING PHYSICIAN: Cheryl SirenVivek J Sainani, Cheryl Mueller  Chief Complaint:   Left upper arm pain.  History of Present Illness: Cheryl Mueller is a 76 y.o. female with multiple medical problems including diabetes, hypertension, vertigo, status post a CVA, hyperlipidemia, and anxiety who is now over 2.5 months status post a reverse left total shoulder replacement for a displaced three-part left proximal humerus fracture. The patient was doing well until about 3 days ago when she began to notice increased pain and swelling in the lateral aspect of her upper arm. She presented to the emergency room 2 days ago where she was noted to have an elevated white count and elevated blood glucose levels. Therefore, the patient was admitted for IV antibiotics and further workup. The patient notes moderate pain in the area, but denies any specific injury to the shoulder or to the upper arm. She also denies any fevers at home. Workup in the emergency room also demonstrated a positive urine culture.  Past Medical History:  Diagnosis Date  . Anxiety   . Arthritis   . Cervical myelopathy (HCC)   . Diabetes mellitus without complication (HCC)    Non Insulin dependant  . Headache    h/o migraines  . Hyperlipemia   . Hypertension   . Restless leg syndrome   . Stroke [facility](HCC) 2002   mini stroke  . TIA (transient ischemic attack)    approx 15 years ago  . Vertigo    hx of  . White matter disease    Past Surgical History:  Procedure Laterality Date  . BACK SURGERY  2012   kyphoplasty lower back  . COLONOSCOPY W/ POLYPECTOMY    . DEBRIDEMENT OF ABDOMINAL WALL ABSCESS N/A 10/16/2015   Procedure: I & D OF ABDOMINAL WALL ABSCESS;  Surgeon: Cheryl Hawichard E Cooper, Cheryl Mueller;  Location: [facility] ORS;  Service: General;  Laterality: N/A;  . KYPHOPLASTY N/A 04/19/2015   Procedure: KYPHOPLASTY L 1;  Surgeon: Cheryl BuckerMichael Menz, Cheryl Mueller;  Location: [facility] ORS;  Service:  Orthopedics;  Laterality: N/A;  . KYPHOPLASTY N/A 02/27/2016   Procedure: KYPHOPLASTY;  Surgeon: Cheryl BuckerMichael Menz, Cheryl Mueller;  Location: [facility] ORS;  Service: Orthopedics;  Laterality: N/A;  . LUMBAR LAMINECTOMY/DECOMPRESSION MICRODISCECTOMY Right 11/26/2012   Procedure: LUMBAR LAMINECTOMY/DECOMPRESSION MICRODISCECTOMY 1 LEVEL;  Surgeon: Cheryl Shortsobert W Nudelman, Cheryl Mueller;  Location: [facility];  Service: Neurosurgery;  Laterality: Right;  Lumbar five-sacral one laminotomy and microdiskectomy   . ORIF PATELLA Left 03/23/2015   Procedure: OPEN REDUCTION INTERNAL (ORIF) FIXATION PATELLA;  Surgeon: Cheryl BuckerMichael Menz, Cheryl Mueller;  Location: [facility] ORS;  Service: Orthopedics;  Laterality: Left;  . REVERSE SHOULDER ARTHROPLASTY Left 04/16/2016   Procedure: REVERSE SHOULDER ARTHROPLASTY;  Surgeon: Cheryl FlakeJohn J Saadia Dewitt, Cheryl Mueller;  Location: [facility] ORS;  Service: Orthopedics;  Laterality: Left;   Social History   Social History  . Marital status: Married    Spouse name: N/A  . Number of children: N/A  . Years of education: N/A   Social History Main Topics  . Smoking status: Never Smoker  . Smokeless tobacco: Never Used  . Alcohol use No  . Drug use: No  . Sexual activity: No   Other Topics Concern  . None   Social History Narrative  . None   Family History  Problem Relation Age of Onset  . Stroke Mother   . Hypertension Mother   . Diabetes Father   . Breast cancer Neg Hx    Allergies  Allergen Reactions  . Alendronate Other (See Comments)  Reaction:  Leg pain   . Atorvastatin Rash  . Formaldehyde Rash   Prior to Admission medications   Medication Sig Start Date End Date Taking? Authorizing Provider  amLODipine (NORVASC) 10 MG tablet Take 10 mg by mouth daily.    Yes Historical Provider, Cheryl Mueller  aspirin-acetaminophen-caffeine (EXCEDRIN MIGRAINE) [phone] MG tablet Take 2 tablets by mouth every 8 (eight) hours as needed for headache.    Yes Historical Provider, Cheryl Mueller  atorvastatin (LIPITOR) 80 MG tablet Take 80 mg by mouth at bedtime.    Yes Historical Provider, Cheryl Mueller  diphenhydrAMINE (BENADRYL) 25 mg capsule Take 25 mg by mouth every 6 (six) hours as needed for itching.   Yes Historical Provider, Cheryl Mueller  glimepiride (AMARYL) 4 MG tablet Take 1 tablet (4 mg total) by mouth daily with breakfast. 04/21/15  Yes Cheryl Sites III, Cheryl Mueller  hydrochlorothiazide (MICROZIDE) 12.5 MG capsule Take 12.5 mg by mouth daily.    Yes Historical Provider, Cheryl Mueller  HYDROmorphone (DILAUDID) 2 MG tablet Take 1 tablet (2 mg total) by mouth every 12 (twelve) hours as needed for severe pain. 03/25/16  Yes Cheryl Moccasin, Cheryl Mueller  hydrOXYzine (ATARAX/VISTARIL) 25 MG tablet Take 25 mg by mouth 3 (three) times daily as needed.   Yes Historical Provider, Cheryl Mueller  Ibuprofen-Diphenhydramine Cit (IBUPROFEN PM) 200-38 MG TABS Take 2 tablets by mouth at bedtime as needed (for sleep).   Yes Historical Provider, Cheryl Mueller  lisinopril (PRINIVIL,ZESTRIL) 40 MG tablet Take 40 mg by mouth daily.    Yes Historical Provider, Cheryl Mueller  metFORMIN (GLUCOPHAGE) 1000 MG tablet Take 1,000 mg by mouth 2 (two) times daily with a meal.   Yes Historical Provider, Cheryl Mueller  mometasone (ELOCON) 0.1 % cream Apply 1 application topically daily as needed (for itching).   Yes Historical Provider, Cheryl Mueller  oxyCODONE (OXY IR/ROXICODONE) 5 MG immediate release tablet Take 1-2 tablets (5-10 mg total) by mouth every 4 (four) hours as needed for breakthrough pain. 04/17/16  Yes Cheryl [place], Cheryl Mueller  oxyCODONE-acetaminophen (ROXICET) 5-325 MG tablet Take 1-2 tablets by mouth every 6 (six) hours as needed. 06/12/16  Yes Cheryl Blazer, Cheryl Mueller  potassium chloride (K-DUR,KLOR-CON) 10 MEQ tablet Take 1 tablet by mouth daily. 05/10/15  Yes Historical Provider, Cheryl Mueller  rOPINIRole (REQUIP) 0.25 MG tablet Take 1 tablet (0.25 mg total) by mouth at bedtime. 04/21/15  Yes Cheryl Sites III, Cheryl Mueller  sitaGLIPtin (JANUVIA) 100 MG tablet Take 1 tablet by mouth daily. 09/05/15 09/04/16 Yes Historical Provider, Cheryl Mueller  traZODone (DESYREL) 50 MG tablet Take 0.5 tablets  (25 mg total) by mouth at bedtime. 10/18/15  Yes Enid Baas, Cheryl Mueller  venlafaxine XR (EFFEXOR-XR) 75 MG 24 hr capsule Take 1 capsule by mouth daily. 06/19/16  Yes Historical Provider, Cheryl Mueller  lidocaine (LIDODERM) 5 % Place 1 patch onto the skin every 12 (twelve) hours. Remove & Discard patch within 12 hours or as directed by Cheryl Mueller Patient not taking: Reported on 06/26/2016 02/19/16   Cheryl Cheek, Cheryl Mueller   [place] Extrem Up Left Ltd  Result Date: 06/26/2016 CLINICAL DATA:  Left arm cellulitis. EXAM: ULTRASOUND Left UPPER EXTREMITY LIMITED TECHNIQUE: Ultrasound examination of the upper extremity soft tissues was performed in the area of clinical concern. COMPARISON:  None FINDINGS: In the region of concern, there is a small amount of infiltrative edema in the subcutaneous tissues, for example on image 23. No abscess. No mass. IMPRESSION: 1. Mild infiltrative subcutaneous edema in the region of concern which could be a manifestation of cellulitis. No abscess or mass  identified. Electronically Signed   By: Gaylyn Rong M.D.   On: 06/26/2016 17:12    Positive ROS: All other systems have been reviewed and were otherwise negative with the exception of those mentioned in the HPI and as above.  Physical Exam: General:  Alert, no acute distress Psychiatric:  Patient is competent for consent with normal mood and affect   Cardiovascular:  No pedal edema Respiratory:  No wheezing, non-labored breathing GI:  Abdomen is soft and non-tender Skin:  No lesions in the area of chief complaint Neurologic:  Sensation intact distally Lymphatic:  No axillary or cervical lymphadenopathy  Orthopedic Exam:  Orthopedic examination is limited to her left shoulder and upper extremity. The surgical incision anteriorly appears to be well-healed. There is no swelling or erythema around the shoulder itself anteriorly or laterally, and there does not appear to be any shoulder effusion. However, there is an area of significant swelling,  erythema, and induration around the midportion of the upper arm extending from the anterior aspect of the arm around to the posterior aspect of the shoulder and axillary region. There is a dried scab over the posterior lateral shoulder region with no active drainage at this time. The patient can tolerate gentle flexion and extension of the shoulder, as well as internal and external rotation, without discomfort. She is neurovascularly intact left upper extremity and hand.  X-rays:  No recent x-rays of the left shoulder are available for review.  Assessment: Left upper extremity cellulitis status post reverse left total shoulder arthroplasty.  Plan: Given the patient's presentation and cellulitis involving the upper arm, there certainly is concern for involvement of the underlying reverse shoulder arthroplasty even though there does not appear to be any effusion, swelling, erythema erythema around the joint itself. Therefore, after obtaining verbal consent from the patient, two attempts were made to aspirate the glenohumeral joint using sterile technique. In each case, no fluid could be aspirated even though it was clear that I was in the joint as I could feel the needle bump against the prosthesis. Therefore, it is hopeful that the joint itself is not involved.   Given these findings, I feel that it is best to observe the patient closely over the next few days and to continue IV antibiotics as prescribed. If this is purely a cellulitis, then the symptoms of pain, swelling, and erythema should respond to the IV antibiotics. I will also order x-rays of the left shoulder as a baseline.  Thank you for ask me to participate in the care of this most pleasant unfortunate woman. I will be happy to follow her closely with you.    Maryagnes Amos, Cheryl Mueller  Beeper #:  [phone]  06/28/2016 5:02 PM

## 2016-06-28 NOTE — Progress Notes (Signed)
Pharmacy Antibiotic Note  Cheryl Mueller is a 76 y.o. female admitted on 06/26/2016 with cellulitis.  Pharmacy has been consulted for Vancomycin dosing.  Currently ordered Zosyn 3.375g IV q8 EI  Plan: Vancomycin 500 IV every 24 hours.  Goal trough 10-15 mcg/mL.  Height: 4\' 11"  (149.9 cm) Weight: 123 lb 6.4 oz (56 kg) IBW/kg (Calculated) : 43.2  Temp (24hrs), Avg:98 F (36.7 C), Min:97.8 F (36.6 C), Max:98.3 F (36.8 C)   Recent Labs Lab 06/26/16 1439 06/26/16 1548 06/26/16 1858 06/27/16 0440 06/28/16 0420  WBC 28.2*  --   --  21.0* 17.3*  CREATININE 1.20*  --   --  0.73 0.86  LATICACIDVEN  --  3.5* 2.2*  --   --     Estimated Creatinine Clearance: 41.8 mL/min (by C-G formula based on SCr of 0.86 mg/dL).    Allergies  Allergen Reactions  . Alendronate Other (See Comments)    Reaction:  Leg pain   . Atorvastatin Rash  . Formaldehyde Rash    Antimicrobials this admission: Vancomycin 1/3 >> 1/4 Zosyn 1/3 >>  Vancomycin 1/5 >>   Thank you for allowing pharmacy to be a part of this patient's care.  Clovia CuffLisa Lejuan Botto, PharmD, BCPS 06/28/2016 3:05 PM

## 2016-06-28 NOTE — Consult Note (Signed)
[facility] Infectious Disease     Reason for Consult Cellulitis, shingles    Referring Physician: Bobetta Lime Date of Admission:  06/26/2016   Active Problems:   Cellulitis   HPI: Cheryl Mueller is a 76 y.o. female Admitted with a wound infection.  She states she has had pain in her left arm and back.  She was noted to have cellulitis in her arm as well as possible shingles.  On admission her white count was elevated at 28,000 but she has been afebrile.  She was started on vancomycin and Zosyn.  White counts come down to 17,000.  MRSA PCR was negative blood cultures are negative.  She had a urine culture done with greater than 100,000 E. coli growing.  Urinalysis on admission showed too numerous to count white cells.  She had an ultrasound of her left upper extremity which showed mild infiltrative subcutaneous edema is a manifestation of cellulitis but no abscess or mass.  Clinically her arm has improved. On her back she also has an area of the rash which appears to be consistent with shingles.  She does have some back pain but it is unclear if it is this in this area  Past Medical History:  Diagnosis Date  . Anxiety   . Arthritis   . Cervical myelopathy ([place])   . Diabetes mellitus without complication (HCC)    Non Insulin dependant  . Headache    h/o migraines  . Hyperlipemia   . Hypertension   . Restless leg syndrome   . Stroke [facility]) 2002   mini stroke  . TIA (transient ischemic attack)    approx 15 years ago  . Vertigo    hx of  . White matter disease    Past Surgical History:  Procedure Laterality Date  . BACK SURGERY  2012   kyphoplasty lower back  . COLONOSCOPY W/ POLYPECTOMY    . DEBRIDEMENT OF ABDOMINAL WALL ABSCESS N/A 10/16/2015   Procedure: I & D OF ABDOMINAL WALL ABSCESS;  Surgeon: Florene Glen, MD;  Location: [facility];  Service: General;  Laterality: N/A;  . KYPHOPLASTY N/A 04/19/2015   Procedure: KYPHOPLASTY L 1;  Surgeon: Hessie Knows, MD;  Location: [facility]  [facility];  Service: Orthopedics;  Laterality: N/A;  . KYPHOPLASTY N/A 02/27/2016   Procedure: KYPHOPLASTY;  Surgeon: Hessie Knows, MD;  Location: [facility];  Service: Orthopedics;  Laterality: N/A;  . LUMBAR LAMINECTOMY/DECOMPRESSION MICRODISCECTOMY Right 11/26/2012   Procedure: LUMBAR LAMINECTOMY/DECOMPRESSION MICRODISCECTOMY 1 LEVEL;  Surgeon: Hosie Spangle, MD;  Location: [facility];  Service: Neurosurgery;  Laterality: Right;  Lumbar five-sacral one laminotomy and microdiskectomy   . ORIF PATELLA Left 03/23/2015   Procedure: OPEN REDUCTION INTERNAL (ORIF) FIXATION PATELLA;  Surgeon: Hessie Knows, MD;  Location: [facility];  Service: Orthopedics;  Laterality: Left;  . REVERSE SHOULDER ARTHROPLASTY Left 04/16/2016   Procedure: REVERSE SHOULDER ARTHROPLASTY;  Surgeon: Corky Mull, MD;  Location: [facility];  Service: Orthopedics;  Laterality: Left;   Social History  Substance Use Topics  . Smoking status: Never Smoker  . Smokeless tobacco: Never Used  . Alcohol use No   Family History  Problem Relation Age of Onset  . Stroke Mother   . Hypertension Mother   . Diabetes Father   . Breast cancer Neg Hx     Allergies:  Allergies  Allergen Reactions  . Alendronate Other (See Comments)    Reaction:  Leg pain   . Atorvastatin Rash  . Formaldehyde Rash  Current antibiotics: Antibiotics Given (last 72 hours)    Date/Time Action Medication Dose Rate   06/26/16 2141 Given   piperacillin-tazobactam (ZOSYN) IVPB 3.375 g 3.375 g 12.5 mL/hr   06/27/16 0510 Given   piperacillin-tazobactam (ZOSYN) IVPB 3.375 g 3.375 g 12.5 mL/hr   06/27/16 1628 Given   piperacillin-tazobactam (ZOSYN) IVPB 3.375 g 3.375 g 12.5 mL/hr   06/27/16 2332 Given   piperacillin-tazobactam (ZOSYN) IVPB 3.375 g 3.375 g 12.5 mL/hr   06/28/16 0742 Given   piperacillin-tazobactam (ZOSYN) IVPB 3.375 g 3.375 g 12.5 mL/hr      MEDICATIONS: . amLODipine  10 mg Oral Daily  . atorvastatin  80 mg Oral QHS  . enoxaparin  (LOVENOX) injection  40 mg Subcutaneous Q24H  . glimepiride  4 mg Oral Q breakfast  . insulin aspart  0-9 Units Subcutaneous TID WC & HS  . lidocaine  1 patch Transdermal Q12H  . linagliptin  5 mg Oral Daily  . metFORMIN  1,000 mg Oral BID WC  . piperacillin-tazobactam (ZOSYN)  IV  3.375 g Intravenous Q8H  . potassium chloride  10 mEq Oral Daily  . rOPINIRole  0.25 mg Oral QHS  . traZODone  25 mg Oral QHS  . venlafaxine XR  75 mg Oral Daily    Review of Systems - 11 systems reviewed and negative per HPI   OBJECTIVE: Temp:  [97.8 F (36.6 C)-98.3 F (36.8 C)] 98.3 F (36.8 C) (01/05 0813) Pulse Rate:  [104-115] 107 (01/05 0813) Resp:  [18] 18 (01/05 0813) BP: (116-128)/(54-61) 128/60 (01/05 0813) SpO2:  [92 %-95 %] 95 % (01/05 0813) Physical Exam  Constitutional:  oriented to person, place, and time. Frail, kyphtoic, diff sitting up in bed HENT: [place]/AT, PERRLA, no scleral icterus Mouth/Throat: Oropharynx is clear and dry . No oropharyngeal exudate.  Cardiovascular: Normal rate, regular rhythm and normal heart sounds. Exam reveals no gallop and no friction rub.  No murmur heard.  Pulmonary/Chest: Effort normal and breath sounds normal. NNeck = supple, no nuchal rigidity Abdominal: Soft. Bowel sounds are normal.  exhibits no distension. There is no tenderness.  Lymphadenopathy: no cervical adenopathy. No axillary adenopathy Neurological: alert and oriented to person, place, and time.  Skin: L upper arm with extensive induration, swelling and erythema, tender to touch.  Ext limited rom LUE Psychiatric: a normal mood and affect.  behavior is normal.    LABS: Results for orders placed or performed during the hospital encounter of 06/26/16 (from the past 48 hour(s))  Comprehensive metabolic panel     Status: Abnormal   Collection Time: 06/26/16  2:39 PM  Result Value Ref Range   Sodium 131 (L) 135 - 145 mmol/L   Potassium 3.3 (L) 3.5 - 5.1 mmol/L   Chloride 93 (L) 101 - 111  mmol/L   CO2 23 22 - 32 mmol/L   Glucose, Bld 242 (H) 65 - 99 mg/dL   BUN 29 (H) 6 - 20 mg/dL   Creatinine, Ser 1.20 (H) 0.44 - 1.00 mg/dL   Calcium 9.9 8.9 - 10.3 mg/dL   Total Protein 6.8 6.5 - 8.1 g/dL   Albumin 2.9 (L) 3.5 - 5.0 g/dL   AST 56 (H) 15 - 41 U/L   ALT 26 14 - 54 U/L   Alkaline Phosphatase 132 (H) 38 - 126 U/L   Total Bilirubin <0.1 (L) 0.3 - 1.2 mg/dL   GFR calc non Af Amer 42 (L) >60 mL/min   GFR calc Af Amer 49 (L) >60 mL/min  Comment: (NOTE) The eGFR has been calculated using the CKD EPI equation. This calculation has not been validated in all clinical situations. eGFR's persistently <60 mL/min signify possible Chronic Kidney Disease.    Anion gap 15 5 - 15  CBC with Differential     Status: Abnormal   Collection Time: 06/26/16  2:39 PM  Result Value Ref Range   WBC 28.2 (H) 3.6 - 11.0 K/uL   RBC 3.52 (L) 3.80 - 5.20 MIL/uL   Hemoglobin 10.2 (L) 12.0 - 16.0 g/dL   HCT 30.5 (L) 35.0 - 47.0 %   MCV 86.5 80.0 - 100.0 fL   MCH 29.0 26.0 - 34.0 pg   MCHC 33.6 32.0 - 36.0 g/dL   RDW 13.7 11.5 - 14.5 %   Platelets 575 (H) 150 - 440 K/uL   Neutrophils Relative % 94 %   Neutro Abs 26.7 (H) 1.4 - 6.5 K/uL   Lymphocytes Relative 1 %   Lymphs Abs 0.3 (L) 1.0 - 3.6 K/uL   Monocytes Relative 4 %   Monocytes Absolute 1.0 (H) 0.2 - 0.9 K/uL   Eosinophils Relative 0 %   Eosinophils Absolute 0.1 0 - 0.7 K/uL   Basophils Relative 1 %   Basophils Absolute 0.1 0 - 0.1 K/uL  Blood culture (routine x 2)     Status: None (Preliminary result)   Collection Time: 06/26/16  3:48 PM  Result Value Ref Range   Specimen Description BLOOD RIGHT AC    Special Requests      BOTTLES DRAWN AEROBIC AND ANAEROBIC AER 13ML ANA 10ML   Culture NO GROWTH 2 DAYS    Report Status PENDING   Lactic acid, plasma     Status: Abnormal   Collection Time: 06/26/16  3:48 PM  Result Value Ref Range   Lactic Acid, Venous 3.5 (HH) 0.5 - 1.9 mmol/L    Comment: CRITICAL RESULT CALLED TO, READ BACK  BY AND VERIFIED WITH SONJIA WEAVER 06/26/16 @ 1628  MLK   Blood culture (routine x 2)     Status: None (Preliminary result)   Collection Time: 06/26/16  4:27 PM  Result Value Ref Range   Specimen Description BLOOD LEFT FOREARM    Special Requests      BOTTLES DRAWN AEROBIC AND ANAEROBIC AER 11ML ANA 1ML   Culture NO GROWTH 2 DAYS    Report Status PENDING   Urinalysis, Routine w reflex microscopic     Status: Abnormal   Collection Time: 06/26/16  6:01 PM  Result Value Ref Range   Color, Urine AMBER (A) YELLOW    Comment: BIOCHEMICALS MAY BE AFFECTED BY COLOR   APPearance CLOUDY (A) CLEAR   Specific Gravity, Urine 1.024 1.005 - 1.030   pH 5.0 5.0 - 8.0   Glucose, UA NEGATIVE NEGATIVE mg/dL   Hgb urine dipstick NEGATIVE NEGATIVE   Bilirubin Urine NEGATIVE NEGATIVE   Ketones, ur 5 (A) NEGATIVE mg/dL   Protein, ur 100 (A) NEGATIVE mg/dL   Nitrite NEGATIVE NEGATIVE   Leukocytes, UA MODERATE (A) NEGATIVE   RBC / HPF 6-30 0 - 5 RBC/hpf   WBC, UA TOO NUMEROUS TO COUNT 0 - 5 WBC/hpf   Bacteria, UA MANY (A) NONE SEEN   Squamous Epithelial / LPF 0-5 (A) NONE SEEN   WBC Clumps PRESENT    Mucous PRESENT   Urine culture     Status: Abnormal (Preliminary result)   Collection Time: 06/26/16  6:01 PM  Result Value Ref Range   Specimen  Description URINE, CATHETERIZED    Special Requests NONE    Culture (A)     >=100,000 COLONIES/mL ESCHERICHIA COLI SUSCEPTIBILITIES TO FOLLOW Performed at [facility]    Report Status PENDING   Lactic acid, plasma     Status: Abnormal   Collection Time: 06/26/16  6:58 PM  Result Value Ref Range   Lactic Acid, Venous 2.2 (HH) 0.5 - 1.9 mmol/L    Comment: CRITICAL RESULT CALLED TO, READ BACK BY AND VERIFIED WITH SONJIA WEAVER 06/26/16 @ 1942  MLK   Glucose, capillary     Status: None   Collection Time: 06/26/16  9:27 PM  Result Value Ref Range   Glucose-Capillary 92 65 - 99 mg/dL  MRSA PCR Screening     Status: None   Collection Time: 06/26/16   9:39 PM  Result Value Ref Range   MRSA by PCR NEGATIVE NEGATIVE    Comment:        The GeneXpert MRSA Assay (FDA approved for NASAL specimens only), is one component of a comprehensive MRSA colonization surveillance program. It is not intended to diagnose MRSA infection nor to guide or monitor treatment for MRSA infections.   Basic metabolic panel     Status: Abnormal   Collection Time: 06/27/16  4:40 AM  Result Value Ref Range   Sodium 134 (L) 135 - 145 mmol/L   Potassium 3.2 (L) 3.5 - 5.1 mmol/L   Chloride 106 101 - 111 mmol/L   CO2 23 22 - 32 mmol/L   Glucose, Bld 42 (LL) 65 - 99 mg/dL    Comment: CRITICAL RESULT CALLED TO, READ BACK BY AND VERIFIED WITH  ANESSA MACROHON AT 5102 06/27/16 SDR    BUN 23 (H) 6 - 20 mg/dL   Creatinine, Ser 0.73 0.44 - 1.00 mg/dL   Calcium 8.2 (L) 8.9 - 10.3 mg/dL   GFR calc non Af Amer >60 >60 mL/min   GFR calc Af Amer >60 >60 mL/min    Comment: (NOTE) The eGFR has been calculated using the CKD EPI equation. This calculation has not been validated in all clinical situations. eGFR's persistently <60 mL/min signify possible Chronic Kidney Disease.    Anion gap 5 5 - 15  CBC     Status: Abnormal   Collection Time: 06/27/16  4:40 AM  Result Value Ref Range   WBC 21.0 (H) 3.6 - 11.0 K/uL   RBC 3.00 (L) 3.80 - 5.20 MIL/uL   Hemoglobin 8.7 (L) 12.0 - 16.0 g/dL   HCT 25.8 (L) 35.0 - 47.0 %   MCV 85.9 80.0 - 100.0 fL   MCH 29.0 26.0 - 34.0 pg   MCHC 33.8 32.0 - 36.0 g/dL   RDW 14.0 11.5 - 14.5 %   Platelets 430 150 - 440 K/uL  Glucose, capillary     Status: Abnormal   Collection Time: 06/27/16  6:31 AM  Result Value Ref Range   Glucose-Capillary 41 (LL) 65 - 99 mg/dL  Glucose, capillary     Status: None   Collection Time: 06/27/16  6:57 AM  Result Value Ref Range   Glucose-Capillary 87 65 - 99 mg/dL  Glucose, capillary     Status: Abnormal   Collection Time: 06/27/16  8:13 AM  Result Value Ref Range   Glucose-Capillary 149 (H) 65 - 99  mg/dL   Comment 1 Notify RN   Glucose, capillary     Status: Abnormal   Collection Time: 06/27/16 11:49 AM  Result Value Ref Range  Glucose-Capillary 149 (H) 65 - 99 mg/dL   Comment 1 Notify RN   Glucose, capillary     Status: Abnormal   Collection Time: 06/27/16  4:22 PM  Result Value Ref Range   Glucose-Capillary 194 (H) 65 - 99 mg/dL   Comment 1 Notify RN   Glucose, capillary     Status: Abnormal   Collection Time: 06/27/16  8:52 PM  Result Value Ref Range   Glucose-Capillary 260 (H) 65 - 99 mg/dL  CBC     Status: Abnormal   Collection Time: 06/28/16  4:20 AM  Result Value Ref Range   WBC 17.3 (H) 3.6 - 11.0 K/uL   RBC 2.88 (L) 3.80 - 5.20 MIL/uL   Hemoglobin 8.3 (L) 12.0 - 16.0 g/dL   HCT 24.9 (L) 35.0 - 47.0 %   MCV 86.7 80.0 - 100.0 fL   MCH 28.8 26.0 - 34.0 pg   MCHC 33.2 32.0 - 36.0 g/dL   RDW 13.9 11.5 - 14.5 %   Platelets 413 150 - 440 K/uL  Basic metabolic panel     Status: Abnormal   Collection Time: 06/28/16  4:20 AM  Result Value Ref Range   Sodium 136 135 - 145 mmol/L   Potassium 3.5 3.5 - 5.1 mmol/L   Chloride 106 101 - 111 mmol/L   CO2 24 22 - 32 mmol/L   Glucose, Bld 124 (H) 65 - 99 mg/dL   BUN 24 (H) 6 - 20 mg/dL   Creatinine, Ser 0.86 0.44 - 1.00 mg/dL   Calcium 7.8 (L) 8.9 - 10.3 mg/dL   GFR calc non Af Amer >60 >60 mL/min   GFR calc Af Amer >60 >60 mL/min    Comment: (NOTE) The eGFR has been calculated using the CKD EPI equation. This calculation has not been validated in all clinical situations. eGFR's persistently <60 mL/min signify possible Chronic Kidney Disease.    Anion gap 6 5 - 15  Glucose, capillary     Status: Abnormal   Collection Time: 06/28/16  8:11 AM  Result Value Ref Range   Glucose-Capillary 102 (H) 65 - 99 mg/dL  Glucose, capillary     Status: Abnormal   Collection Time: 06/28/16 11:53 AM  Result Value Ref Range   Glucose-Capillary 261 (H) 65 - 99 mg/dL   No components found for: ESR, C REACTIVE PROTEIN MICRO: Recent  Results (from the past 720 hour(s))  Blood culture (routine x 2)     Status: None (Preliminary result)   Collection Time: 06/26/16  3:48 PM  Result Value Ref Range Status   Specimen Description BLOOD RIGHT AC  Final   Special Requests   Final    BOTTLES DRAWN AEROBIC AND ANAEROBIC AER 13ML ANA 10ML   Culture NO GROWTH 2 DAYS  Final   Report Status PENDING  Incomplete  Blood culture (routine x 2)     Status: None (Preliminary result)   Collection Time: 06/26/16  4:27 PM  Result Value Ref Range Status   Specimen Description BLOOD LEFT FOREARM  Final   Special Requests   Final    BOTTLES DRAWN AEROBIC AND ANAEROBIC AER 11ML ANA 1ML   Culture NO GROWTH 2 DAYS  Final   Report Status PENDING  Incomplete  Urine culture     Status: Abnormal (Preliminary result)   Collection Time: 06/26/16  6:01 PM  Result Value Ref Range Status   Specimen Description URINE, CATHETERIZED  Final   Special Requests NONE  Final  Culture (A)  Final    >=100,000 COLONIES/mL ESCHERICHIA COLI SUSCEPTIBILITIES TO FOLLOW Performed at [facility]    Report Status PENDING  Incomplete  MRSA PCR Screening     Status: None   Collection Time: 06/26/16  9:39 PM  Result Value Ref Range Status   MRSA by PCR NEGATIVE NEGATIVE Final    Comment:        The GeneXpert MRSA Assay (FDA approved for NASAL specimens only), is one component of a comprehensive MRSA colonization surveillance program. It is not intended to diagnose MRSA infection nor to guide or monitor treatment for MRSA infections.     IMAGING: Ct Chest Wo Contrast  Result Date: 06/12/2016 CLINICAL DATA:  Patient fell 2 days ago and hit her back. Left rib cage pain. EXAM: CT CHEST WITHOUT CONTRAST TECHNIQUE: Multidetector CT imaging of the chest was performed following the standard protocol without IV contrast. COMPARISON:  None. FINDINGS: Cardiovascular: The heart size is normal. No pericardial effusion. Coronary artery calcification is noted.  Atherosclerotic calcification is noted in the wall of the thoracic aorta. Ascending thoracic aorta measures 3.9 cm maximum diameter. Mediastinum/Nodes: No mediastinal lymphadenopathy. No evidence for gross hilar lymphadenopathy although assessment is limited by the lack of intravenous contrast on today's study. Small hiatal hernia noted. Esophagus otherwise unremarkable. There is no axillary lymphadenopathy. Lungs/Pleura: No focal airspace consolidation. No pulmonary edema or pleural effusion. Dependent atelectasis noted left lower lobe posteriorly with areas of apparent scarring in the right middle lobe, lingula, and peripheral lower lobes bilaterally. Upper Abdomen: Unremarkable. Musculoskeletal: Patient is status post left shoulder replacement. Acute fractures are identified in the posterior left tenth and eleventh ribs. T10 compression fracture was present on chest x-ray from 04/15/2016. Axial images show evidence of vertebral augmentation at L1. IMPRESSION: 1. Acute fracture involving posterior left tenth and eleventh ribs. 2. T10 compression fracture present on chest x-ray from 04/15/2016. 3. No acute cardiopulmonary findings. Electronically Signed   By: Misty Stanley M.D.   On: 06/12/2016 14:36   [place] Extrem Up Left Ltd  Result Date: 06/26/2016 CLINICAL DATA:  Left arm cellulitis. EXAM: ULTRASOUND Left UPPER EXTREMITY LIMITED TECHNIQUE: Ultrasound examination of the upper extremity soft tissues was performed in the area of clinical concern. COMPARISON:  None FINDINGS: In the region of concern, there is a small amount of infiltrative edema in the subcutaneous tissues, for example on image 23. No abscess. No mass. IMPRESSION: 1. Mild infiltrative subcutaneous edema in the region of concern which could be a manifestation of cellulitis. No abscess or mass identified. Electronically Signed   By: Van Clines M.D.   On: 06/26/2016 17:12    Assessment:   Cheryl Mueller is a 76 y.o. female Admitted with  left upper extremity cellulitis as well as urinary tract infection and possible shingles eruption on her back.  Of note she has a recent T10 compression fx and L 10th 11th rib fx. Also recent L Total shoulderr done oct. She has negative blood cultures. Concern for infection related to L shoulder joint.  Not convinced that the back lesions are shingles vs just excoriations given increasing bedbound status after recent T spine compression fx.  Recommendations Consult ortho to eval the shoulder joint Add back vanco for now. Cont zosyn\ Check esr crp Elevate arm  Thank you very much for allowing me to participate in the care of this patient. Please call with questions.   Cheral Marker. Ola Spurr, MD

## 2016-06-28 NOTE — Progress Notes (Signed)
[facility] - [place] [facility]   PATIENT NAME: Cheryl Marchnn Mueller    MR#:  [id number]  DATE OF BIRTH:  May 09, 1941  SUBJECTIVE:   Still having significant swelling and pain in her left upper extremity. White cell count trending down. Seen by ID and Ortho consult placed to evaluated left shoulder joint.    REVIEW OF SYSTEMS:    Review of Systems  Constitutional: Negative for chills and fever.  HENT: Negative for congestion and tinnitus.   Eyes: Negative for blurred vision and double vision.  Respiratory: Negative for cough, shortness of breath and wheezing.   Cardiovascular: Negative for chest pain, orthopnea and PND.  Gastrointestinal: Negative for abdominal pain, diarrhea, nausea and vomiting.  Genitourinary: Negative for dysuria and hematuria.  Musculoskeletal: Positive for back pain.  Skin: Positive for rash.  Neurological: Negative for dizziness, sensory change and focal weakness.  All other systems reviewed and are negative.   Nutrition: Healthy/Carb control Tolerating Diet: Yes Tolerating PT: Await Eval.    DRUG ALLERGIES:   Allergies  Allergen Reactions  . Alendronate Other (See Comments)    Reaction:  Leg pain   . Atorvastatin Rash  . Formaldehyde Rash    VITALS:  Blood pressure 128/60, pulse (!) 107, temperature 98.3 F (36.8 C), resp. rate 18, height 4\' 11"  (1.499 m), weight 56 kg (123 lb 6.4 oz), SpO2 95 %.  PHYSICAL EXAMINATION:   Physical Exam  GENERAL:  76 y.o.-year-old cachectic patient lying in the bed in mild distress  EYES: Pupils equal, round, reactive to light and accommodation. No scleral icterus. Extraocular muscles intact.  HEENT: Head atraumatic, normocephalic. Oropharynx and nasopharynx clear.  NECK:  Supple, no jugular venous distention. No thyroid enlargement, no tenderness.  LUNGS: Normal breath sounds bilaterally, no wheezing, rales, rhonchi. No use of accessory muscles of respiration.  CARDIOVASCULAR: S1, S2 normal. No  murmurs, rubs, or gallops.  ABDOMEN: Soft, nontender, nondistended. Bowel sounds present. No organomegaly or mass.  EXTREMITIES: No cyanosis, clubbing or edema b/l. Left upper ext. Swelling, redness fluctuance due to cellulitis.    NEUROLOGIC: Cranial nerves II through XII are intact. No focal Motor or sensory deficits b/l. Globally weak  PSYCHIATRIC: The patient is alert and oriented x 3.   SKIN: Dry excoriating rash on the mid-back in dermatomal pattern but no vesicular lesions or draining noted.     LABORATORY PANEL:   CBC  Recent Labs Lab 06/28/16 0420  WBC 17.3*  HGB 8.3*  HCT 24.9*  PLT 413   ------------------------------------------------------------------------------------------------------------------  Chemistries   Recent Labs Lab 06/26/16 1439  06/28/16 0420  NA 131*  < > 136  K 3.3*  < > 3.5  CL 93*  < > 106  CO2 23  < > 24  GLUCOSE 242*  < > 124*  BUN 29*  < > 24*  CREATININE 1.20*  < > 0.86  CALCIUM 9.9  < > 7.8*  AST 56*  --   --   ALT 26  --   --   ALKPHOS 132*  --   --   BILITOT <0.1*  --   --   < > = values in this interval not displayed. ------------------------------------------------------------------------------------------------------------------  Cardiac Enzymes No results for input(s): TROPONINI in the last 168 hours. ------------------------------------------------------------------------------------------------------------------  RADIOLOGY:  [place] Extrem Up Left Ltd  Result Date: 06/26/2016 CLINICAL DATA:  Left arm cellulitis. EXAM: ULTRASOUND Left UPPER EXTREMITY LIMITED TECHNIQUE: Ultrasound examination of the upper extremity soft tissues was performed in the  area of clinical concern. COMPARISON:  None FINDINGS: In the region of concern, there is a small amount of infiltrative edema in the subcutaneous tissues, for example on image 23. No abscess. No mass. IMPRESSION: 1. Mild infiltrative subcutaneous edema in the region of concern which  could be a manifestation of cellulitis. No abscess or mass identified. Electronically Signed   By: Gaylyn Rong M.D.   On: 06/26/2016 17:12     ASSESSMENT AND PLAN:   76 year old female with past medical history of obesity knees, hypertension, hyperlipidemia, history of previous CVA, restless leg syndrome, anxiety, osteoarthritis of presented to the hospital due to back pain and redness swelling and pain of the left upper extremity.  1. Left upper extremity cellulitis- seen by ID and they are concerned that shoulder joint maybe involved. Ortho consult placed.  - add Vanco again and cont. IV Zosyn and keep arm elevated.  -Dopplers of the left upper extremity and negative for DVT.  2. Back pain with rash- pt. noted to have a dry excoriating rash in a dermatomal pattern on the mid back. There is no acute vesicular areas. -Continue Lidoderm patch, supportive care. No evidence of active shingles.    3. Generalized weakness-difficulty walking-secondary to deconditioning. - await PT eval.   4. DM - cont. Amaryl, Metformin, SSI.   - cont. Carb control diet.  BS stable.   5. Hyperlipidemia - cont. Atorvastatin.   6. Essential HTN - cont. Norvasc  7. Restless leg syndrome - cont. Requip.    All the records are reviewed and case discussed with Care Management/Social Worker. Management plans discussed with the patient, family and they are in agreement.  CODE STATUS: Full code  DVT Prophylaxis: Lovenox  TOTAL TIME TAKING CARE OF THIS PATIENT: 30 minutes.   POSSIBLE D/C IN 2-3 DAYS, DEPENDING ON CLINICAL CONDITION.   Houston Siren M.D on 06/28/2016 at 3:36 PM  Between 7am to 6pm - Pager - [phone]  After 6pm go to [email or web address] - Social research officer, government  [employer] [facility]  Office  [phone]  CC: Primary care physician; Lynnea Ferrier, MD

## 2016-06-28 NOTE — Progress Notes (Signed)
Clinical Child psychotherapistocial Worker (CSW) presented bed offers to patient. She chose [facility]. [facility] liaision is aware of accepted bed offer and stated that patient can come over the weekend if stable. CSW will continue to follow and assist as needed.   Baker Hughes IncorporatedBailey Vernelle Wisner, LCSW [phone]

## 2016-06-28 NOTE — Progress Notes (Signed)
Shift assessment completed at 0730. Pt easily awakened and is repositioned by this writer, pt appears feeble in her movements, stated she is "always in pain" when this writer asked, is grimacing with positioning. Pt is alert and oriented x4, O2 on at [facility], pt stated she does not use o2 chronically, respirations are shallow, lungs are clear bilat. Hr is regular, abdomen is soft, bs heard. Pt is wearing incontinence brief, ppp, scd's on bilat. Pt LUA appears swollen and has a blck ink outl;ine from redness on admission, color appears to have faded to within these parameters, pt stated that this area is still sore. PIV #22 intact to R arm with site free of redness and swelling. Pt able to swallow meds in applesauce. ON rounds at 0930, pt informed this Clinical research associatewriter that she is unable to feed herself, this Clinical research associatewriter assisted pt again with positioning, and breakfast. Pt had no difficulty chewing and swallowing. Dr. Cherlynn KaiserSainani has rounded on pt, this writer gave report to [facility],RN, care of pt is released at this time.

## 2016-06-28 NOTE — Progress Notes (Signed)
Pt alert, forgetful at times. Medicated for pain x1  With good results. Able to get sleep during the night. Iv antibiotic infusing without difficulty. Still remaining incontinent of urine.

## 2016-06-29 LAB — CBC
HCT: 25.9 % — ABNORMAL LOW (ref 35.0–47.0)
Hemoglobin: 8.7 g/dL — ABNORMAL LOW (ref 12.0–16.0)
MCH: 28.5 pg (ref 26.0–34.0)
MCHC: 33.4 g/dL (ref 32.0–36.0)
MCV: 85.4 fL (ref 80.0–100.0)
PLATELETS: 425 10*3/uL (ref 150–440)
RBC: 3.04 MIL/uL — ABNORMAL LOW (ref 3.80–5.20)
RDW: 13.8 % (ref 11.5–14.5)
WBC: 14 10*3/uL — AB (ref 3.6–11.0)

## 2016-06-29 LAB — GLUCOSE, CAPILLARY
GLUCOSE-CAPILLARY: 117 mg/dL — AB (ref 65–99)
GLUCOSE-CAPILLARY: 191 mg/dL — AB (ref 65–99)
GLUCOSE-CAPILLARY: 236 mg/dL — AB (ref 65–99)
Glucose-Capillary: 49 mg/dL — ABNORMAL LOW (ref 65–99)
Glucose-Capillary: 61 mg/dL — ABNORMAL LOW (ref 65–99)
Glucose-Capillary: 230 mg/dL — ABNORMAL HIGH (ref 65–99)

## 2016-06-29 LAB — C DIFFICILE QUICK SCREEN W PCR REFLEX
C Diff antigen: POSITIVE — AB
C Diff toxin: NEGATIVE

## 2016-06-29 LAB — CLOSTRIDIUM DIFFICILE BY PCR: CDIFFPCR: NEGATIVE

## 2016-06-29 MED ORDER — GLIMEPIRIDE 2 MG PO TABS
1.0000 mg | ORAL_TABLET | Freq: Every day | ORAL | Status: DC
  Administered 2016-06-30 – 2016-07-03 (×3): 1 mg via ORAL
  Filled 2016-06-29: qty 2
  Filled 2016-06-29 (×3): qty 1

## 2016-06-29 NOTE — Progress Notes (Signed)
Subjective: The patient states that she slept well last night but still has at least moderate pain in the left posterior shoulder and upper extremity region. No new complaints are noted.   Objective: Vital signs in last 24 hours: Temp:  [97.7 F (36.5 C)-98.1 F (36.7 C)] 97.7 F (36.5 C) (01/06 0841) Pulse Rate:  [82-101] 88 (01/06 0841) Resp:  [18] 18 (01/06 0841) BP: (120-123)/(50-58) 120/50 (01/06 0841) SpO2:  [91 %-95 %] 95 % (01/06 0841)  Intake/Output from previous day: 01/05 0701 - 01/06 0700 In: 675 [P.O.:440; IV Piggyback:235] Out: -  Intake/Output this shift: No intake/output data recorded.   Recent Labs  06/26/16 1439 06/27/16 0440 06/28/16 0420 06/29/16 0308  HGB 10.2* 8.7* 8.3* 8.7*    Recent Labs  06/28/16 0420 06/29/16 0308  WBC 17.3* 14.0*  RBC 2.88* 3.04*  HCT 24.9* 25.9*  PLT 413 425    Recent Labs  06/27/16 0440 06/28/16 0420  NA 134* 136  K 3.2* 3.5  CL 106 106  CO2 23 24  BUN 23* 24*  CREATININE 0.73 0.86  GLUCOSE 42* 124*  CALCIUM 8.2* 7.8*   No results for input(s): LABPT, INR in the last 72 hours.  Physical Exam: Examination of the left shoulder is unchanged versus yesterday. Again, no significant swelling or erythema is noted over the anterior lateral aspects of the shoulder itself, and there does not appear to be any effusion of the shoulder joint. The area of swelling, erythema, and tenderness over the anterior, lateral, and posterior aspects of the upper arm is essentially unchanged, although there may be slight receding of the swelling and erythema from the line drawn around this area yesterday, suggesting possible improvement. She remains neurovascularly intact to the left upper extremity and hand.  X-rays: AP and lateral x-rays of the left humerus are obtained. These films demonstrated excellent position of the humeral and glenoid components without evidence of loosening. There is moderate swelling of the subcutaneous soft  tissues around the upper arm area without subcutaneous air. No other abnormalities are identified.  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  Plan: At this point, I would recommend continuation of the vancomycin and Zosyn with continued close observation of the shoulder and upper arm. She may be mobilized as symptoms permit with physical therapy. She may continue to receive appropriate pain medication as needed.   Cheryl SeltzerJohn J Mueller 06/29/2016, 2:17 PM

## 2016-06-29 NOTE — Progress Notes (Signed)
Spoke with Dr. Emmit PomfretHugelmeyer, may order c-diff specimen

## 2016-06-29 NOTE — Progress Notes (Signed)
Patient is A&O x4. Redness to LLE and lower back, noticeably less red since Thursday. Blood sugar was low this am, MD aware, making med adjustments.  Incont of urine and stool. C.Diff negative, isolation discontinued. Meds in applesauce. LBM, this shift. Good appetite, able to feed self with set up. Tolerated IV ABX. Oral pain meds given with good relief.

## 2016-06-29 NOTE — Progress Notes (Signed)
Text sent to on call Dr. For request for c-diff specimen. Pt has had 2 loose stools this shift.

## 2016-06-29 NOTE — Progress Notes (Signed)
Patient ID: Cheryl Mueller, female   DOB: 1941-06-23, 76 y.o.   MRN: [id number]  [employer] PROGRESS NOTE  Cheryl Mueller WJX:[id number] DOB: 07-Oct-1940 DOA: 06/26/2016 PCP: Curtis Sites III, MD  HPI/Subjective: Patient with pain in the left shoulder and her back. The patient has very little movement of her left shoulder. She has a history of left shoulder replacement.  Objective: Vitals:   06/29/16 0841 06/29/16 1536  BP: (!) 120/50 132/62  Pulse: 88 95  Resp: 18   Temp: 97.7 F (36.5 C) 99.3 F (37.4 C)    Filed Weights   06/26/16 1436 06/27/16 0504  Weight: 61.7 kg (136 lb) 56 kg (123 lb 6.4 oz)    ROS: Review of Systems  Constitutional: Negative for chills and fever.  Eyes: Negative for blurred vision.  Respiratory: Negative for cough and shortness of breath.   Cardiovascular: Negative for chest pain.  Gastrointestinal: Positive for diarrhea. Negative for abdominal pain, constipation, nausea and vomiting.  Genitourinary: Negative for dysuria.  Musculoskeletal: Positive for joint pain.  Neurological: Negative for dizziness and headaches.   Exam: Physical Exam  Constitutional: She is oriented to person, place, and time.  HENT:  Nose: No mucosal edema.  Mouth/Throat: No oropharyngeal exudate or posterior oropharyngeal edema.  Eyes: Conjunctivae, EOM and lids are normal. Pupils are equal, round, and reactive to light.  Neck: No JVD present. Carotid bruit is not present. No edema present. No thyroid mass and no thyromegaly present.  Cardiovascular: S1 normal and S2 normal.  Exam reveals no gallop.   No murmur heard. Pulses:      Dorsalis pedis pulses are 2+ on the right side, and 2+ on the left side.  Respiratory: No respiratory distress. She has decreased breath sounds in the right lower field and the left lower field. She has no wheezes. She has no rhonchi. She has no rales.  GI: Soft. Bowel sounds are normal. There is no tenderness.  Musculoskeletal:       Left  shoulder: She exhibits decreased range of motion, tenderness and swelling.       Right ankle: She exhibits no swelling.       Left ankle: She exhibits no swelling.  Lymphadenopathy:    She has no cervical adenopathy.  Neurological: She is alert and oriented to person, place, and time. No cranial nerve deficit.  Skin: Skin is warm. Nails show no clubbing.  Erythema and swelling left shoulder with limited motion. As per the nurse the erythema from yesterday is less. The patient still has a marking around the surrounding of the erythema and is less than yesterday. Rash on her back has healing scabs. It crosses the midline.  Psychiatric: She has a normal mood and affect.      Data Reviewed: Basic Metabolic Panel:  Recent Labs Lab 06/26/16 1439 06/27/16 0440 06/28/16 0420  NA 131* 134* 136  K 3.3* 3.2* 3.5  CL 93* 106 106  CO2 23 23 24   GLUCOSE 242* 42* 124*  BUN 29* 23* 24*  CREATININE 1.20* 0.73 0.86  CALCIUM 9.9 8.2* 7.8*   Liver Function Tests:  Recent Labs Lab 06/26/16 1439  AST 56*  ALT 26  ALKPHOS 132*  BILITOT <0.1*  PROT 6.8  ALBUMIN 2.9*   CBC:  Recent Labs Lab 06/26/16 1439 06/27/16 0440 06/28/16 0420 06/29/16 0308  WBC 28.2* 21.0* 17.3* 14.0*  NEUTROABS 26.7*  --   --   --   HGB 10.2* 8.7* 8.3* 8.7*  HCT 30.5* 25.8* 24.9* 25.9*  MCV 86.5 85.9 86.7 85.4  PLT 575* 430 413 425    CBG:  Recent Labs Lab 06/28/16 2110 06/29/16 0735 06/29/16 0823 06/29/16 0900 06/29/16 1104  GLUCAP 203* 49* 61* 117* 191*    Recent Results (from the past 240 hour(s))  Blood culture (routine x 2)     Status: None (Preliminary result)   Collection Time: 06/26/16  3:48 PM  Result Value Ref Range Status   Specimen Description BLOOD RIGHT AC  Final   Special Requests   Final    BOTTLES DRAWN AEROBIC AND ANAEROBIC AER ANA   Culture NO GROWTH 3 DAYS  Final   Report Status PENDING  Incomplete  Blood culture (routine x 2)     Status: None (Preliminary  result)   Collection Time: 06/26/16  4:27 PM  Result Value Ref Range Status   Specimen Description BLOOD LEFT FOREARM  Final   Special Requests   Final    BOTTLES DRAWN AEROBIC AND ANAEROBIC AER ANA   Culture NO GROWTH 3 DAYS  Final   Report Status PENDING  Incomplete  Urine culture     Status: Abnormal (Preliminary result)   Collection Time: 06/26/16  6:01 PM  Result Value Ref Range Status   Specimen Description URINE, CATHETERIZED  Final   Special Requests NONE  Final   Culture (A)  Final    >=100,000 COLONIES/mL ESCHERICHIA COLI retesting susceptibility Performed at [facility]    Report Status PENDING  Incomplete  MRSA PCR Screening     Status: None   Collection Time: 06/26/16  9:39 PM  Result Value Ref Range Status   MRSA by PCR NEGATIVE NEGATIVE Final    Comment:        The GeneXpert MRSA Assay (FDA approved for NASAL specimens only), is one component of a comprehensive MRSA colonization surveillance program. It is not intended to diagnose MRSA infection nor to guide or monitor treatment for MRSA infections.   C difficile quick scan w PCR reflex     Status: Abnormal   Collection Time: 06/29/16 11:20 AM  Result Value Ref Range Status   C Diff antigen POSITIVE (A) NEGATIVE Final   C Diff toxin NEGATIVE NEGATIVE Final   C Diff interpretation Results are indeterminate. See PCR results.  Final  Clostridium Difficile by PCR     Status: None   Collection Time: 06/29/16 11:20 AM  Result Value Ref Range Status   Toxigenic C Difficile by pcr NEGATIVE NEGATIVE Final    Comment: Patient is colonized with non toxigenic C. difficile. May not need treatment unless significant symptoms are present.     Studies: Dg Humerus Left  Result Date: 06/28/2016 CLINICAL DATA:  Left shoulder with with live with to redness and swelling EXAM: LEFT HUMERUS - 2+ VIEW COMPARISON:  04/16/2016, 04/05/2016, 04/10/2016 FINDINGS: Patient is status post left shoulder replacement.  Interim development of large amount of soft tissue swelling within the proximal arm. No soft tissue gas is present. Increased periosteal new bone formation within the humerus could relate to fracture and surgical healing. IMPRESSION: 1. Status post left shoulder replacement with grossly intact hardware 2. Progressive periosteal new bone formation at the shaft of the humerus may represent progressive healing of fracture and surgical change 3. Interim development of large amount of soft tissue swelling within the left arm, especially involving the upper arm. Electronically Signed   By: Jasmine Pang M.D.   On:  06/28/2016 19:33    Scheduled Meds: . amLODipine  10 mg Oral Daily  . atorvastatin  80 mg Oral QHS  . enoxaparin (LOVENOX) injection  40 mg Subcutaneous Q24H  . glimepiride  4 mg Oral Q breakfast  . insulin aspart  0-9 Units Subcutaneous TID WC & HS  . lidocaine  1 patch Transdermal Q12H  . linagliptin  5 mg Oral Daily  . metFORMIN  1,000 mg Oral BID WC  . piperacillin-tazobactam (ZOSYN)  IV  3.375 g Intravenous Q8H  . potassium chloride  10 mEq Oral Daily  . rOPINIRole  0.25 mg Oral QHS  . traZODone  25 mg Oral QHS  . vancomycin  500 mg Intravenous Q24H  . venlafaxine XR  75 mg Oral Daily    Assessment/Plan:  1. Left upper extremity cellulitis with recent history of left shoulder replacement. Continue IV vancomycin and Zosyn. Await ID recommendations on Monday about whether or not longer term antibiotics IV are needed or whether can switch over to oral antibiotics. 2. Hypoglycemia in the mornings. Decrease Amaryl 1 mg in whole discharge on 10 metformin at this time. 3. Weakness. Physical therapy recommends rehabilitation 4. Essential hypertension on Norvasc 5. Hyperlipidemia unspecified on atorvastatin 6. Depression on Effexor  Code Status:     Code Status Orders        Start     Ordered   06/26/16 2031  Full code  Continuous     06/26/16 2031    Code Status History     Date Active Date Inactive Code Status Order ID Comments User Context   04/16/2016 12:08 PM 04/17/2016  6:41 PM Full Code [id number]  Christena FlakeJohn J Poggi, MD Inpatient   10/13/2015  5:09 PM 10/18/2015  2:35 PM Full Code [id number]  Enid Baasadhika Kalisetti, MD Inpatient   04/17/2015  4:30 PM 04/21/2015  2:23 PM Full Code [id number]  Lynnea FerrierBert J Klein III, MD Inpatient   03/23/2015  2:27 PM 03/23/2015  5:50 PM Full Code [id number]  Kennedy BuckerMichael Menz, MD Inpatient   11/26/2012  4:41 PM 11/27/2012  1:02 PM Full Code [id number]  Hewitt Shortsobert W Nudelman, MD Inpatient     Family Communication: Son at the bedside Disposition Plan: Will need rehabilitation once infection improved  Consultants:  Infectious disease  Orthopedic surgery  Antibiotics:  Vancomycin   Zosyn   Time spent: 28 minutes  Alford HighlandWIETING, Whit Bruni  [employer]

## 2016-06-30 LAB — BLOOD CULTURE ID PANEL (REFLEXED)
Acinetobacter baumannii: NOT DETECTED
CANDIDA KRUSEI: NOT DETECTED
CANDIDA PARAPSILOSIS: NOT DETECTED
CARBAPENEM RESISTANCE: NOT DETECTED
Candida albicans: NOT DETECTED
Candida glabrata: NOT DETECTED
Candida tropicalis: NOT DETECTED
ENTEROCOCCUS SPECIES: NOT DETECTED
Enterobacter cloacae complex: NOT DETECTED
Enterobacteriaceae species: DETECTED — AB
Escherichia coli: DETECTED — AB
HAEMOPHILUS INFLUENZAE: NOT DETECTED
KLEBSIELLA OXYTOCA: NOT DETECTED
Klebsiella pneumoniae: NOT DETECTED
Listeria monocytogenes: NOT DETECTED
Neisseria meningitidis: NOT DETECTED
Proteus species: NOT DETECTED
Pseudomonas aeruginosa: NOT DETECTED
SERRATIA MARCESCENS: NOT DETECTED
STAPHYLOCOCCUS AUREUS BCID: NOT DETECTED
STAPHYLOCOCCUS SPECIES: NOT DETECTED
Streptococcus agalactiae: NOT DETECTED
Streptococcus pneumoniae: NOT DETECTED
Streptococcus pyogenes: NOT DETECTED
Streptococcus species: NOT DETECTED

## 2016-06-30 LAB — GLUCOSE, CAPILLARY
GLUCOSE-CAPILLARY: 101 mg/dL — AB (ref 65–99)
GLUCOSE-CAPILLARY: 249 mg/dL — AB (ref 65–99)
Glucose-Capillary: 273 mg/dL — ABNORMAL HIGH (ref 65–99)
Glucose-Capillary: 218 mg/dL — ABNORMAL HIGH (ref 65–99)

## 2016-06-30 LAB — CREATININE, SERUM: CREATININE: 0.49 mg/dL (ref 0.44–1.00)

## 2016-06-30 LAB — URINE CULTURE

## 2016-06-30 NOTE — Progress Notes (Signed)
PHARMACY - PHYSICIAN COMMUNICATION CRITICAL VALUE ALERT - BLOOD CULTURE IDENTIFICATION (BCID)  Results for orders placed or performed during the hospital encounter of 06/26/16  Blood Culture ID Panel (Reflexed) (Collected: 06/26/2016  3:48 PM)  Result Value Ref Range   Enterococcus species NOT DETECTED NOT DETECTED   Listeria monocytogenes NOT DETECTED NOT DETECTED   Staphylococcus species NOT DETECTED NOT DETECTED   Staphylococcus aureus NOT DETECTED NOT DETECTED   Streptococcus species NOT DETECTED NOT DETECTED   Streptococcus agalactiae NOT DETECTED NOT DETECTED   Streptococcus pneumoniae NOT DETECTED NOT DETECTED   Streptococcus pyogenes NOT DETECTED NOT DETECTED   Acinetobacter baumannii NOT DETECTED NOT DETECTED   Enterobacteriaceae species DETECTED (A) NOT DETECTED   Enterobacter cloacae complex NOT DETECTED NOT DETECTED   Escherichia coli DETECTED (A) NOT DETECTED   Klebsiella oxytoca NOT DETECTED NOT DETECTED   Klebsiella pneumoniae NOT DETECTED NOT DETECTED   Proteus species NOT DETECTED NOT DETECTED   Serratia marcescens NOT DETECTED NOT DETECTED   Carbapenem resistance NOT DETECTED NOT DETECTED   Haemophilus influenzae NOT DETECTED NOT DETECTED   Neisseria meningitidis NOT DETECTED NOT DETECTED   Pseudomonas aeruginosa NOT DETECTED NOT DETECTED   Candida albicans NOT DETECTED NOT DETECTED   Candida glabrata NOT DETECTED NOT DETECTED   Candida krusei NOT DETECTED NOT DETECTED   Candida parapsilosis NOT DETECTED NOT DETECTED   Candida tropicalis NOT DETECTED NOT DETECTED    Name of physician (or Provider) Contacted: Dr. Renae GlossWieting  Changes to prescribed antibiotics required: keep all abx the same. Pt currently on vanc and zosyn. Has pan sensitive UTI. Pt also has swelling, erythema and tenderness over anterior, lateral and posterior aspects of upper arm. Await recommendations from ID per Dr. Hilton SinclairWeiting.  Olene FlossMelissa D Lashell Moffitt, Pharm.D, BCPS Clinical Pharmacist  06/30/2016  11:20  AM

## 2016-06-30 NOTE — Progress Notes (Addendum)
Patient is A&O x4. Incont of urine and stool. Scabs to chest and lower back. Redness to LUE is outlined. Oral pain meds x 6 hours, with good relief. Needs strong encouragement. Good appetite. Bed alarm on for safety. Takes meds whole in applesauce.

## 2016-06-30 NOTE — Progress Notes (Signed)
Subjective: Patient notes moderate improvement in her shoulder discomfort as compared to yesterday.   Objective: Vital signs in last 24 hours: Temp:  [97.6 F (36.4 C)-99.5 F (37.5 C)] 97.6 F (36.4 C) (01/07 1409) Pulse Rate:  [87-98] 87 (01/07 1409) Resp:  [16-18] 18 (01/07 1409) BP: (123-156)/(58-67) 123/60 (01/07 1409) SpO2:  [92 %-96 %] 96 % (01/07 1409)  Intake/Output from previous day: 01/06 0701 - 01/07 0700 In: 510 [P.O.:360; IV Piggyback:150] Out: -  Intake/Output this shift: Total I/O In: 720 [P.O.:720] Out: -    Recent Labs  06/28/16 0420 06/29/16 0308  HGB 8.3* 8.7*    Recent Labs  06/28/16 0420 06/29/16 0308  WBC 17.3* 14.0*  RBC 2.88* 3.04*  HCT 24.9* 25.9*  PLT 413 425    Recent Labs  06/28/16 0420 06/30/16 1352  NA 136  --   K 3.5  --   CL 106  --   CO2 24  --   BUN 24*  --   CREATININE 0.86 0.49  GLUCOSE 124*  --   CALCIUM 7.8*  --    No results for input(s): LABPT, INR in the last 72 hours.  Physical Exam: Examination of the left shoulder demonstrates moderate improvement in the area of erythema and induration noted over the lateral and posterior lateral aspects of the left upper arm and shoulder as compared to yesterday's examination. She remains neurovascularly intact to the left upper extremity and hand.  Assessment: Left upper extremity cellulitis, improving clinically and symptomatically.  Plan: Continue present IV antibiotic regimen and pain medication as indicated. Patient may be mobilized with physical therapy, getting out of bed to the chair and ambulating in the halls with assistance.   Excell SeltzerJohn J Faelynn Wynder 06/30/2016, 3:47 PM

## 2016-06-30 NOTE — Progress Notes (Signed)
Patient ID: Cheryl Mueller, female   DOB: 06-Mar-1941, 76 y.o.   MRN: [id number]  [facility] PROGRESS NOTE  Cheryl Mueller:[id number] DOB: Mar 13, 1941 DOA: 06/26/2016 PCP: Curtis Sites III, MD  HPI/Subjective: Patient still with pain and swelling left shoulder. Erythema little bit better but still very limited movement of the left shoulder. Patient still with some diarrhea. Still very weak.  Objective: Vitals:   06/30/16 0730 06/30/16 1409  BP: (!) 150/58 123/60  Pulse: 88 87  Resp: 16 18  Temp: 98.2 F (36.8 C) 97.6 F (36.4 C)    Filed Weights   06/26/16 1436 06/27/16 0504  Weight: 61.7 kg (136 lb) 56 kg (123 lb 6.4 oz)    ROS: Review of Systems  Constitutional: Negative for chills and fever.  Eyes: Negative for blurred vision.  Respiratory: Negative for cough and shortness of breath.   Cardiovascular: Negative for chest pain.  Gastrointestinal: Positive for diarrhea. Negative for abdominal pain, constipation, nausea and vomiting.  Genitourinary: Negative for dysuria.  Musculoskeletal: Positive for joint pain.  Neurological: Negative for dizziness and headaches.   Exam: Physical Exam  Constitutional: She is oriented to person, place, and time.  HENT:  Nose: No mucosal edema.  Mouth/Throat: No oropharyngeal exudate or posterior oropharyngeal edema.  Eyes: Conjunctivae, EOM and lids are normal. Pupils are equal, round, and reactive to light.  Neck: No JVD present. Carotid bruit is not present. No edema present. No thyroid mass and no thyromegaly present.  Cardiovascular: S1 normal and S2 normal.  Exam reveals no gallop.   No murmur heard. Pulses:      Dorsalis pedis pulses are 2+ on the right side, and 2+ on the left side.  Respiratory: No respiratory distress. She has decreased breath sounds in the right lower field and the left lower field. She has no wheezes. She has no rhonchi. She has no rales.  GI: Soft. Bowel sounds are normal. There is no tenderness.   Musculoskeletal:       Left shoulder: She exhibits decreased range of motion, tenderness and swelling.       Right ankle: She exhibits no swelling.       Left ankle: She exhibits no swelling.  Lymphadenopathy:    She has no cervical adenopathy.  Neurological: She is alert and oriented to person, place, and time. No cranial nerve deficit.  Skin: Skin is warm. Nails show no clubbing.  Erythema and swelling left shoulder with limited motion. As per the nurse the erythema from yesterday is less. The patient still has a marking around the surrounding of the erythema and is less than yesterday. Rash on her back has healing scabs. It crosses the midline.  Psychiatric: She has a normal mood and affect.      Data Reviewed: Basic Metabolic Panel:  Recent Labs Lab 06/26/16 1439 06/27/16 0440 06/28/16 0420 06/30/16 1352  NA 131* 134* 136  --   K 3.3* 3.2* 3.5  --   CL 93* 106 106  --   CO2 23 23 24   --   GLUCOSE 242* 42* 124*  --   BUN 29* 23* 24*  --   CREATININE 1.20* 0.73 0.86 0.49  CALCIUM 9.9 8.2* 7.8*  --    Liver Function Tests:  Recent Labs Lab 06/26/16 1439  AST 56*  ALT 26  ALKPHOS 132*  BILITOT <0.1*  PROT 6.8  ALBUMIN 2.9*   CBC:  Recent Labs Lab 06/26/16 1439 06/27/16 0440 06/28/16 0420 06/29/16 0308  WBC 28.2* 21.0* 17.3* 14.0*  NEUTROABS 26.7*  --   --   --   HGB 10.2* 8.7* 8.3* 8.7*  HCT 30.5* 25.8* 24.9* 25.9*  MCV 86.5 85.9 86.7 85.4  PLT 575* 430 413 425    CBG:  Recent Labs Lab 06/29/16 1104 06/29/16 1623 06/29/16 2139 06/30/16 0800 06/30/16 1117  GLUCAP 191* 236* 230* 101* 273*    Recent Results (from the past 240 hour(s))  Blood culture (routine x 2)     Status: None (Preliminary result)   Collection Time: 06/26/16  3:48 PM  Result Value Ref Range Status   Specimen Description BLOOD RIGHT AC  Final   Special Requests   Final    BOTTLES DRAWN AEROBIC AND ANAEROBIC AER 13ML ANA 10ML   Culture  Setup Time   Final    Organism ID  to follow GRAM NEGATIVE RODS AEROBIC BOTTLE ONLY CRITICAL RESULT CALLED TO, READ BACK BY AND VERIFIED WITH: MELLISSA MACCIA ON 06/30/16 AT 1103 [facility]    Culture GRAM NEGATIVE RODS  Final   Report Status PENDING  Incomplete  Blood Culture ID Panel (Reflexed)     Status: Abnormal   Collection Time: 06/26/16  3:48 PM  Result Value Ref Range Status   Enterococcus species NOT DETECTED NOT DETECTED Final   Listeria monocytogenes NOT DETECTED NOT DETECTED Final   Staphylococcus species NOT DETECTED NOT DETECTED Final   Staphylococcus aureus NOT DETECTED NOT DETECTED Final   Streptococcus species NOT DETECTED NOT DETECTED Final   Streptococcus agalactiae NOT DETECTED NOT DETECTED Final   Streptococcus pneumoniae NOT DETECTED NOT DETECTED Final   Streptococcus pyogenes NOT DETECTED NOT DETECTED Final   Acinetobacter baumannii NOT DETECTED NOT DETECTED Final   Enterobacteriaceae species DETECTED (A) NOT DETECTED Final    Comment: CRITICAL RESULT CALLED TO, READ BACK BY AND VERIFIED WITH: MELLISSA MACCIA ON 06/30/16 AT 1103 [facility]    Enterobacter cloacae complex NOT DETECTED NOT DETECTED Final   Escherichia coli DETECTED (A) NOT DETECTED Final    Comment: CRITICAL RESULT CALLED TO, READ BACK BY AND VERIFIED WITH: MELLISSA MACCIA ON 06/30/16 AT 1103 [facility]    Klebsiella oxytoca NOT DETECTED NOT DETECTED Final   Klebsiella pneumoniae NOT DETECTED NOT DETECTED Final   Proteus species NOT DETECTED NOT DETECTED Final   Serratia marcescens NOT DETECTED NOT DETECTED Final   Carbapenem resistance NOT DETECTED NOT DETECTED Final   Haemophilus influenzae NOT DETECTED NOT DETECTED Final   Neisseria meningitidis NOT DETECTED NOT DETECTED Final   Pseudomonas aeruginosa NOT DETECTED NOT DETECTED Final   Candida albicans NOT DETECTED NOT DETECTED Final   Candida glabrata NOT DETECTED NOT DETECTED Final   Candida krusei NOT DETECTED NOT DETECTED Final   Candida parapsilosis NOT DETECTED NOT DETECTED Final   Candida  tropicalis NOT DETECTED NOT DETECTED Final  Blood culture (routine x 2)     Status: None (Preliminary result)   Collection Time: 06/26/16  4:27 PM  Result Value Ref Range Status   Specimen Description BLOOD LEFT FOREARM  Final   Special Requests   Final    BOTTLES DRAWN AEROBIC AND ANAEROBIC AER 11ML ANA 1ML   Culture NO GROWTH 4 DAYS  Final   Report Status PENDING  Incomplete  Urine culture     Status: Abnormal   Collection Time: 06/26/16  6:01 PM  Result Value Ref Range Status   Specimen Description URINE, CATHETERIZED  Final   Special Requests NONE  Final   Culture >=100,000 COLONIES/mL  ESCHERICHIA COLI (A)  Final   Report Status 06/30/2016 FINAL  Final   Organism ID, Bacteria ESCHERICHIA COLI (A)  Final      Susceptibility   Escherichia coli - MIC*    AMPICILLIN <=2 SENSITIVE Sensitive     CEFAZOLIN <=4 SENSITIVE Sensitive     CEFTRIAXONE <=1 SENSITIVE Sensitive     CIPROFLOXACIN <=0.25 SENSITIVE Sensitive     GENTAMICIN <=1 SENSITIVE Sensitive     IMIPENEM <=0.25 SENSITIVE Sensitive     NITROFURANTOIN <=16 SENSITIVE Sensitive     TRIMETH/SULFA <=20 SENSITIVE Sensitive     AMPICILLIN/SULBACTAM <=2 SENSITIVE Sensitive     PIP/TAZO <=4 SENSITIVE Sensitive     Extended ESBL NEGATIVE Sensitive     * >=100,000 COLONIES/mL ESCHERICHIA COLI  MRSA PCR Screening     Status: None   Collection Time: 06/26/16  9:39 PM  Result Value Ref Range Status   MRSA by PCR NEGATIVE NEGATIVE Final    Comment:        The GeneXpert MRSA Assay (FDA approved for NASAL specimens only), is one component of a comprehensive MRSA colonization surveillance program. It is not intended to diagnose MRSA infection nor to guide or monitor treatment for MRSA infections.   C difficile quick scan w PCR reflex     Status: Abnormal   Collection Time: 06/29/16 11:20 AM  Result Value Ref Range Status   C Diff antigen POSITIVE (A) NEGATIVE Final   C Diff toxin NEGATIVE NEGATIVE Final   C Diff interpretation  Results are indeterminate. See PCR results.  Final  Clostridium Difficile by PCR     Status: None   Collection Time: 06/29/16 11:20 AM  Result Value Ref Range Status   Toxigenic C Difficile by pcr NEGATIVE NEGATIVE Final    Comment: Patient is colonized with non toxigenic C. difficile. May not need treatment unless significant symptoms are present.     Studies: Dg Humerus Left  Result Date: 06/28/2016 CLINICAL DATA:  Left shoulder with with live with to redness and swelling EXAM: LEFT HUMERUS - 2+ VIEW COMPARISON:  04/16/2016, 04/05/2016, 04/10/2016 FINDINGS: Patient is status post left shoulder replacement. Interim development of large amount of soft tissue swelling within the proximal arm. No soft tissue gas is present. Increased periosteal new bone formation within the humerus could relate to fracture and surgical healing. IMPRESSION: 1. Status post left shoulder replacement with grossly intact hardware 2. Progressive periosteal new bone formation at the shaft of the humerus may represent progressive healing of fracture and surgical change 3. Interim development of large amount of soft tissue swelling within the left arm, especially involving the upper arm. Electronically Signed   By: Jasmine Pang M.D.   On: 06/28/2016 19:33    Scheduled Meds: . amLODipine  10 mg Oral Daily  . atorvastatin  80 mg Oral QHS  . enoxaparin (LOVENOX) injection  40 mg Subcutaneous Q24H  . glimepiride  1 mg Oral Q breakfast  . insulin aspart  0-9 Units Subcutaneous TID WC & HS  . lidocaine  1 patch Transdermal Q12H  . piperacillin-tazobactam (ZOSYN)  IV  3.375 g Intravenous Q8H  . potassium chloride  10 mEq Oral Daily  . rOPINIRole  0.25 mg Oral QHS  . traZODone  25 mg Oral QHS  . vancomycin  500 mg Intravenous Q24H  . venlafaxine XR  75 mg Oral Daily    Assessment/Plan:  1. Gram negative rod bacteremia and Left upper extremity cellulitis with recent history of left  shoulder replacement. Continue IV  vancomycin and Zosyn. Await ID recommendations on Monday about whether or not longer term antibiotics IV are needed and or whether the shoulder replacement and needs to come out. 2. Acute cystitis with hematuria. Escherichia coli growing in urine. 3. Hypoglycemia in the mornings. Decrease Amaryl 1 mg in whole discharge on 10 metformin at this time. 4. Weakness. Physical therapy recommends rehabilitation 5. Essential hypertension on Norvasc 6. Hyperlipidemia unspecified on atorvastatin 7. Depression on Effexor  Code Status:     Code Status Orders        Start     Ordered   06/26/16 2031  Full code  Continuous     06/26/16 2031    Code Status History    Date Active Date Inactive Code Status Order ID Comments User Context   04/16/2016 12:08 PM 04/17/2016  6:41 PM Full Code [id number]  Christena Flake, MD Inpatient   10/13/2015  5:09 PM 10/18/2015  2:35 PM Full Code [id number]  Enid Baas, MD Inpatient   04/17/2015  4:30 PM 04/21/2015  2:23 PM Full Code [id number]  Lynnea Ferrier, MD Inpatient   03/23/2015  2:27 PM 03/23/2015  5:50 PM Full Code [id number]  Kennedy Bucker, MD Inpatient   11/26/2012  4:41 PM 11/27/2012  1:02 PM Full Code [id number]  Hewitt Shorts, MD Inpatient     Family Communication: Son yesterday Disposition Plan: Will need rehabilitation once infection improved  Consultants:  Infectious disease  Orthopedic surgery  Antibiotics:  Vancomycin   Zosyn   Time spent: 25 minutes  Alford Highland  [employer]

## 2016-07-01 ENCOUNTER — Inpatient Hospital Stay: Payer: Medicare Other | Admitting: Anesthesiology

## 2016-07-01 ENCOUNTER — Inpatient Hospital Stay: Payer: Medicare Other

## 2016-07-01 ENCOUNTER — Encounter
Admission: EM | Disposition: A | Discharge status: Home / Self Care | Payer: Self-pay | Source: Home / Self Care | Attending: Internal Medicine

## 2016-07-01 HISTORY — PX: APPLICATION OF WOUND VAC: SHX5189

## 2016-07-01 HISTORY — PX: IRRIGATION AND DEBRIDEMENT SHOULDER: SHX5880

## 2016-07-01 LAB — BASIC METABOLIC PANEL
Anion gap: 6 (ref 5–15)
BUN: 9 mg/dL (ref 6–20)
CALCIUM: 7.8 mg/dL — AB (ref 8.9–10.3)
CO2: 28 mmol/L (ref 22–32)
CREATININE: 0.56 mg/dL (ref 0.44–1.00)
Chloride: 104 mmol/L (ref 101–111)
Glucose, Bld: 147 mg/dL — ABNORMAL HIGH (ref 65–99)
Potassium: 2.7 mmol/L — CL (ref 3.5–5.1)
SODIUM: 138 mmol/L (ref 135–145)

## 2016-07-01 LAB — CBC
HCT: 27 % — ABNORMAL LOW (ref 35.0–47.0)
HEMOGLOBIN: 9.1 g/dL — AB (ref 12.0–16.0)
MCH: 29.2 pg (ref 26.0–34.0)
MCHC: 33.8 g/dL (ref 32.0–36.0)
MCV: 86.3 fL (ref 80.0–100.0)
PLATELETS: 384 10*3/uL (ref 150–440)
RBC: 3.13 MIL/uL — ABNORMAL LOW (ref 3.80–5.20)
RDW: 13.7 % (ref 11.5–14.5)
WBC: 10.3 10*3/uL (ref 3.6–11.0)

## 2016-07-01 LAB — CULTURE, BLOOD (ROUTINE X 2): CULTURE: NO GROWTH

## 2016-07-01 LAB — GLUCOSE, CAPILLARY
GLUCOSE-CAPILLARY: 133 mg/dL — AB (ref 65–99)
GLUCOSE-CAPILLARY: 151 mg/dL — AB (ref 65–99)
Glucose-Capillary: 150 mg/dL — ABNORMAL HIGH (ref 65–99)
Glucose-Capillary: 121 mg/dL — ABNORMAL HIGH (ref 65–99)
Glucose-Capillary: 309 mg/dL — ABNORMAL HIGH (ref 65–99)

## 2016-07-01 LAB — MAGNESIUM: MAGNESIUM: 1.2 mg/dL — AB (ref 1.7–2.4)

## 2016-07-01 LAB — POTASSIUM
Potassium: 2.9 mmol/L — ABNORMAL LOW (ref 3.5–5.1)
Potassium: 3.5 mmol/L (ref 3.5–5.1)

## 2016-07-01 SURGERY — IRRIGATION AND DEBRIDEMENT SHOULDER
Anesthesia: Choice | Wound class: Dirty or Infected

## 2016-07-01 MED ORDER — DEXAMETHASONE SODIUM PHOSPHATE 10 MG/ML IJ SOLN
INTRAMUSCULAR | Status: DC | PRN
  Administered 2016-07-01: 5 mg via INTRAVENOUS

## 2016-07-01 MED ORDER — HYDROMORPHONE HCL 1 MG/ML IJ SOLN: 0.5000 mg | INTRAMUSCULAR | Status: DC | PRN

## 2016-07-01 MED ORDER — LIDOCAINE 2% (20 MG/ML) 5 ML SYRINGE
INTRAMUSCULAR | Status: AC
  Filled 2016-07-01: qty 5

## 2016-07-01 MED ORDER — FERROUS SULFATE 325 (65 FE) MG PO TABS
325.0000 mg | ORAL_TABLET | Freq: Three times a day (TID) | ORAL | Status: DC
  Administered 2016-07-01 – 2016-07-03 (×5): 325 mg via ORAL
  Filled 2016-07-01 (×5): qty 1

## 2016-07-01 MED ORDER — METOCLOPRAMIDE HCL 5 MG/ML IJ SOLN: 5.0000 mg | Freq: Three times a day (TID) | INTRAMUSCULAR | Status: DC | PRN

## 2016-07-01 MED ORDER — MAGNESIUM HYDROXIDE 400 MG/5ML PO SUSP: 30.0000 mL | Freq: Every day | ORAL | Status: DC | PRN

## 2016-07-01 MED ORDER — FENTANYL CITRATE (PF) 100 MCG/2ML IJ SOLN
INTRAMUSCULAR | Status: AC
  Filled 2016-07-01: qty 2

## 2016-07-01 MED ORDER — POTASSIUM CHLORIDE 2 MEQ/ML IV SOLN
30.0000 meq | Freq: Once | INTRAVENOUS | Status: DC
  Filled 2016-07-01: qty 15

## 2016-07-01 MED ORDER — ONDANSETRON HCL 4 MG/2ML IJ SOLN
INTRAMUSCULAR | Status: AC
  Filled 2016-07-01: qty 2

## 2016-07-01 MED ORDER — DOCUSATE SODIUM 100 MG PO CAPS
100.0000 mg | ORAL_CAPSULE | Freq: Two times a day (BID) | ORAL | Status: DC
  Administered 2016-07-01 – 2016-07-02 (×3): 100 mg via ORAL
  Filled 2016-07-01 (×3): qty 1

## 2016-07-01 MED ORDER — ONDANSETRON HCL 4 MG/2ML IJ SOLN: 4.0000 mg | Freq: Four times a day (QID) | INTRAMUSCULAR | Status: DC | PRN

## 2016-07-01 MED ORDER — IOPAMIDOL (ISOVUE-300) INJECTION 61%
75.0000 mL | Freq: Once | INTRAVENOUS | Status: AC | PRN
  Administered 2016-07-01: 75 mL via INTRAVENOUS

## 2016-07-01 MED ORDER — FENTANYL CITRATE (PF) 100 MCG/2ML IJ SOLN
25.0000 ug | INTRAMUSCULAR | Status: DC | PRN
  Administered 2016-07-01: 50 ug via INTRAVENOUS
  Administered 2016-07-01 (×4): 25 ug via INTRAVENOUS

## 2016-07-01 MED ORDER — SODIUM CHLORIDE 0.9 % IV SOLN
INTRAVENOUS | Status: DC
Start: 2016-07-01 — End: 2016-07-01
  Filled 2016-07-01 (×2): qty 1000

## 2016-07-01 MED ORDER — ROCURONIUM BROMIDE 50 MG/5ML IV SOSY
PREFILLED_SYRINGE | INTRAVENOUS | Status: AC
  Filled 2016-07-01: qty 5

## 2016-07-01 MED ORDER — DEXAMETHASONE SODIUM PHOSPHATE 10 MG/ML IJ SOLN
INTRAMUSCULAR | Status: AC
  Filled 2016-07-01: qty 1

## 2016-07-01 MED ORDER — BISACODYL 10 MG RE SUPP: 10.0000 mg | Freq: Every day | RECTAL | Status: DC | PRN

## 2016-07-01 MED ORDER — PROPOFOL 10 MG/ML IV BOLUS
INTRAVENOUS | Status: AC
  Filled 2016-07-01: qty 20

## 2016-07-01 MED ORDER — POTASSIUM CHLORIDE IN NACL 40-0.9 MEQ/L-% IV SOLN
INTRAVENOUS | Status: DC
  Administered 2016-07-01: 100 mL/h via INTRAVENOUS
  Filled 2016-07-01 (×3): qty 1000

## 2016-07-01 MED ORDER — LIDOCAINE HCL (CARDIAC) 20 MG/ML IV SOLN
INTRAVENOUS | Status: DC | PRN
  Administered 2016-07-01: 80 mg via INTRAVENOUS

## 2016-07-01 MED ORDER — PROPOFOL 10 MG/ML IV BOLUS
INTRAVENOUS | Status: DC | PRN
  Administered 2016-07-01: 50 mg via INTRAVENOUS
  Administered 2016-07-01: 30 mg via INTRAVENOUS
  Administered 2016-07-01: 120 mg via INTRAVENOUS

## 2016-07-01 MED ORDER — GLYCOPYRROLATE 0.2 MG/ML IJ SOLN
INTRAMUSCULAR | Status: AC
  Filled 2016-07-01: qty 1

## 2016-07-01 MED ORDER — SUGAMMADEX SODIUM 200 MG/2ML IV SOLN
INTRAVENOUS | Status: AC
  Filled 2016-07-01: qty 2

## 2016-07-01 MED ORDER — NEOMYCIN-POLYMYXIN B GU 40-200000 IR SOLN
Status: DC | PRN
  Administered 2016-07-01: 16 mL

## 2016-07-01 MED ORDER — ONDANSETRON HCL 4 MG/2ML IJ SOLN
4.0000 mg | Freq: Once | INTRAMUSCULAR | Status: DC | PRN
Start: 2016-07-01 — End: 2016-07-01

## 2016-07-01 MED ORDER — ROCURONIUM BROMIDE 100 MG/10ML IV SOLN
INTRAVENOUS | Status: DC | PRN
  Administered 2016-07-01: 20 mg via INTRAVENOUS
  Administered 2016-07-01: 10 mg via INTRAVENOUS

## 2016-07-01 MED ORDER — SUGAMMADEX SODIUM 200 MG/2ML IV SOLN
INTRAVENOUS | Status: DC | PRN
  Administered 2016-07-01: 200 mg via INTRAVENOUS

## 2016-07-01 MED ORDER — MIDAZOLAM HCL 2 MG/2ML IJ SOLN
INTRAMUSCULAR | Status: DC | PRN
  Administered 2016-07-01 (×2): 1 mg via INTRAVENOUS

## 2016-07-01 MED ORDER — FENTANYL CITRATE (PF) 100 MCG/2ML IJ SOLN
INTRAMUSCULAR | Status: DC | PRN
  Administered 2016-07-01 (×4): 50 ug via INTRAVENOUS

## 2016-07-01 MED ORDER — SUCCINYLCHOLINE CHLORIDE 20 MG/ML IJ SOLN
INTRAMUSCULAR | Status: DC | PRN
  Administered 2016-07-01: 80 mg via INTRAVENOUS

## 2016-07-01 MED ORDER — ONDANSETRON HCL 4 MG PO TABS: 4.0000 mg | ORAL_TABLET | Freq: Four times a day (QID) | ORAL | Status: DC | PRN

## 2016-07-01 MED ORDER — VANCOMYCIN HCL 1000 MG IV SOLR
INTRAVENOUS | Status: DC | PRN
  Administered 2016-07-01: 1000 mg

## 2016-07-01 MED ORDER — ENOXAPARIN SODIUM 40 MG/0.4ML ~~LOC~~ SOLN
40.0000 mg | SUBCUTANEOUS | Status: DC
  Administered 2016-07-02 – 2016-07-03 (×2): 40 mg via SUBCUTANEOUS
  Filled 2016-07-01 (×2): qty 0.4

## 2016-07-01 MED ORDER — POTASSIUM CHLORIDE CRYS ER 20 MEQ PO TBCR
40.0000 meq | EXTENDED_RELEASE_TABLET | Freq: Once | ORAL | Status: AC
  Administered 2016-07-01: 40 meq via ORAL

## 2016-07-01 MED ORDER — MAGNESIUM SULFATE 4 GM/100ML IV SOLN
4.0000 g | Freq: Once | INTRAVENOUS | Status: AC
  Administered 2016-07-01: 4 g via INTRAVENOUS
  Filled 2016-07-01: qty 100

## 2016-07-01 MED ORDER — SODIUM CHLORIDE 0.9 % IV SOLN
INTRAVENOUS | Status: DC
  Administered 2016-07-01: 14:00:00 via INTRAVENOUS

## 2016-07-01 MED ORDER — FLEET ENEMA 7-19 GM/118ML RE ENEM: 1.0000 | ENEMA | Freq: Once | RECTAL | Status: DC | PRN

## 2016-07-01 MED ORDER — PHENYLEPHRINE 40 MCG/ML (10ML) SYRINGE FOR IV PUSH (FOR BLOOD PRESSURE SUPPORT)
PREFILLED_SYRINGE | INTRAVENOUS | Status: AC
  Filled 2016-07-01: qty 10

## 2016-07-01 MED ORDER — PHENYLEPHRINE HCL 10 MG/ML IJ SOLN
INTRAMUSCULAR | Status: DC | PRN
  Administered 2016-07-01 (×4): 120 ug via INTRAVENOUS

## 2016-07-01 MED ORDER — ACETAMINOPHEN 500 MG PO TABS
1000.0000 mg | ORAL_TABLET | Freq: Four times a day (QID) | ORAL | Status: AC
  Administered 2016-07-01 – 2016-07-02 (×4): 1000 mg via ORAL
  Filled 2016-07-01 (×4): qty 2

## 2016-07-01 MED ORDER — POTASSIUM CHLORIDE CRYS ER 20 MEQ PO TBCR
40.0000 meq | EXTENDED_RELEASE_TABLET | Freq: Once | ORAL | Status: AC
  Administered 2016-07-01: 40 meq via ORAL
  Filled 2016-07-01: qty 2

## 2016-07-01 MED ORDER — DIPHENHYDRAMINE HCL 12.5 MG/5ML PO ELIX: 12.5000 mg | ORAL_SOLUTION | ORAL | Status: DC | PRN

## 2016-07-01 MED ORDER — POTASSIUM CHLORIDE CRYS ER 20 MEQ PO TBCR: 40.0000 meq | EXTENDED_RELEASE_TABLET | Freq: Once | ORAL | Status: DC

## 2016-07-01 MED ORDER — GENTAMICIN SULFATE 40 MG/ML IJ SOLN
INTRAMUSCULAR | Status: DC | PRN
  Administered 2016-07-01: 80 mg

## 2016-07-01 MED ORDER — ACETAMINOPHEN 650 MG RE SUPP: 650.0000 mg | Freq: Four times a day (QID) | RECTAL | Status: DC | PRN

## 2016-07-01 MED ORDER — OXYCODONE HCL 5 MG PO TABS
5.0000 mg | ORAL_TABLET | ORAL | Status: DC | PRN
  Administered 2016-07-01 (×2): 5 mg via ORAL
  Administered 2016-07-02: 10 mg via ORAL
  Administered 2016-07-02: 5 mg via ORAL
  Filled 2016-07-01 (×3): qty 1
  Filled 2016-07-01: qty 2

## 2016-07-01 MED ORDER — POTASSIUM CHLORIDE CRYS ER 20 MEQ PO TBCR
40.0000 meq | EXTENDED_RELEASE_TABLET | Freq: Once | ORAL | Status: DC
  Filled 2016-07-01: qty 2

## 2016-07-01 MED ORDER — MIDAZOLAM HCL 2 MG/2ML IJ SOLN
INTRAMUSCULAR | Status: AC
  Filled 2016-07-01: qty 2

## 2016-07-01 MED ORDER — ACETAMINOPHEN 325 MG PO TABS: 650.0000 mg | ORAL_TABLET | Freq: Four times a day (QID) | ORAL | Status: DC | PRN

## 2016-07-01 MED ORDER — METOCLOPRAMIDE HCL 10 MG PO TABS: 5.0000 mg | ORAL_TABLET | Freq: Three times a day (TID) | ORAL | Status: DC | PRN

## 2016-07-01 MED ORDER — POTASSIUM CHLORIDE IN NACL 20-0.9 MEQ/L-% IV SOLN
INTRAVENOUS | Status: DC
  Administered 2016-07-01: 75 mL/h via INTRAVENOUS
  Administered 2016-07-03: 10:00:00 via INTRAVENOUS
  Filled 2016-07-01 (×5): qty 1000

## 2016-07-01 SURGICAL SUPPLY — 62 items
BLADE SAGITTAL WIDE XTHICK NO (BLADE) IMPLANT
CANISTER SUCT 1200ML W/VALVE (MISCELLANEOUS) ×10 IMPLANT
CANISTER SUCT 3000ML PPV (MISCELLANEOUS) ×10 IMPLANT
CATH TRAY METER 16FR LF (MISCELLANEOUS) ×5 IMPLANT
CHLORAPREP W/TINT 26ML (MISCELLANEOUS) ×10 IMPLANT
COOLER POLAR GLACIER W/PUMP (MISCELLANEOUS) ×5 IMPLANT
CRADLE LAMINECT ARM (MISCELLANEOUS) ×5 IMPLANT
DRAPE IMP U-DRAPE 54X76 (DRAPES) ×10 IMPLANT
DRAPE INCISE IOBAN 66X45 STRL (DRAPES) ×10 IMPLANT
DRAPE INCISE IOBAN 66X60 STRL (DRAPES) ×5 IMPLANT
DRAPE SHEET LG 3/4 BI-LAMINATE (DRAPES) ×10 IMPLANT
DRAPE TABLE BACK 80X90 (DRAPES) ×5 IMPLANT
DRSG OPSITE POSTOP 4X8 (GAUZE/BANDAGES/DRESSINGS) IMPLANT
ELECT CAUTERY BLADE 6.4 (BLADE) ×5 IMPLANT
GLOVE BIO SURGEON STRL SZ7.5 (GLOVE) ×20 IMPLANT
GLOVE BIO SURGEON STRL SZ8 (GLOVE) ×20 IMPLANT
GLOVE BIOGEL PI IND STRL 8 (GLOVE) ×15 IMPLANT
GLOVE BIOGEL PI INDICATOR 8 (GLOVE) ×10
GLOVE INDICATOR 8.0 STRL GRN (GLOVE) ×5 IMPLANT
GOWN STRL REUS W/ TWL LRG LVL3 (GOWN DISPOSABLE) ×3 IMPLANT
GOWN STRL REUS W/ TWL XL LVL3 (GOWN DISPOSABLE) ×3 IMPLANT
GOWN STRL REUS W/TWL LRG LVL3 (GOWN DISPOSABLE) ×2
GOWN STRL REUS W/TWL XL LVL3 (GOWN DISPOSABLE) ×2
HANDPIECE INTERPULSE COAX TIP (DISPOSABLE) ×2
HOOD PEEL AWAY FLYTE STAYCOOL (MISCELLANEOUS) ×15 IMPLANT
KIT PREVENA INCISION MGT 13 (CANNISTER) ×5 IMPLANT
KIT RM TURNOVER STRD PROC AR (KITS) ×5 IMPLANT
KIT STABILIZATION SHOULDER (MISCELLANEOUS) ×5 IMPLANT
KIT STIMULAN RAPID CURE 5CC (Orthopedic Implant) ×5 IMPLANT
MASK FACE SPIDER DISP (MASK) ×5 IMPLANT
NDL MAYO CATGUT SZ1 (NEEDLE)
NDL MAYO CATGUT SZ5 (NEEDLE)
NDL SAFETY 22GX1.5 (NEEDLE) ×5 IMPLANT
NDL SUT 5 .5 CRC TPR PNT MAYO (NEEDLE) IMPLANT
NEEDLE 18GX1X1/2 (RX/OR ONLY) (NEEDLE) ×5 IMPLANT
NEEDLE HYPO 25X1 1.5 SAFETY (NEEDLE) ×5 IMPLANT
NEEDLE MAYO CATGUT SZ1 (NEEDLE) IMPLANT
NEEDLE MAYO CATGUT SZ4 (NEEDLE) IMPLANT
NEEDLE SPNL 20GX3.5 QUINCKE YW (NEEDLE) ×5 IMPLANT
NS IRRIG 1000ML POUR BTL (IV SOLUTION) ×5 IMPLANT
PACK ARTHROSCOPY SHOULDER (MISCELLANEOUS) ×5 IMPLANT
PAD WRAPON POLAR SHDR UNIV (MISCELLANEOUS) ×3 IMPLANT
SET HNDPC FAN SPRY TIP SCT (DISPOSABLE) ×3 IMPLANT
SLING ARM S TX990203 (SOFTGOODS) ×5 IMPLANT
SLING ULTRA II M (MISCELLANEOUS) ×5 IMPLANT
SOL .9 NS 3000ML IRR  AL (IV SOLUTION) ×2
SOL .9 NS 3000ML IRR UROMATIC (IV SOLUTION) ×3 IMPLANT
SPONGE LAP 18X18 5 PK (GAUZE/BANDAGES/DRESSINGS) ×10 IMPLANT
STAPLER SKIN PROX 35W (STAPLE) IMPLANT
SUT ETHIBOND 0 MO6 C/R (SUTURE) IMPLANT
SUT ETHIBOND NAB CT1 #1 30IN (SUTURE) IMPLANT
SUT FIBERWIRE #2 38 BLUE 1/2 (SUTURE)
SUT PROLENE 1 CT 1 30 (SUTURE) ×10 IMPLANT
SUT VIC AB 0 CT1 36 (SUTURE) IMPLANT
SUT VIC AB 2-0 CT1 27 (SUTURE)
SUT VIC AB 2-0 CT1 TAPERPNT 27 (SUTURE) IMPLANT
SUTURE FIBERWR #2 38 BLUE 1/2 (SUTURE) IMPLANT
SYR 30ML LL (SYRINGE) ×10 IMPLANT
SYRINGE 10CC LL (SYRINGE) ×5 IMPLANT
TUBE CONNECTING  6'X3/16 (MISCELLANEOUS) ×1
TUBE CONNECTING 6X3/16 (MISCELLANEOUS) ×4 IMPLANT
WRAPON POLAR PAD SHDR UNIV (MISCELLANEOUS) ×5

## 2016-07-01 NOTE — Anesthesia Postprocedure Evaluation (Signed)
Anesthesia Post Note  Patient: Cheryl Mueller  Procedure(s) Performed: Procedure(s) (LRB): IRRIGATION AND DEBRIDEMENT SHOULDER (Left) APPLICATION OF WOUND VAC ARTHROCENTESIS ASPIRATION LARGE JOINT (N/A)  Patient location during evaluation: PACU Anesthesia Type: General Level of consciousness: awake and alert Pain management: pain level controlled Vital Signs Assessment: post-procedure vital signs reviewed and stable Respiratory status: spontaneous breathing and respiratory function stable Cardiovascular status: stable Anesthetic complications: no     Last Vitals:  Vitals:   07/01/16 1830 07/01/16 1940  BP: (!) 142/72 (!) 135/94  Pulse: 98 (!) 102  Resp: 16   Temp: 36.4 C 36.9 C    Last Pain:  Vitals:   07/01/16 1940  TempSrc: Oral  PainSc:                  KEPHART,WILLIAM K

## 2016-07-01 NOTE — Progress Notes (Addendum)
Orders received from MD Poggi to keep pt NPO, MD taking pt to OR. MD notified that pts potasium is 2.7. Orders received for NS w 40 K 100 ml/hr, Klor Con 40 mg PO now Klor Con 40 mg one hr later. Orders placed.

## 2016-07-01 NOTE — Progress Notes (Signed)
CH rounding the unit visit the Pt. Pt appeared was in discomfort, complained about lack breath, and requested for a Nurse. CH reported the Pt's concerns to the Nurse who responded immediately to the Pt's request.    07/01/16 1420  Clinical Encounter Type  Visited With Patient  Visit Type Initial;Spiritual support  Referral From Chaplain  Spiritual Encounters  Spiritual Needs Prayer;Emotional  Advance Directives (For Healthcare)  Does Patient Have a Medical Advance Directive? Yes

## 2016-07-01 NOTE — Transfer of Care (Signed)
Immediate Anesthesia Transfer of Care Note  Patient: Cheryl Mueller  Procedure(s) Performed: Procedure(s) with comments: IRRIGATION AND DEBRIDEMENT SHOULDER (Left) APPLICATION OF WOUND VAC ARTHROCENTESIS ASPIRATION LARGE JOINT (N/A) - Aspiration to see if joint infected, no fluid obtained  Patient Location: PACU  Anesthesia Type:General  Level of Consciousness: sedated  Airway & Oxygen Therapy: Patient Spontanous Breathing and Patient connected to face mask oxygen  Post-op Assessment: Report given to RN and Post -op Vital signs reviewed and stable  Post vital signs: Reviewed and stable  Last Vitals:  Vitals:   07/01/16 1424 07/01/16 1646  BP: (!) 163/70   Pulse: 87 (!) 103  Resp: 16 18  Temp: 37.2 C 37.2 C    Complications: No apparent anesthesia complications

## 2016-07-01 NOTE — Progress Notes (Signed)
Per RN in progression rounds patient is having surgery today. [facility] liaison is aware of above. Clinical Social Worker (CSW) will continue to follow and assist as needed.   Baker Hughes IncorporatedBailey Nahsir Venezia, LCSW [phone]

## 2016-07-01 NOTE — Anesthesia Procedure Notes (Signed)
Procedure Name: Intubation Date/Time: 07/01/2016 3:02 PM Performed by: Doreen Salvage Pre-anesthesia Checklist: Patient identified, Patient being monitored, Timeout performed, Emergency Drugs available and Suction available Patient Re-evaluated:Patient Re-evaluated prior to inductionOxygen Delivery Method: Circle system utilized Preoxygenation: Pre-oxygenation with 100% oxygen Intubation Type: IV induction Ventilation: Mask ventilation without difficulty Laryngoscope Size: Mac and 3 Grade View: Grade III Tube type: Oral Tube size: 7.0 mm Number of attempts: 1 Airway Equipment and Method: Stylet and Bougie stylet Placement Confirmation: ETT inserted through vocal cords under direct vision,  positive ETCO2 and breath sounds checked- equal and bilateral Secured at: 21 cm Tube secured with: Tape Dental Injury: Teeth and Oropharynx as per pre-operative assessment  Difficulty Due To: Difficulty was unanticipated

## 2016-07-01 NOTE — Progress Notes (Signed)
Subjective: Patient notes drainage began to develop from posterior shoulder last evening. Continues to note mild/moderate pain in the shoulder region.   Objective: Vital signs in last 24 hours: Temp:  [97.6 F (36.4 C)-98.9 F (37.2 C)] 98.9 F (37.2 C) (01/08 0742) Pulse Rate:  [78-87] 87 (01/08 0742) Resp:  [18-20] 18 (01/08 0742) BP: (123-152)/(52-64) 152/64 (01/08 0742) SpO2:  [94 %-96 %] 94 % (01/08 0742)  Intake/Output from previous day: 01/07 0701 - 01/08 0700 In: 1160 [P.O.:960; IV Piggyback:200] Out: -  Intake/Output this shift: No intake/output data recorded.   Recent Labs  06/29/16 0308 07/01/16 0351  HGB 8.7* 9.1*    Recent Labs  06/29/16 0308 07/01/16 0351  WBC 14.0* 10.3  RBC 3.04* 3.13*  HCT 25.9* 27.0*  PLT 425 384    Recent Labs  06/30/16 1352 07/01/16 0351  NA  --  138  K  --  2.7*  CL  --  104  CO2  --  28  BUN  --  9  CREATININE 0.49 0.56  GLUCOSE  --  147*  CALCIUM  --  7.8*   No results for input(s): LABPT, INR in the last 72 hours.  Physical Exam: There is an area of purulent drainage from the posterior lateral aspect of the shoulder emanating from the area of scabbing noted previously. Although the area of erythema appears to be improving, the overall swelling in the upper arm appears to be worsening. She remains neurovascularly intact to the left upper extremity and hand.  Assessment: Cellulitis left upper extremity status post reverse left total shoulder arthroplasty.  Plan: Given the patient's change and examination findings, I am concerned that there may actually be infection in the joint itself. Therefore, I am ordering a CT scan of the left upper extremity and shoulder area was morning to see if there is any effusion or fluid collection. In addition, the patient is made nothing by mouth at this time. Please continue IV antibiotics as ordered.    Excell SeltzerJohn J Cayce Quezada 07/01/2016, 8:04 AM

## 2016-07-01 NOTE — Op Note (Signed)
06/26/2016 - 07/01/2016  4:37 PM  Patient:   Cheryl Mueller  Pre-Op Diagnosis:   Cellulitis of posterolateral aspect left shoulder with possible deep space infection status post prior reverse total shoulder arthroplasty.  Post-Op Diagnosis:   Subcutaneous abscess of the posterolateral left shoulder with no evidence for joint involvement status post reverse left total shoulder arthroplasty.  Procedure:   Irrigation and debridement of posterolateral left shoulder abscess with application of wound VAC. Aspiration of left glenohumeral joint.  Surgeon:   Maryagnes AmosJ. Jeffrey Tejal Monroy, MD  Assistant:   Horris LatinoLance McGhee, PA-C  Anesthesia:   GET  Findings:   As above. There was a significant abscess with subcutaneous extension and multiple directions involving the posterior lateral aspect of the shoulder that did not appear to go deep to the deltoid muscle. There was no evidence for connection to the glenohumeral joint or to the prosthesis. The left glenohumeral joint was aspirated, but no joint fluid could be obtained despite feeling the needle contact the metallic glenosphere.  Complications:   None  EBL:  100 cc  Fluids:   800 cc crystalloid  UOP:  200 cc  TT:   None  Drains:   None  Closure:   Staples  Implants:   None  Brief Clinical Note:   The patient is a 76 year old female who is now 2.5 months status post a reverse left total shoulder arthroplasty for a displaced 4 part proximal humerus fracture. The patient presented to the emergency room 5 days ago with pain, swelling, and erythema over the posterolateral and lateral aspects of the shoulder and upper arm. The patient was admitted for IV antibiotics. Orthopedics was consulted 3 days ago. At the time, an attempted aspiration of the glenohumeral joint showed no evidence of fluid in the joint. In addition, her examination demonstrated no evidence for erythema or swelling along the anterior shoulder and incision site, and there was no suggestion of  effusion. Therefore, it was elected to continue to treat her for a localized cellulitis involving the posterolateral aspect of the shoulder from a scab identified over the posterior lateral shoulder region. The patient began to demonstrate improvement over the next couple of days. Her white cell count improve dramatically and she remained afebrile. This morning, she was noted to have a large amount of purulent drainage from the posterolateral shoulder region emanating from the scab area, raising concern that a deeper infection was involved. A CT scan of the shoulder was obtained but was inconclusive for deep space involvement. Therefore, the patient is brought to the operating room at this time for I&D of the posterolateral shoulder region with possible I&D of the glenohumeral joint with component exchange.   Procedure:   The patient was brought into the operating room and lain in the supine position. After adequate general endotracheal intubation and anesthesia was obtained, a Foley catheter was inserted before the patient was repositioned in the beach chair position using the beach chair positioner. The left shoulder and upper extremity were prepped with ChloraPrep solution before being draped sterilely. Preoperative antibiotics were not administered as the patient was already on IV antibiotics. The area of cellulitis and induration in the posterolateral aspect of the shoulder was incised, ellipsing the sinus tract through an approximately 6 cm incision. This incision was carried down through the subcutaneous tissues to expose the abscess. Copious amounts of purulent material was expressed and some of this was cultured. Using digital dissection and dissection/debridement with Metzenbaum scissors, the extent of the abscess  was explored and opened. It tracked distally, anterolaterally, and posteriorly, but did not appear to penetrate the deltoid muscle, nor did it appear to track deep to the deltoid muscle into  the joint. After thorough debridement of this area, the wound was copiously irrigated with 3 L of antibiotic irrigation utilizing the pulse lavage system followed by 3 L of sterile saline, also using the pulse lavage system. The proximal and distal portions of the incision were loosely reapproximated using #1 Prolene interrupted sutures after packing the soft tissue pocket with dissolving antibiotic beads containing both gentamicin and vancomycin. A sterile Stimulan wound VAC was applied over the wound. Suction was applied to verify that it had a good seal.   After changing gloves and resterilizing the anterior aspect of the shoulder, another attempt at aspirating the joint was performed with a 20-gauge spinal needle. Again, no fluid could be obtained despite assuring that the needle was in the joint by feeling the glenosphere with the tip of the needle. Therefore, it was elected not to proceed with a formal exploration with possible irrigation and debridement of the glenohumeral joint for fear of exposing a presumeably sterile joint to the infection posterolaterally. The patient was placed into a sling before she was awakened, extubated, and returned to the recovery room in satisfactory condition after tolerating the procedure well.

## 2016-07-01 NOTE — Progress Notes (Signed)
Paged MD to inform of potassium 2.7

## 2016-07-01 NOTE — Care Management Important Message (Signed)
Important Message  Patient Details  Name: Cheryl Mueller MRN: [id number] Date of Birth: Aug 06, 1940   Medicare Important Message Given:  Yes    Marily MemosLisa M [name], RN 07/01/2016, 10:47 AM

## 2016-07-01 NOTE — Progress Notes (Signed)
MEDICATION RELATED CONSULT NOTE - INITIAL   Pharmacy Consult for Electrolyte Management Indication: Hypokalemia  Allergies  Allergen Reactions  . Alendronate Other (See Comments)    Reaction:  Leg pain   . Atorvastatin Rash  . Formaldehyde Rash    Patient Measurements: Height: 4\' 11"  (149.9 cm) Weight: 123 lb 6.4 oz (56 kg) IBW/kg (Calculated) : 43.2 Adjusted Body Weight:    Vital Signs: Temp: 98.9 F (37.2 C) (01/08 0742) Temp Source: Oral (01/08 0742) BP: 152/64 (01/08 0742) Pulse Rate: 87 (01/08 0742) Intake/Output from previous day: 01/07 0701 - 01/08 0700 In: 1160 [P.O.:960; IV Piggyback:200] Out: -  Intake/Output from this shift: No intake/output data recorded.  Labs:  Recent Labs  06/29/16 0308 06/30/16 1352 07/01/16 0351  WBC 14.0*  --  10.3  HGB 8.7*  --  9.1*  HCT 25.9*  --  27.0*  PLT 425  --  384  CREATININE  --  0.49 0.56   Estimated Creatinine Clearance: 44.9 mL/min (by C-G formula based on SCr of 0.56 mg/dL).   Microbiology: Recent Results (from the past 720 hour(s))  Blood culture (routine x 2)     Status: None (Preliminary result)   Collection Time: 06/26/16  3:48 PM  Result Value Ref Range Status   Specimen Description BLOOD RIGHT AC  Final   Special Requests   Final    BOTTLES DRAWN AEROBIC AND ANAEROBIC AER ANA   Culture  Setup Time   Final    GRAM NEGATIVE RODS AEROBIC BOTTLE ONLY CRITICAL RESULT CALLED TO, READ BACK BY AND VERIFIED WITH: MELLISSA MACCIA ON 06/30/16 AT 1103 [facility] Performed at [facility]    Culture GRAM NEGATIVE RODS  Final   Report Status PENDING  Incomplete  Blood Culture ID Panel (Reflexed)     Status: Abnormal   Collection Time: 06/26/16  3:48 PM  Result Value Ref Range Status   Enterococcus species NOT DETECTED NOT DETECTED Final   Listeria monocytogenes NOT DETECTED NOT DETECTED Final   Staphylococcus species NOT DETECTED NOT DETECTED Final   Staphylococcus aureus NOT DETECTED NOT DETECTED  Final   Streptococcus species NOT DETECTED NOT DETECTED Final   Streptococcus agalactiae NOT DETECTED NOT DETECTED Final   Streptococcus pneumoniae NOT DETECTED NOT DETECTED Final   Streptococcus pyogenes NOT DETECTED NOT DETECTED Final   Acinetobacter baumannii NOT DETECTED NOT DETECTED Final   Enterobacteriaceae species DETECTED (A) NOT DETECTED Final    Comment: CRITICAL RESULT CALLED TO, READ BACK BY AND VERIFIED WITH: MELLISSA MACCIA ON 06/30/16 AT 1103 [facility]    Enterobacter cloacae complex NOT DETECTED NOT DETECTED Final   Escherichia coli DETECTED (A) NOT DETECTED Final    Comment: CRITICAL RESULT CALLED TO, READ BACK BY AND VERIFIED WITH: MELLISSA MACCIA ON 06/30/16 AT 1103 [facility]    Klebsiella oxytoca NOT DETECTED NOT DETECTED Final   Klebsiella pneumoniae NOT DETECTED NOT DETECTED Final   Proteus species NOT DETECTED NOT DETECTED Final   Serratia marcescens NOT DETECTED NOT DETECTED Final   Carbapenem resistance NOT DETECTED NOT DETECTED Final   Haemophilus influenzae NOT DETECTED NOT DETECTED Final   Neisseria meningitidis NOT DETECTED NOT DETECTED Final   Pseudomonas aeruginosa NOT DETECTED NOT DETECTED Final   Candida albicans NOT DETECTED NOT DETECTED Final   Candida glabrata NOT DETECTED NOT DETECTED Final   Candida krusei NOT DETECTED NOT DETECTED Final   Candida parapsilosis NOT DETECTED NOT DETECTED Final   Candida tropicalis NOT DETECTED NOT DETECTED Final  Blood culture (routine x 2)     Status: None   Collection Time: 06/26/16  4:27 PM  Result Value Ref Range Status   Specimen Description BLOOD LEFT FOREARM  Final   Special Requests   Final    BOTTLES DRAWN AEROBIC AND ANAEROBIC AER 11ML ANA 1ML   Culture NO GROWTH 5 DAYS  Final   Report Status 07/01/2016 FINAL  Final  Urine culture     Status: Abnormal   Collection Time: 06/26/16  6:01 PM  Result Value Ref Range Status   Specimen Description URINE, CATHETERIZED  Final   Special Requests NONE  Final   Culture  >=100,000 COLONIES/mL ESCHERICHIA COLI (A)  Final   Report Status 06/30/2016 FINAL  Final   Organism ID, Bacteria ESCHERICHIA COLI (A)  Final      Susceptibility   Escherichia coli - MIC*    AMPICILLIN <=2 SENSITIVE Sensitive     CEFAZOLIN <=4 SENSITIVE Sensitive     CEFTRIAXONE <=1 SENSITIVE Sensitive     CIPROFLOXACIN <=0.25 SENSITIVE Sensitive     GENTAMICIN <=1 SENSITIVE Sensitive     IMIPENEM <=0.25 SENSITIVE Sensitive     NITROFURANTOIN <=16 SENSITIVE Sensitive     TRIMETH/SULFA <=20 SENSITIVE Sensitive     AMPICILLIN/SULBACTAM <=2 SENSITIVE Sensitive     PIP/TAZO <=4 SENSITIVE Sensitive     Extended ESBL NEGATIVE Sensitive     * >=100,000 COLONIES/mL ESCHERICHIA COLI  MRSA PCR Screening     Status: None   Collection Time: 06/26/16  9:39 PM  Result Value Ref Range Status   MRSA by PCR NEGATIVE NEGATIVE Final    Comment:        The GeneXpert MRSA Assay (FDA approved for NASAL specimens only), is one component of a comprehensive MRSA colonization surveillance program. It is not intended to diagnose MRSA infection nor to guide or monitor treatment for MRSA infections.   C difficile quick scan w PCR reflex     Status: Abnormal   Collection Time: 06/29/16 11:20 AM  Result Value Ref Range Status   C Diff antigen POSITIVE (A) NEGATIVE Final   C Diff toxin NEGATIVE NEGATIVE Final   C Diff interpretation Results are indeterminate. See PCR results.  Final  Clostridium Difficile by PCR     Status: None   Collection Time: 06/29/16 11:20 AM  Result Value Ref Range Status   Toxigenic C Difficile by pcr NEGATIVE NEGATIVE Final    Comment: Patient is colonized with non toxigenic C. difficile. May not need treatment unless significant symptoms are present.    Medical History: Past Medical History:  Diagnosis Date  . Anxiety   . Arthritis   . Cervical myelopathy (HCC)   . Diabetes mellitus without complication (HCC)    Non Insulin dependant  . Headache    h/o migraines  .  Hyperlipemia   . Hypertension   . Restless leg syndrome   . Stroke [facility](HCC) 2002   mini stroke  . TIA (transient ischemic attack)    approx 15 years ago  . Vertigo    hx of  . White matter disease     Assessment: Patient with hypokalemia (K 2.7) probably due to recent diarrhea. Pharmacy consulted to manage electrolytes.  Goal of Therapy:  Maintain electrolytes WNL  Plan:  Patient already has orders for scheduled KCl 10mEq PO daily (home dose) and KCl 30mEq IV x one and KCl 40mEq PO x 1. Will continue with these orders and recheck BMET and Magnesium at  16:00.   Clovia Cuff, PharmD, BCPS 07/01/2016 10:19 AM

## 2016-07-01 NOTE — Progress Notes (Addendum)
Patient ID: Cheryl Mueller, female   DOB: [birth year], 76 y.o.   MRN: [id number]  [employer] PROGRESS NOTE  Cheryl Mueller FAO:[id number] DOB: Dec 11, 1940 DOA: 06/26/2016 PCP: Curtis Sites III, MD  HPI/Subjective: Patient in pain at left shoulder. Arm in sling,  Objective: Vitals:   07/01/16 1752 07/01/16 1830  BP: 140/70 (!) 142/72  Pulse: 100 98  Resp: 16 16  Temp: 97.2 F (36.2 C) 97.5 F (36.4 C)    Filed Weights   06/26/16 1436 06/27/16 0504  Weight: 61.7 kg (136 lb) 56 kg (123 lb 6.4 oz)    ROS: Review of Systems  Constitutional: Negative for chills and fever.  Eyes: Negative for blurred vision.  Respiratory: Negative for cough and shortness of breath.   Cardiovascular: Negative for chest pain.  Gastrointestinal: Positive for diarrhea. Negative for abdominal pain, constipation, nausea and vomiting.  Genitourinary: Negative for dysuria.  Musculoskeletal: Positive for joint pain.  Neurological: Negative for dizziness and headaches.   Exam: Physical Exam  Constitutional: She is oriented to person, place, and time.  HENT:  Nose: No mucosal edema.  Mouth/Throat: No oropharyngeal exudate or posterior oropharyngeal edema.  Eyes: Conjunctivae, EOM and lids are normal. Pupils are equal, round, and reactive to light.  Neck: No JVD present. Carotid bruit is not present. No edema present. No thyroid mass and no thyromegaly present.  Cardiovascular: S1 normal and S2 normal.  Exam reveals no gallop.   No murmur heard. Pulses:      Dorsalis pedis pulses are 2+ on the right side, and 2+ on the left side.  Respiratory: No respiratory distress. She has decreased breath sounds in the right lower field and the left lower field. She has no wheezes. She has no rhonchi. She has no rales.  GI: Soft. Bowel sounds are normal. There is no tenderness.  Musculoskeletal:       Left shoulder: She exhibits decreased range of motion, tenderness and swelling.       Right ankle: She exhibits  no swelling.       Left ankle: She exhibits no swelling.  Lymphadenopathy:    She has no cervical adenopathy.  Neurological: She is alert and oriented to person, place, and time. No cranial nerve deficit.  Skin: Skin is warm. Nails show no clubbing.  Lt should in sling, tender to touch Rash on her back has healing scabs. It crosses the midline.  Psychiatric: She has a normal mood and affect.      Data Reviewed: Basic Metabolic Panel:  Recent Labs Lab 06/26/16 1439 06/27/16 0440 06/28/16 0420 06/30/16 1352 07/01/16 0351 07/01/16 1306 07/01/16 1837  NA 131* 134* 136  --  138  --   --   K 3.3* 3.2* 3.5  --  2.7* 2.9* 3.5  CL 93* 106 106  --  104  --   --   CO2 23 23 24   --  28  --   --   GLUCOSE 242* 42* 124*  --  147*  --   --   BUN 29* 23* 24*  --  9  --   --   CREATININE 1.20* 0.73 0.86 0.49 0.56  --   --   CALCIUM 9.9 8.2* 7.8*  --  7.8*  --   --   MG  --   --   --   --   --   --  1.2*   Liver Function Tests:  Recent Labs Lab 06/26/16 1439  AST  56*  ALT 26  ALKPHOS 132*  BILITOT <0.1*  PROT 6.8  ALBUMIN 2.9*   CBC:  Recent Labs Lab 06/26/16 1439 06/27/16 0440 06/28/16 0420 06/29/16 0308 07/01/16 0351  WBC 28.2* 21.0* 17.3* 14.0* 10.3  NEUTROABS 26.7*  --   --   --   --   HGB 10.2* 8.7* 8.3* 8.7* 9.1*  HCT 30.5* 25.8* 24.9* 25.9* 27.0*  MCV 86.5 85.9 86.7 85.4 86.3  PLT 575* 430 413 425 384    CBG:  Recent Labs Lab 06/30/16 2101 07/01/16 0818 07/01/16 1137 07/01/16 1417 07/01/16 1652  GLUCAP 218* 133* 150* 121* 151*    Recent Results (from the past 240 hour(s))  Blood culture (routine x 2)     Status: Abnormal (Preliminary result)   Collection Time: 06/26/16  3:48 PM  Result Value Ref Range Status   Specimen Description BLOOD RIGHT AC  Final   Special Requests   Final    BOTTLES DRAWN AEROBIC AND ANAEROBIC AER 13ML ANA 10ML   Culture  Setup Time   Final    GRAM NEGATIVE RODS AEROBIC BOTTLE ONLY CRITICAL RESULT CALLED TO, READ BACK  BY AND VERIFIED WITH: MELLISSA MACCIA ON 06/30/16 AT 1103 [facility]    Culture (A)  Final    ESCHERICHIA COLI SUSCEPTIBILITIES TO FOLLOW [facility]    Report Status PENDING  Incomplete  Blood Culture ID Panel (Reflexed)     Status: Abnormal   Collection Time: 06/26/16  3:48 PM  Result Value Ref Range Status   Enterococcus species NOT DETECTED NOT DETECTED Final   Listeria monocytogenes NOT DETECTED NOT DETECTED Final   Staphylococcus species NOT DETECTED NOT DETECTED Final   Staphylococcus aureus NOT DETECTED NOT DETECTED Final   Streptococcus species NOT DETECTED NOT DETECTED Final   Streptococcus agalactiae NOT DETECTED NOT DETECTED Final   Streptococcus pneumoniae NOT DETECTED NOT DETECTED Final   Streptococcus pyogenes NOT DETECTED NOT DETECTED Final   Acinetobacter baumannii NOT DETECTED NOT DETECTED Final   Enterobacteriaceae species DETECTED (A) NOT DETECTED Final    Comment: CRITICAL RESULT CALLED TO, READ BACK BY AND VERIFIED WITH: MELLISSA MACCIA ON 06/30/16 AT 1103 [facility]    Enterobacter cloacae complex NOT DETECTED NOT DETECTED Final   Escherichia coli DETECTED (A) NOT DETECTED Final    Comment: CRITICAL RESULT CALLED TO, READ BACK BY AND VERIFIED WITH: MELLISSA MACCIA ON 06/30/16 AT 1103 [facility]    Klebsiella oxytoca NOT DETECTED NOT DETECTED Final   Klebsiella pneumoniae NOT DETECTED NOT DETECTED Final   Proteus species NOT DETECTED NOT DETECTED Final   Serratia marcescens NOT DETECTED NOT DETECTED Final   Carbapenem resistance NOT DETECTED NOT DETECTED Final   Haemophilus influenzae NOT DETECTED NOT DETECTED Final   Neisseria meningitidis NOT DETECTED NOT DETECTED Final   Pseudomonas aeruginosa NOT DETECTED NOT DETECTED Final   Candida albicans NOT DETECTED NOT DETECTED Final   Candida glabrata NOT DETECTED NOT DETECTED Final   Candida krusei NOT DETECTED NOT DETECTED Final   Candida parapsilosis NOT DETECTED NOT DETECTED Final   Candida tropicalis NOT  DETECTED NOT DETECTED Final  Blood culture (routine x 2)     Status: None   Collection Time: 06/26/16  4:27 PM  Result Value Ref Range Status   Specimen Description BLOOD LEFT FOREARM  Final   Special Requests   Final    BOTTLES DRAWN AEROBIC AND ANAEROBIC AER 11ML ANA 1ML   Culture NO GROWTH 5 DAYS  Final  Report Status 07/01/2016 FINAL  Final  Urine culture     Status: Abnormal   Collection Time: 06/26/16  6:01 PM  Result Value Ref Range Status   Specimen Description URINE, CATHETERIZED  Final   Special Requests NONE  Final   Culture >=100,000 COLONIES/mL ESCHERICHIA COLI (A)  Final   Report Status 06/30/2016 FINAL  Final   Organism ID, Bacteria ESCHERICHIA COLI (A)  Final      Susceptibility   Escherichia coli - MIC*    AMPICILLIN <=2 SENSITIVE Sensitive     CEFAZOLIN <=4 SENSITIVE Sensitive     CEFTRIAXONE <=1 SENSITIVE Sensitive     CIPROFLOXACIN <=0.25 SENSITIVE Sensitive     GENTAMICIN <=1 SENSITIVE Sensitive     IMIPENEM <=0.25 SENSITIVE Sensitive     NITROFURANTOIN <=16 SENSITIVE Sensitive     TRIMETH/SULFA <=20 SENSITIVE Sensitive     AMPICILLIN/SULBACTAM <=2 SENSITIVE Sensitive     PIP/TAZO <=4 SENSITIVE Sensitive     Extended ESBL NEGATIVE Sensitive     * >=100,000 COLONIES/mL ESCHERICHIA COLI  MRSA PCR Screening     Status: None   Collection Time: 06/26/16  9:39 PM  Result Value Ref Range Status   MRSA by PCR NEGATIVE NEGATIVE Final    Comment:        The GeneXpert MRSA Assay (FDA approved for NASAL specimens only), is one component of a comprehensive MRSA colonization surveillance program. It is not intended to diagnose MRSA infection nor to guide or monitor treatment for MRSA infections.   C difficile quick scan w PCR reflex     Status: Abnormal   Collection Time: 06/29/16 11:20 AM  Result Value Ref Range Status   C Diff antigen POSITIVE (A) NEGATIVE Final   C Diff toxin NEGATIVE NEGATIVE Final   C Diff interpretation Results are indeterminate. See  PCR results.  Final  Clostridium Difficile by PCR     Status: None   Collection Time: 06/29/16 11:20 AM  Result Value Ref Range Status   Toxigenic C Difficile by pcr NEGATIVE NEGATIVE Final    Comment: Patient is colonized with non toxigenic C. difficile. May not need treatment unless significant symptoms are present.  Aerobic/Anaerobic Culture (surgical/deep wound)     Status: None (Preliminary result)   Collection Time: 07/01/16  1:00 PM  Result Value Ref Range Status   Specimen Description   Final    ABSCESS LEFT SHOULDER Performed at [facility]    Special Requests NONE  Final   Gram Stain PENDING  Incomplete   Culture PENDING  Incomplete   Report Status PENDING  Incomplete     Studies: Ct Shoulder Left W Contrast  Result Date: 07/01/2016 CLINICAL DATA:  Left upper extremity cellulitis. Status post reversed left total shoulder arthroplasty. EXAM: CT OF THE UPPER LEFT EXTREMITY WITH CONTRAST TECHNIQUE: Multidetector CT imaging of the upper left extremity was performed according to the standard protocol following intravenous contrast administration. COMPARISON:  None. CONTRAST:  75mL ISOVUE-300 IOPAMIDOL (ISOVUE-300) INJECTION 61% FINDINGS: Bones/Joint/Cartilage Left reverse total shoulder arthroplasty with beam hardening artifact partially obscuring the adjacent soft tissue and osseous structures. Normal alignment. No failure or complication. No acute fracture or dislocation. Mild arthropathy of the acromioclavicular joint.  Type II acromion. Ligaments Suboptimally assessed by CT. Muscles and Tendons Muscles are normal.  No intramuscular fluid collection or hematoma. Soft tissues Severe soft tissue edema in the subcutaneous fat along the posterior aspect of the shoulder and extending into the left upper arm concerning for  cellulitis. No focal drainable fluid collection. IMPRESSION: 1. Severe soft tissue edema in the subcutaneous fat along the posterior aspect of the shoulder and  extending into the left upper arm concerning for cellulitis. No focal fluid collection to suggest an abscess. 2. 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 Electronically Signed   By: Elige Ko   On: 07/01/2016 09:34    Scheduled Meds: . acetaminophen  1,000 mg Oral Q6H  . amLODipine  10 mg Oral Daily  . atorvastatin  80 mg Oral QHS  . docusate sodium  100 mg Oral BID  . [START ON 07/02/2016] enoxaparin (LOVENOX) injection  40 mg Subcutaneous Q24H  . ferrous sulfate  325 mg Oral TID PC  . glimepiride  1 mg Oral Q breakfast  . insulin aspart  0-9 Units Subcutaneous TID WC & HS  . lidocaine  1 patch Transdermal Q12H  . magnesium sulfate 1 - 4 g bolus IVPB  4 g Intravenous Once  . piperacillin-tazobactam (ZOSYN)  IV  3.375 g Intravenous Q8H  . potassium chloride  10 mEq Oral Daily  . rOPINIRole  0.25 mg Oral QHS  . traZODone  25 mg Oral QHS  . vancomycin  500 mg Intravenous Q24H  . venlafaxine XR  75 mg Oral Daily    Assessment/Plan:  1. Gram negative rod bacteremia and Left shoulder abscess: s/p Irrigation and debridement of posterolateral left shoulder abscess with application of wound VAC. Aspiration of left glenohumeral joint. Earlier today. Await ID f/up to narrow Abx 2. Hypokalemia/Hypomagnesemia: replete and recheck, 3. Acute cystitis with hematuria. Escherichia coli growing in urine. 4. Hypoglycemia: resolved now. Continue SSI and amaryl 5. Weakness. Physical therapy recommends rehabilitation 6. Essential hypertension on Norvasc 7. Hyperlipidemia unspecified on atorvastatin 8. Depression on Effexor  Code Status:     Code Status Orders        Start     Ordered   06/26/16 2031  Full code  Continuous     06/26/16 2031    Code Status History    Date Active Date Inactive Code Status Order ID Comments User Context   04/16/2016 12:08 PM 04/17/2016  6:41 PM Full Code [id number]   Christena Flake, MD Inpatient   10/13/2015  5:09 PM 10/18/2015  2:35 PM Full Code [id number]  Enid Baas, MD Inpatient   04/17/2015  4:30 PM 04/21/2015  2:23 PM Full Code [id number]  Lynnea Ferrier, MD Inpatient   03/23/2015  2:27 PM 03/23/2015  5:50 PM Full Code [id number]  Kennedy Bucker, MD Inpatient   11/26/2012  4:41 PM 11/27/2012  1:02 PM Full Code [id number]  Hewitt Shorts, MD Inpatient     Family Communication: Son at bedside Disposition Plan: Will need rehabilitation once infection improved  Consultants:  Infectious disease  Orthopedic surgery  Antibiotics:  Vancomycin   Zosyn   Time spent: 25 minutes  Yoshiharu Brassell [employer]

## 2016-07-01 NOTE — Progress Notes (Signed)
[place] INFECTIOUS DISEASE PROGRESS NOTE Date of Admission:  06/26/2016     ID: Cheryl Mueller is a 76 y.o. female with  Shoulder cellulitis, UTI, E coli bacteremia Active Problems:   Cellulitis   Subjective: S/p washout of abscess 1/8- no evidence joint involvment. No fevers, wbc down. Less pain and feels better  ROS  Eleven systems are reviewed and negative except per hpi  Medications:  Antibiotics Given (last 72 hours)    Date/Time Action Medication Dose Rate   06/28/16 1804 Given   [MAR Hold] piperacillin-tazobactam (ZOSYN) IVPB 3.375 g (MAR Hold since 07/01/16 1404) 3.375 g 12.5 mL/hr   06/28/16 1855 Given   [MAR Hold] vancomycin (VANCOCIN) 500 mg in sodium chloride 0.9 % 100 mL IVPB (MAR Hold since 07/01/16 1404) 500 mg 100 mL/hr   06/29/16 0008 Given   [MAR Hold] piperacillin-tazobactam (ZOSYN) IVPB 3.375 g (MAR Hold since 07/01/16 1404) 3.375 g 12.5 mL/hr   06/29/16 0902 Given   [MAR Hold] piperacillin-tazobactam (ZOSYN) IVPB 3.375 g (MAR Hold since 07/01/16 1404) 3.375 g 12.5 mL/hr   06/29/16 1735 Given   [MAR Hold] piperacillin-tazobactam (ZOSYN) IVPB 3.375 g (MAR Hold since 07/01/16 1404) 3.375 g 12.5 mL/hr   06/29/16 1918 Given   [MAR Hold] vancomycin (VANCOCIN) 500 mg in sodium chloride 0.9 % 100 mL IVPB (MAR Hold since 07/01/16 1404) 500 mg 100 mL/hr   06/29/16 2346 Given   [MAR Hold] piperacillin-tazobactam (ZOSYN) IVPB 3.375 g (MAR Hold since 07/01/16 1404) 3.375 g 12.5 mL/hr   06/30/16 0939 Given   [MAR Hold] piperacillin-tazobactam (ZOSYN) IVPB 3.375 g (MAR Hold since 07/01/16 1404) 3.375 g 12.5 mL/hr   06/30/16 1726 Given   [MAR Hold] piperacillin-tazobactam (ZOSYN) IVPB 3.375 g (MAR Hold since 07/01/16 1404) 3.375 g 12.5 mL/hr   06/30/16 1833 Given  [Per pharmacist, vancocin is compatible with IV zosyn]   [MAR Hold] vancomycin (VANCOCIN) 500 mg in sodium chloride 0.9 % 100 mL IVPB (MAR Hold since 07/01/16 1404) 500 mg 100 mL/hr   06/30/16 2319 Given    [MAR Hold] piperacillin-tazobactam (ZOSYN) IVPB 3.375 g (MAR Hold since 07/01/16 1404) 3.375 g 12.5 mL/hr   07/01/16 0829 Given   [MAR Hold] piperacillin-tazobactam (ZOSYN) IVPB 3.375 g (MAR Hold since 07/01/16 1404) 3.375 g 12.5 mL/hr   07/01/16 1546 Given  [Mixed in antibiotic beads]   gentamicin (GARAMYCIN) injection 80 mg    07/01/16 1546 Given  [Used in antibiotic beads]   vancomycin (VANCOCIN) powder 1,000 mg      . [MAR Hold] amLODipine  10 mg Oral Daily  . [MAR Hold] atorvastatin  80 mg Oral QHS  . [MAR Hold] enoxaparin (LOVENOX) injection  40 mg Subcutaneous Q24H  . [MAR Hold] glimepiride  1 mg Oral Q breakfast  . [MAR Hold] insulin aspart  0-9 Units Subcutaneous TID WC & HS  . [MAR Hold] lidocaine  1 patch Transdermal Q12H  . [MAR Hold] piperacillin-tazobactam (ZOSYN)  IV  3.375 g Intravenous Q8H  . [MAR Hold] potassium chloride  10 mEq Oral Daily  . [MAR Hold] potassium chloride (KCL MULTIRUN) 30 mEq in 265 mL IVPB  30 mEq Intravenous Once  . [MAR Hold] potassium chloride  40 mEq Oral Once  . [MAR Hold] rOPINIRole  0.25 mg Oral QHS  . [MAR Hold] traZODone  25 mg Oral QHS  . [MAR Hold] vancomycin  500 mg Intravenous Q24H  . [MAR Hold] venlafaxine XR  75 mg Oral Daily    Objective: Vital signs  in last 24 hours: Temp:  [98 F (36.7 C)-99 F (37.2 C)] 99 F (37.2 C) (01/08 1646) Pulse Rate:  [78-103] 103 (01/08 1646) Resp:  [16-20] 18 (01/08 1646) BP: (133-163)/(52-70) 163/70 (01/08 1424) SpO2:  [94 %-100 %] 100 % (01/08 1646) Constitutional:  oriented to person, place, and time. Frail, kyphtoic, diff sitting up in bed HENT: [place]/AT, PERRLA, no scleral icterus Mouth/Throat: Oropharynx is clear and dry . No oropharyngeal exudate.  Cardiovascular: Normal rate, regular rhythm and normal heart sounds. Exam reveals no gallop and no friction rub.  No murmur heard.  Pulmonary/Chest: Effort normal and breath sounds normal. NNeck = supple, no nuchal rigidity Abdominal: Soft.  Bowel sounds are normal.  exhibits no distension. There is no tenderness.  Lymphadenopathy: no cervical adenopathy. No axillary adenopathy Neurological: alert and oriented to person, place, and time.  Skin: L upper arm with less redness and  induration, post op incision covered  Psychiatric: a normal mood and affect.  behavior is normal.  Lab Results  Recent Labs  06/29/16 0308 06/30/16 1352 07/01/16 0351 07/01/16 1306  WBC 14.0*  --  10.3  --   HGB 8.7*  --  9.1*  --   HCT 25.9*  --  27.0*  --   NA  --   --  138  --   K  --   --  2.7* 2.9*  CL  --   --  104  --   CO2  --   --  28  --   BUN  --   --  9  --   CREATININE  --  0.49 0.56  --     Microbiology: Results for orders placed or performed during the hospital encounter of 06/26/16  Blood culture (routine x 2)     Status: Abnormal   Collection Time: 06/26/16  3:48 PM  Result Value Ref Range Status   Specimen Description BLOOD RIGHT AC  Final   Special Requests   Final    BOTTLES DRAWN AEROBIC AND ANAEROBIC AER 13ML ANA 10ML   Culture  Setup Time   Final    GRAM NEGATIVE RODS AEROBIC BOTTLE ONLY CRITICAL RESULT CALLED TO, READ BACK BY AND VERIFIED WITH: MELLISSA MACCIA ON 06/30/16 AT 1103 [facility] Performed at [place] (A)  Final   Report Status 07/02/2016 FINAL  Final   Organism ID, Bacteria ESCHERICHIA COLI  Final      Susceptibility   Escherichia coli - MIC*    AMPICILLIN 4 SENSITIVE Sensitive     CEFAZOLIN <=4 SENSITIVE Sensitive     CEFEPIME <=1 SENSITIVE Sensitive     CEFTAZIDIME <=1 SENSITIVE Sensitive     CEFTRIAXONE <=1 SENSITIVE Sensitive     CIPROFLOXACIN <=0.25 SENSITIVE Sensitive     GENTAMICIN <=1 SENSITIVE Sensitive     IMIPENEM <=0.25 SENSITIVE Sensitive     TRIMETH/SULFA <=20 SENSITIVE Sensitive     AMPICILLIN/SULBACTAM <=2 SENSITIVE Sensitive     PIP/TAZO <=4 SENSITIVE Sensitive     Extended ESBL NEGATIVE Sensitive     * ESCHERICHIA COLI  Blood Culture ID  Panel (Reflexed)     Status: Abnormal   Collection Time: 06/26/16  3:48 PM  Result Value Ref Range Status   Enterococcus species NOT DETECTED NOT DETECTED Final   Listeria monocytogenes NOT DETECTED NOT DETECTED Final   Staphylococcus species NOT DETECTED NOT DETECTED Final   Staphylococcus aureus NOT DETECTED NOT DETECTED Final   Streptococcus  species NOT DETECTED NOT DETECTED Final   Streptococcus agalactiae NOT DETECTED NOT DETECTED Final   Streptococcus pneumoniae NOT DETECTED NOT DETECTED Final   Streptococcus pyogenes NOT DETECTED NOT DETECTED Final   Acinetobacter baumannii NOT DETECTED NOT DETECTED Final   Enterobacteriaceae species DETECTED (A) NOT DETECTED Final    Comment: CRITICAL RESULT CALLED TO, READ BACK BY AND VERIFIED WITH: MELLISSA MACCIA ON 06/30/16 AT 1103 [place]    Enterobacter cloacae complex NOT DETECTED NOT DETECTED Final   Escherichia coli DETECTED (A) NOT DETECTED Final    Comment: CRITICAL RESULT CALLED TO, READ BACK BY AND VERIFIED WITH: MELLISSA MACCIA ON 06/30/16 AT 1103 [place]    Klebsiella oxytoca NOT DETECTED NOT DETECTED Final   Klebsiella pneumoniae NOT DETECTED NOT DETECTED Final   Proteus species NOT DETECTED NOT DETECTED Final   Serratia marcescens NOT DETECTED NOT DETECTED Final   Carbapenem resistance NOT DETECTED NOT DETECTED Final   Haemophilus influenzae NOT DETECTED NOT DETECTED Final   Neisseria meningitidis NOT DETECTED NOT DETECTED Final   Pseudomonas aeruginosa NOT DETECTED NOT DETECTED Final   Candida albicans NOT DETECTED NOT DETECTED Final   Candida glabrata NOT DETECTED NOT DETECTED Final   Candida krusei NOT DETECTED NOT DETECTED Final   Candida parapsilosis NOT DETECTED NOT DETECTED Final   Candida tropicalis NOT DETECTED NOT DETECTED Final  Blood culture (routine x 2)     Status: None   Collection Time: 06/26/16  4:27 PM  Result Value Ref Range Status   Specimen Description BLOOD LEFT FOREARM  Final   Special Requests   Final     BOTTLES DRAWN AEROBIC AND ANAEROBIC AER 11ML ANA 1ML   Culture NO GROWTH 5 DAYS  Final   Report Status 07/01/2016 FINAL  Final  Urine culture     Status: Abnormal   Collection Time: 06/26/16  6:01 PM  Result Value Ref Range Status   Specimen Description URINE, CATHETERIZED  Final   Special Requests NONE  Final   Culture >=100,000 COLONIES/mL ESCHERICHIA COLI (A)  Final   Report Status 06/30/2016 FINAL  Final   Organism ID, Bacteria ESCHERICHIA COLI (A)  Final      Susceptibility   Escherichia coli - MIC*    AMPICILLIN <=2 SENSITIVE Sensitive     CEFAZOLIN <=4 SENSITIVE Sensitive     CEFTRIAXONE <=1 SENSITIVE Sensitive     CIPROFLOXACIN <=0.25 SENSITIVE Sensitive     GENTAMICIN <=1 SENSITIVE Sensitive     IMIPENEM <=0.25 SENSITIVE Sensitive     NITROFURANTOIN <=16 SENSITIVE Sensitive     TRIMETH/SULFA <=20 SENSITIVE Sensitive     AMPICILLIN/SULBACTAM <=2 SENSITIVE Sensitive     PIP/TAZO <=4 SENSITIVE Sensitive     Extended ESBL NEGATIVE Sensitive     * >=100,000 COLONIES/mL ESCHERICHIA COLI  MRSA PCR Screening     Status: None   Collection Time: 06/26/16  9:39 PM  Result Value Ref Range Status   MRSA by PCR NEGATIVE NEGATIVE Final    Comment:        The GeneXpert MRSA Assay (FDA approved for NASAL specimens only), is one component of a comprehensive MRSA colonization surveillance program. It is not intended to diagnose MRSA infection nor to guide or monitor treatment for MRSA infections.   C difficile quick scan w PCR reflex     Status: Abnormal   Collection Time: 06/29/16 11:20 AM  Result Value Ref Range Status   C Diff antigen POSITIVE (A) NEGATIVE Final   C Diff toxin NEGATIVE  NEGATIVE Final   C Diff interpretation Results are indeterminate. See PCR results.  Final  Clostridium Difficile by PCR     Status: None   Collection Time: 06/29/16 11:20 AM  Result Value Ref Range Status   Toxigenic C Difficile by pcr NEGATIVE NEGATIVE Final    Comment: Patient is colonized  with non toxigenic C. difficile. May not need treatment unless significant symptoms are present.  Aerobic/Anaerobic Culture (surgical/deep wound)     Status: None (Preliminary result)   Collection Time: 07/01/16  1:00 PM  Result Value Ref Range Status   Specimen Description ABSCESS LEFT SHOULDER  Final   Special Requests NONE  Final   Gram Stain   Final    RARE WBC PRESENT,BOTH PMN AND MONONUCLEAR RARE GRAM POSITIVE COCCI IN PAIRS IN SINGLES    Culture   Final    FEW STAPHYLOCOCCUS AUREUS SUSCEPTIBILITIES TO FOLLOW Performed at [facility]    Report Status PENDING  Incomplete    Studies/Results: Ct Shoulder Left W Contrast  Result Date: 07/01/2016 CLINICAL DATA:  Left upper extremity cellulitis. Status post reversed left total shoulder arthroplasty. EXAM: CT OF THE UPPER LEFT EXTREMITY WITH CONTRAST TECHNIQUE: Multidetector CT imaging of the upper left extremity was performed according to the standard protocol following intravenous contrast administration. COMPARISON:  None. CONTRAST:  80m ISOVUE-300 IOPAMIDOL (ISOVUE-300) INJECTION 61% FINDINGS: Bones/Joint/Cartilage Left reverse total shoulder arthroplasty with beam hardening artifact partially obscuring the adjacent soft tissue and osseous structures. Normal alignment. No failure or complication. No acute fracture or dislocation. Mild arthropathy of the acromioclavicular joint.  Type II acromion. Ligaments Suboptimally assessed by CT. Muscles and Tendons Muscles are normal.  No intramuscular fluid collection or hematoma. Soft tissues Severe soft tissue edema in the subcutaneous fat along the posterior aspect of the shoulder and extending into the left upper arm concerning for cellulitis. No focal drainable fluid collection. IMPRESSION: 1. Severe soft tissue edema in the subcutaneous fat along the posterior aspect of the shoulder and extending into the left upper arm concerning for cellulitis. No focal fluid collection to suggest an  abscess. 2. 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 Electronically Signed   By: HKathreen Devoid  On: 07/01/2016 09:34    Assessment/Plan: AJENITA RAYFIELDis a 76y.o. female admitted with left upper extremity cellulitis as well as urinary tract infection and possible shingles eruption on her back.  She has had I and D of her shoulder abscess with no evidence of joint involvement. Cx with Staph aureus E coli bactermia as well from UTI source. ESR 106, CRP 31. Abscss cx growing Staph aureus- MRSA initial PCR was negative. Sensis pending  REC Would place picc and give 2 -4 week IV abx course If MSSA can use ancef (will also cover the E coli bacteremia and UTI) I have added ancef in place of zosyn but cont vanco until final sensis back from wound culture I will see in 2 weeks in fu   Thank you very much for the consult. Will follow with you.  Lio Wehrly P   07/01/2016, 5:00 PM

## 2016-07-01 NOTE — Progress Notes (Signed)
MEDICATION RELATED CONSULT NOTE - INITIAL   Pharmacy Consult for Electrolyte Management Indication: Hypokalemia  Allergies  Allergen Reactions  . Alendronate Other (See Comments)    Reaction:  Leg pain   . Atorvastatin Rash  . Formaldehyde Rash   Patient Measurements: Height: 4\' 11"  (149.9 cm) Weight: 123 lb 6.4 oz (56 kg) IBW/kg (Calculated) : 43.2  Vital Signs: Temp: 97.5 F (36.4 C) (01/08 1830) Temp Source: Oral (01/08 1830) BP: 142/72 (01/08 1830) Pulse Rate: 98 (01/08 1830) Intake/Output from previous day: 01/07 0701 - 01/08 0700 In: 1160 [P.O.:960; IV Piggyback:200] Out: -  Intake/Output from this shift: No intake/output data recorded.  Labs:  Recent Labs  06/29/16 0308 06/30/16 1352 07/01/16 0351 07/01/16 1837  WBC 14.0*  --  10.3  --   HGB 8.7*  --  9.1*  --   HCT 25.9*  --  27.0*  --   PLT 425  --  384  --   CREATININE  --  0.49 0.56  --   MG  --   --   --  1.2*   Estimated Creatinine Clearance: 44.9 mL/min (by C-G formula based on SCr of 0.56 mg/dL).  Medical History: Past Medical History:  Diagnosis Date  . Anxiety   . Arthritis   . Cervical myelopathy (HCC)   . Diabetes mellitus without complication (HCC)    Non Insulin dependant  . Headache    h/o migraines  . Hyperlipemia   . Hypertension   . Restless leg syndrome   . Stroke [facility](HCC) 2002   mini stroke  . TIA (transient ischemic attack)    approx 15 years ago  . Vertigo    hx of  . White matter disease     Assessment: Patient with hypokalemia (K 2.7) probably due to recent diarrhea. Pharmacy consulted to manage electrolytes.  Goal of Therapy:  Maintain electrolytes WNL  Plan:  Labs at 1830: K = 3.5, Mg = 1.2  Will order magnesium sulfate 4 g IV x 1 dose  Patient has current orders for 0.9% NaCl with KCl 20 mEq/L maintenance fluids infusing at 75 mL/hr and KCl 10 mEq PO daily. No additional supplementation needed at this time.   Will recheck electrolytes with AM labs  tomorrow.  Cindi CarbonMary M Roarke Marciano, PharmD, BCPS 07/01/2016 7:12 PM

## 2016-07-01 NOTE — Progress Notes (Signed)
Inpatient Diabetes Program Recommendations  AACE/ADA: New Consensus Statement on Inpatient Glycemic Control (2015)  Target Ranges:  Prepandial:   less than 140 mg/dL      Peak postprandial:   less than 180 mg/dL (1-2 hours)      Critically ill patients:  140 - 180 mg/dL   Results for Cheryl SchoonerBARFIELD, Shanaia C (MRN [id number]) as of 07/01/2016 08:53  Ref. Range 06/30/2016 08:00 06/30/2016 11:17 06/30/2016 16:46 06/30/2016 21:01 07/01/2016 08:18  Glucose-Capillary Latest Ref Range: 65 - 99 mg/dL 191101 (H) 478273 (H) 295249 (H) 218 (H) 133 (H)   Review of Glycemic Control  Diabetes history: DM2 Outpatient Diabetes medications: Amaryl 4 mg QAM, Metformin 1000 mg BID, Januvia 100 mg daily Current orders for Inpatient glycemic control: Novolog 0-9 units ACHS, Amaryl 1 mg QAM  Inpatient Diabetes Program Recommendations: Insulin - Meal Coverage: Patient was made NPO this morning. Once diet is resumed, please consider ordering Novolog 5 units TID with meals for meal coverage if patient eats at least 50% of meal.  Thanks, Orlando PennerMarie Laurelle Skiver, RN, MSN, CDE Diabetes Coordinator Inpatient Diabetes Program [phone] (Team Pager from 8am to 5pm)

## 2016-07-01 NOTE — Progress Notes (Signed)
Potasium 2.7. MD Sherryll BurgerShah notified. MD placing orders.

## 2016-07-01 NOTE — Progress Notes (Signed)
PT Cancellation Note  Patient Details Name: Cheryl Mueller MRN: [id number] DOB: Sep 26, 1940   Cancelled Treatment:    Reason Eval/Treat Not Completed: Patient at procedure or test/unavailable (Per chart review, patient currently off unit for sugical procedure (reverse total shoulder/component exchange).  Will re-attempt next date as medically appropriate pending receipt of new orders post-procedure.)   [name], PT, DPT, NCS 07/01/16, 2:21 PM [phone]

## 2016-07-02 ENCOUNTER — Encounter: Payer: Self-pay | Admitting: Surgery

## 2016-07-02 LAB — BASIC METABOLIC PANEL
ANION GAP: 7 (ref 5–15)
BUN: 11 mg/dL (ref 6–20)
CO2: 24 mmol/L (ref 22–32)
Calcium: 7.5 mg/dL — ABNORMAL LOW (ref 8.9–10.3)
Chloride: 107 mmol/L (ref 101–111)
Creatinine, Ser: 0.51 mg/dL (ref 0.44–1.00)
GFR calc Af Amer: >60 mL/min (ref >60)
GFR calc non Af Amer: >60 mL/min (ref >60)
GLUCOSE: 186 mg/dL — AB (ref 65–99)
POTASSIUM: 3.2 mmol/L — AB (ref 3.5–5.1)
Sodium: 138 mmol/L (ref 135–145)

## 2016-07-02 LAB — CBC WITH DIFFERENTIAL/PLATELET
BASOS PCT: 0 %
Basophils Absolute: 0 10*3/uL (ref 0–0.1)
EOS PCT: 0 %
Eosinophils Absolute: 0 10*3/uL (ref 0–0.7)
HEMATOCRIT: 25.1 % — AB (ref 35.0–47.0)
Hemoglobin: 8.4 g/dL — ABNORMAL LOW (ref 12.0–16.0)
LYMPHS PCT: 2 %
Lymphs Abs: 0.2 10*3/uL — ABNORMAL LOW (ref 1.0–3.6)
MCH: 29 pg (ref 26.0–34.0)
MCHC: 33.5 g/dL (ref 32.0–36.0)
MCV: 86.4 fL (ref 80.0–100.0)
MONO ABS: 0.3 10*3/uL (ref 0.2–0.9)
Monocytes Relative: 3 %
NEUTROS PCT: 95 %
Neutro Abs: 10.2 10*3/uL — ABNORMAL HIGH (ref 1.4–6.5)
Platelets: 422 10*3/uL (ref 150–440)
RBC: 2.9 MIL/uL — ABNORMAL LOW (ref 3.80–5.20)
RDW: 13.7 % (ref 11.5–14.5)
WBC: 10.7 10*3/uL (ref 3.6–11.0)

## 2016-07-02 LAB — CULTURE, BLOOD (ROUTINE X 2)

## 2016-07-02 LAB — POTASSIUM: Potassium: 2.9 mmol/L — ABNORMAL LOW (ref 3.5–5.1)

## 2016-07-02 LAB — GLUCOSE, CAPILLARY
GLUCOSE-CAPILLARY: 148 mg/dL — AB (ref 65–99)
GLUCOSE-CAPILLARY: 336 mg/dL — AB (ref 65–99)
GLUCOSE-CAPILLARY: 111 mg/dL — AB (ref 65–99)
Glucose-Capillary: 222 mg/dL — ABNORMAL HIGH (ref 65–99)

## 2016-07-02 LAB — VANCOMYCIN, TROUGH

## 2016-07-02 LAB — PHOSPHORUS: Phosphorus: 1.3 mg/dL — ABNORMAL LOW (ref 2.5–4.6)

## 2016-07-02 LAB — MAGNESIUM: Magnesium: 2.3 mg/dL (ref 1.7–2.4)

## 2016-07-02 MED ORDER — INSULIN ASPART 100 UNIT/ML ~~LOC~~ SOLN
4.0000 [IU] | Freq: Three times a day (TID) | SUBCUTANEOUS | Status: DC
  Administered 2016-07-02 – 2016-07-03 (×3): 4 [IU] via SUBCUTANEOUS
  Filled 2016-07-02 (×4): qty 4

## 2016-07-02 MED ORDER — POTASSIUM CHLORIDE 20 MEQ PO PACK
40.0000 meq | PACK | ORAL | Status: AC
  Administered 2016-07-02 (×2): 40 meq via ORAL
  Filled 2016-07-02 (×2): qty 2

## 2016-07-02 MED ORDER — INSULIN REGULAR HUMAN 100 UNIT/ML IJ SOLN: 4.0000 [IU] | Freq: Three times a day (TID) | INTRAMUSCULAR | Status: DC

## 2016-07-02 MED ORDER — CLONAZEPAM 0.5 MG PO TABS
0.2500 mg | ORAL_TABLET | Freq: Once | ORAL | Status: AC
  Administered 2016-07-02: 0.25 mg via ORAL
  Filled 2016-07-02: qty 1

## 2016-07-02 MED ORDER — CEFAZOLIN SODIUM-DEXTROSE 2-4 GM/100ML-% IV SOLN
2.0000 g | Freq: Three times a day (TID) | INTRAVENOUS | Status: DC
  Administered 2016-07-02 – 2016-07-03 (×2): 2 g via INTRAVENOUS
  Filled 2016-07-02 (×4): qty 100

## 2016-07-02 NOTE — Progress Notes (Signed)
Infectious Disease Long Term IV Antibiotic Orders  Diagnosis: L shoulder abscess with Staph aureus E coli UTI and bacteremia  Allergies:  Allergies  Allergen Reactions  . Alendronate Other (See Comments)    Reaction:  Leg pain   . Atorvastatin Rash  . Formaldehyde Rash    Discharge antibiotics Cefazolin      2 grams every  8 hours   PICC Care per protocol Labs weekly while on IV antibiotics      CBC w diff   Comprehensive met panel CRP   Planned duration of antibiotics 2-4 weeks from surgery 1/8  Stop date 07/17/15    Follow up clinic date within 2 weeks  FAX weekly labs to [phone]  Leonel Ramsay, MD

## 2016-07-02 NOTE — Progress Notes (Signed)
Patient ID: Cheryl Mueller, female   DOB: 08-22-1940, 76 y.o.   MRN: [id number]  [facility] PROGRESS NOTE  Cheryl Mueller WJX:[id number] DOB: [birth year] DOA: 06/26/2016 PCP: Curtis Sites III, MD  HPI/Subjective: Pain much better controlled. Arm in sling, sugars up  Objective: Vitals:   07/02/16 0803 07/02/16 1447  BP: (!) 141/79 (!) 176/68  Pulse: 82 93  Resp:    Temp: 98 F (36.7 C) 97.9 F (36.6 C)    Filed Weights   06/26/16 1436 06/27/16 0504  Weight: 61.7 kg (136 lb) 56 kg (123 lb 6.4 oz)    ROS: Review of Systems  Constitutional: Negative for chills and fever.  Eyes: Negative for blurred vision.  Respiratory: Negative for cough and shortness of breath.   Cardiovascular: Negative for chest pain.  Gastrointestinal: Negative for abdominal pain, constipation, melena, nausea and vomiting.  Genitourinary: Negative for dysuria and urgency.  Musculoskeletal: Positive for joint pain.  Neurological: Negative for dizziness and headaches.   Exam: Physical Exam  Constitutional: She is oriented to person, place, and time.  HENT:  Nose: No mucosal edema.  Mouth/Throat: No oropharyngeal exudate or posterior oropharyngeal edema.  Eyes: Conjunctivae, EOM and lids are normal. Pupils are equal, round, and reactive to light.  Neck: No JVD present. Carotid bruit is not present. No edema present. No thyroid mass and no thyromegaly present.  Cardiovascular: S1 normal and S2 normal.  Exam reveals no gallop.   No murmur heard. Respiratory: No respiratory distress. She has no decreased breath sounds. She has no wheezes. She has no rhonchi. She has no rales.  GI: Soft. Bowel sounds are normal. There is no tenderness.  Musculoskeletal:       Left shoulder: She exhibits decreased range of motion, tenderness and swelling.       Right ankle: She exhibits no swelling.       Left ankle: She exhibits no swelling.  Lymphadenopathy:    She has no cervical adenopathy.  Neurological: She is  alert and oriented to person, place, and time. No cranial nerve deficit.  Skin: Skin is warm. Nails show no clubbing.  Lt should in sling, tender to touch Rash on her back has healing scabs. It crosses the midline.  Psychiatric: She has a normal mood and affect.     Data Reviewed: Basic Metabolic Panel:  Recent Labs Lab 06/26/16 1439 06/27/16 0440 06/28/16 0420 06/30/16 1352 07/01/16 0351 07/01/16 1306 07/01/16 1837 07/02/16 0452  NA 131* 134* 136  --  138  --   --  138  K 3.3* 3.2* 3.5  --  2.7* 2.9* 3.5 3.2*  CL 93* 106 106  --  104  --   --  107  CO2 23 23 24   --  28  --   --  24  GLUCOSE 242* 42* 124*  --  147*  --   --  186*  BUN 29* 23* 24*  --  9  --   --  11  CREATININE 1.20* 0.73 0.86 0.49 0.56  --   --  0.51  CALCIUM 9.9 8.2* 7.8*  --  7.8*  --   --  7.5*  MG  --   --   --   --   --   --  1.2* 2.3   Liver Function Tests:  Recent Labs Lab 06/26/16 1439  AST 56*  ALT 26  ALKPHOS 132*  BILITOT <0.1*  PROT 6.8  ALBUMIN 2.9*   CBC:  Recent Labs Lab 06/26/16 1439 06/27/16 0440 06/28/16 0420 06/29/16 0308 07/01/16 0351 07/02/16 0452  WBC 28.2* 21.0* 17.3* 14.0* 10.3 10.7  NEUTROABS 26.7*  --   --   --   --  10.2*  HGB 10.2* 8.7* 8.3* 8.7* 9.1* 8.4*  HCT 30.5* 25.8* 24.9* 25.9* 27.0* 25.1*  MCV 86.5 85.9 86.7 85.4 86.3 86.4  PLT 575* 430 413 425 384 422    CBG:  Recent Labs Lab 07/01/16 1417 07/01/16 1652 07/01/16 2156 07/02/16 0801 07/02/16 1112  GLUCAP 121* 151* 309* 148* 336*    Recent Results (from the past 240 hour(s))  Blood culture (routine x 2)     Status: Abnormal   Collection Time: 06/26/16  3:48 PM  Result Value Ref Range Status   Specimen Description BLOOD RIGHT AC  Final   Special Requests   Final    BOTTLES DRAWN AEROBIC AND ANAEROBIC AER ANA   Culture  Setup Time   Final    GRAM NEGATIVE RODS AEROBIC BOTTLE ONLY CRITICAL RESULT CALLED TO, READ BACK BY AND VERIFIED WITH: MELLISSA MACCIA ON 06/30/16 AT 1103  [facility] Performed at [facility]    Culture ESCHERICHIA COLI (A)  Final   Report Status 07/02/2016 FINAL  Final   Organism ID, Bacteria ESCHERICHIA COLI  Final      Susceptibility   Escherichia coli - MIC*    AMPICILLIN 4 SENSITIVE Sensitive     CEFAZOLIN <=4 SENSITIVE Sensitive     CEFEPIME <=1 SENSITIVE Sensitive     CEFTAZIDIME <=1 SENSITIVE Sensitive     CEFTRIAXONE <=1 SENSITIVE Sensitive     CIPROFLOXACIN <=0.25 SENSITIVE Sensitive     GENTAMICIN <=1 SENSITIVE Sensitive     IMIPENEM <=0.25 SENSITIVE Sensitive     TRIMETH/SULFA <=20 SENSITIVE Sensitive     AMPICILLIN/SULBACTAM <=2 SENSITIVE Sensitive     PIP/TAZO <=4 SENSITIVE Sensitive     Extended ESBL NEGATIVE Sensitive     * ESCHERICHIA COLI  Blood Culture ID Panel (Reflexed)     Status: Abnormal   Collection Time: 06/26/16  3:48 PM  Result Value Ref Range Status   Enterococcus species NOT DETECTED NOT DETECTED Final   Listeria monocytogenes NOT DETECTED NOT DETECTED Final   Staphylococcus species NOT DETECTED NOT DETECTED Final   Staphylococcus aureus NOT DETECTED NOT DETECTED Final   Streptococcus species NOT DETECTED NOT DETECTED Final   Streptococcus agalactiae NOT DETECTED NOT DETECTED Final   Streptococcus pneumoniae NOT DETECTED NOT DETECTED Final   Streptococcus pyogenes NOT DETECTED NOT DETECTED Final   Acinetobacter baumannii NOT DETECTED NOT DETECTED Final   Enterobacteriaceae species DETECTED (A) NOT DETECTED Final    Comment: CRITICAL RESULT CALLED TO, READ BACK BY AND VERIFIED WITH: MELLISSA MACCIA ON 06/30/16 AT 1103 [facility]    Enterobacter cloacae complex NOT DETECTED NOT DETECTED Final   Escherichia coli DETECTED (A) NOT DETECTED Final    Comment: CRITICAL RESULT CALLED TO, READ BACK BY AND VERIFIED WITH: MELLISSA MACCIA ON 06/30/16 AT 1103 [facility]    Klebsiella oxytoca NOT DETECTED NOT DETECTED Final   Klebsiella pneumoniae NOT DETECTED NOT DETECTED Final   Proteus species NOT DETECTED NOT DETECTED Final    Serratia marcescens NOT DETECTED NOT DETECTED Final   Carbapenem resistance NOT DETECTED NOT DETECTED Final   Haemophilus influenzae NOT DETECTED NOT DETECTED Final   Neisseria meningitidis NOT DETECTED NOT DETECTED Final   Pseudomonas aeruginosa NOT DETECTED NOT DETECTED Final   Candida albicans NOT DETECTED NOT  DETECTED Final   Candida glabrata NOT DETECTED NOT DETECTED Final   Candida krusei NOT DETECTED NOT DETECTED Final   Candida parapsilosis NOT DETECTED NOT DETECTED Final   Candida tropicalis NOT DETECTED NOT DETECTED Final  Blood culture (routine x 2)     Status: None   Collection Time: 06/26/16  4:27 PM  Result Value Ref Range Status   Specimen Description BLOOD LEFT FOREARM  Final   Special Requests   Final    BOTTLES DRAWN AEROBIC AND ANAEROBIC AER ANA   Culture NO GROWTH 5 DAYS  Final   Report Status 07/01/2016 FINAL  Final  Urine culture     Status: Abnormal   Collection Time: 06/26/16  6:01 PM  Result Value Ref Range Status   Specimen Description URINE, CATHETERIZED  Final   Special Requests NONE  Final   Culture >=100,000 COLONIES/mL ESCHERICHIA COLI (A)  Final   Report Status 06/30/2016 FINAL  Final   Organism ID, Bacteria ESCHERICHIA COLI (A)  Final      Susceptibility   Escherichia coli - MIC*    AMPICILLIN <=2 SENSITIVE Sensitive     CEFAZOLIN <=4 SENSITIVE Sensitive     CEFTRIAXONE <=1 SENSITIVE Sensitive     CIPROFLOXACIN <=0.25 SENSITIVE Sensitive     GENTAMICIN <=1 SENSITIVE Sensitive     IMIPENEM <=0.25 SENSITIVE Sensitive     NITROFURANTOIN <=16 SENSITIVE Sensitive     TRIMETH/SULFA <=20 SENSITIVE Sensitive     AMPICILLIN/SULBACTAM <=2 SENSITIVE Sensitive     PIP/TAZO <=4 SENSITIVE Sensitive     Extended ESBL NEGATIVE Sensitive     * >=100,000 COLONIES/mL ESCHERICHIA COLI  MRSA PCR Screening     Status: None   Collection Time: 06/26/16  9:39 PM  Result Value Ref Range Status   MRSA by PCR NEGATIVE NEGATIVE Final    Comment:        The  GeneXpert MRSA Assay (FDA approved for NASAL specimens only), is one component of a comprehensive MRSA colonization surveillance program. It is not intended to diagnose MRSA infection nor to guide or monitor treatment for MRSA infections.   C difficile quick scan w PCR reflex     Status: Abnormal   Collection Time: 06/29/16 11:20 AM  Result Value Ref Range Status   C Diff antigen POSITIVE (A) NEGATIVE Final   C Diff toxin NEGATIVE NEGATIVE Final   C Diff interpretation Results are indeterminate. See PCR results.  Final  Clostridium Difficile by PCR     Status: None   Collection Time: 06/29/16 11:20 AM  Result Value Ref Range Status   Toxigenic C Difficile by pcr NEGATIVE NEGATIVE Final    Comment: Patient is colonized with non toxigenic C. difficile. May not need treatment unless significant symptoms are present.  Aerobic/Anaerobic Culture (surgical/deep wound)     Status: None (Preliminary result)   Collection Time: 07/01/16  1:00 PM  Result Value Ref Range Status   Specimen Description ABSCESS LEFT SHOULDER  Final   Special Requests NONE  Final   Gram Stain   Final    RARE WBC PRESENT,BOTH PMN AND MONONUCLEAR RARE GRAM POSITIVE COCCI IN PAIRS IN SINGLES    Culture   Final    FEW STAPHYLOCOCCUS AUREUS SUSCEPTIBILITIES TO FOLLOW Performed at [facility]    Report Status PENDING  Incomplete     Studies: Ct Shoulder Left W Contrast  Result Date: 07/01/2016 CLINICAL DATA:  Left upper extremity cellulitis. Status post reversed left total shoulder  arthroplasty. EXAM: CT OF THE UPPER LEFT EXTREMITY WITH CONTRAST TECHNIQUE: Multidetector CT imaging of the upper left extremity was performed according to the standard protocol following intravenous contrast administration. COMPARISON:  None. CONTRAST:  75mL ISOVUE-300 IOPAMIDOL (ISOVUE-300) INJECTION 61% FINDINGS: Bones/Joint/Cartilage Left reverse total shoulder arthroplasty with beam hardening artifact partially obscuring the  adjacent soft tissue and osseous structures. Normal alignment. No failure or complication. No acute fracture or dislocation. Mild arthropathy of the acromioclavicular joint.  Type II acromion. Ligaments Suboptimally assessed by CT. Muscles and Tendons Muscles are normal.  No intramuscular fluid collection or hematoma. Soft tissues Severe soft tissue edema in the subcutaneous fat along the posterior aspect of the shoulder and extending into the left upper arm concerning for cellulitis. No focal drainable fluid collection. IMPRESSION: 1. Severe soft tissue edema in the subcutaneous fat along the posterior aspect of the shoulder and extending into the left upper arm concerning for cellulitis. No focal fluid collection to suggest an abscess. 2. 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 Electronically Signed   By: Elige Ko   On: 07/01/2016 09:34    Scheduled Meds: . amLODipine  10 mg Oral Daily  . atorvastatin  80 mg Oral QHS  . docusate sodium  100 mg Oral BID  . enoxaparin (LOVENOX) injection  40 mg Subcutaneous Q24H  . ferrous sulfate  325 mg Oral TID PC  . glimepiride  1 mg Oral Q breakfast  . insulin aspart  0-9 Units Subcutaneous TID WC & HS  . insulin aspart  4 Units Subcutaneous TID AC  . lidocaine  1 patch Transdermal Q12H  . potassium chloride  10 mEq Oral Daily  . potassium chloride  40 mEq Oral Q4H  . rOPINIRole  0.25 mg Oral QHS  . traZODone  25 mg Oral QHS  . vancomycin  500 mg Intravenous Q24H  . venlafaxine XR  75 mg Oral Daily    Assessment/Plan:  1. Escherichia coli bacteremia and Left shoulder abscess: s/p Irrigation and debridement of posterolateral left shoulder abscess with application of wound VAC. Aspiration of left glenohumeral joint on 1/8  - Appreciate ID input - Abscss cx growing Staph aureus- MRSA initial PCR was negative. pending culture sensitivity - picc and give  2-4 week IV abx per ID - Changed to IV Ancef in place of zosyn but cont vanco until final c/s back from wound culture per ID - will need 2 weeks outpt f/up with ID  2. Hypokalemia/Hypomagnesemia: replete and recheck, 3. Escherichia coli UTI: Continue IV Ancef 4. Diabetes mellitus :  Continue SSI and amaryl, add NovoLog 4 units 3 times a day per diabetic nurse coordinator recommendations 5. Weakness. Physical therapy recommends rehabilitation, has bed [facility] 6. Essential hypertension on Norvasc 7. Hyperlipidemia unspecified on atorvastatin 8. Depression on Effexor  Code Status:     Code Status Orders        Start     Ordered   06/26/16 2031  Full code  Continuous     06/26/16 2031    Code Status History    Date Active Date Inactive Code Status Order ID Comments User Context   04/16/2016 12:08 PM 04/17/2016  6:41 PM Full Code [id number]  Christena Flake, MD Inpatient   10/13/2015  5:09 PM 10/18/2015  2:35 PM Full Code [id number]  Enid Baas, MD Inpatient   04/17/2015  4:30 PM 04/21/2015  2:23 PM Full Code [id number]  Lynnea FerrierBert J Klein III, MD Inpatient   03/23/2015  2:27 PM 03/23/2015  5:50 PM Full Code [id number]  Kennedy BuckerMichael Menz, MD Inpatient   11/26/2012  4:41 PM 11/27/2012  1:02 PM Full Code [id number]  Hewitt Shortsobert W Nudelman, MD Inpatient     Family Communication: Son at bedside Disposition Plan: [facility] with [facility] line and 2-4 weeks of IV antibiotics  Consultants:  Infectious disease  Orthopedic surgery  Antibiotics:  Vancomycin   Ancef  Time spent: 25 minutes  Jacquelin Krajewski [employer]  [employer]

## 2016-07-02 NOTE — Plan of Care (Signed)
Problem: Bowel/Gastric: Goal: Will not experience complications related to bowel motility Outcome: Progressing Pt received picc line today, is progressing toward goal of d/c to sn[facility].

## 2016-07-02 NOTE — Progress Notes (Signed)
Inpatient Diabetes Program Recommendations  AACE/ADA: New Consensus Statement on Inpatient Glycemic Control (2015)  Target Ranges:  Prepandial:   less than 140 mg/dL      [facility] postprandial:   less than 180 mg/dL (1-2 hours)      Critically ill patients:  140 - 180 mg/dL   Results for Cheryl Mueller, Cheryl Mueller (MRN [id number]) as of 07/02/2016 09:04  Ref. Range 07/01/2016 08:18 07/01/2016 11:37 07/01/2016 14:17 07/01/2016 16:52 07/01/2016 21:56 07/02/2016 08:01  Glucose-Capillary Latest Ref Range: 65 - 99 mg/dL 782133 (H) 956150 (H) 213121 (H) 151 (H) 309 (H) 148 (H)   Review of Glycemic Control  Diabetes history: DM2 Outpatient Diabetes medications: Amaryl 4 mg QAM, Metformin 1000 mg BID, Januvia 100 mg daily Current orders for Inpatient glycemic control: Novolog 0-9 units ACHS, Amaryl 1 mg QAM  Inpatient Diabetes Program Recommendations: Insulin - Meal Coverage: Noted patient was NPO yesterday morning and diet reordered at 19:06 on 07/01/16 and glucose up to 309 mg/dl on 0/8/651/8/18 at 78:4621:56 after eating. Please consider ordering Novolog 4 units TID with meals for meal coverage if patient eats at least 50% of meal.  Thanks, Orlando PennerMarie Kateline Kinkade, RN, MSN, CDE Diabetes Coordinator Inpatient Diabetes Program [phone] (Team Pager from 8am to 5pm)

## 2016-07-02 NOTE — Progress Notes (Addendum)
Plan is for patient to D/C to [facility] tomorrow pending medical clearance. [facility] liaison is aware of above.   Per RN patient has a small provena wound vac on her shoulder that can D/C with her to [facility]. Per MD patient will need PICC line for IV Abx. Jomarie LongsJoseph is aware of above.   Baker Hughes IncorporatedBailey Devine Dant, LCSW [phone]

## 2016-07-02 NOTE — Progress Notes (Signed)
Shift assessment completed, pt at 0805 is sitting up in bed with breakfast in front of her, and is using both arms to fix up her breakfast tray, pt then stated she would need this writer to feed her because her arms wont work right, claimed to be L handed, and has a sling on L arm which does nto appear to be impeding movement. Pt is alert to self and place, situation, stated she feels much better.  During assessment, pt repeated same phrase over and over, was difficult to redirect, pt would not cooperate much with meds until she had eaten breakfast. Pt is on room air, lungs are clear bilat, HR isregular. Abdomen is soft, bs heard. Pt has incontinence brief in place. PIV #20 to r hand intact and site free of redness and swelling,. ivf restarted and pt repeatedly complained of pain to the site, interrupting all other tasks multiple times. Ppp. L shoulder has provena wound vac intact, dressing is dry, some redness noted below shoulder on upper arm, pt stated she feels much better since surgery. This Probation officer returned to room at 090 to finish tasks, pt now oob to chair vai physical therapy and complaining she wants to go back to bed and needs to be changed for incontinence, her iv hurts, etc. This Probation officer dc'd pt's iv site at her requested and placed piv #22 to her R ac area, pt tolerated well, ivf continues. Pt took am meds, and began to complain about lack of care she has suffered since being here. This Probation officer explained that pt's needs will be met by staff, but that others also need care as well. Lidoderm patch placed to pt's l back, some scabbing noted, pt had no complaints of pain or itching. Call bell in reach.

## 2016-07-02 NOTE — Progress Notes (Signed)
OT Cancellation Note  Patient Details Name: Cheryl Mueller MRN: [id number] DOB: August 13, 1940   Cancelled Treatment:    Reason Eval/Treat Not Completed: Patient declined, no reason specified. Order received and chart reviewed.  Pt recently assisted back to bed with assist of 2 people and declined OT eval due to fatigue and pain.  Will attempt again tomorrow.  Thank you for the referral.  Susanne BordersSusan Wofford, OTR/L ascom [phone] 07/02/16, 4:17 PM

## 2016-07-02 NOTE — Progress Notes (Signed)
Pt called and requested to be changed and returned to bed. Pt transferred with two assist and gait belt, pt did minimal work with weight bearing, stating "im just a pitiful woman". Pt was changed, peri area is becoming excoriated/rashy to groin, brief left off pt due to this and cream applied. Pt told this Clinical research associatewriter that the doctor told her that she was going get a picc line before leaving the hospital. This writer has called vascular wellness to schedule picc placement.

## 2016-07-02 NOTE — Anesthesia Preprocedure Evaluation (Signed)
Anesthesia Evaluation  Patient identified by MRN, date of birth, ID band Patient awake    Reviewed: Allergy & Precautions, H&P , NPO status , Patient's Chart, lab work & pertinent test results, reviewed documented beta blocker date and time   Airway Mallampati: II   Neck ROM: full    Dental  (+) Poor Dentition   Pulmonary neg pulmonary ROS,    Pulmonary exam normal        Cardiovascular hypertension, negative cardio ROS Normal cardiovascular exam Rhythm:regular Rate:Normal     Neuro/Psych  Headaches, PSYCHIATRIC DISORDERS Anxiety TIACVA negative neurological ROS  negative psych ROS   GI/Hepatic negative GI ROS, Neg liver ROS,   Endo/Other  negative endocrine ROSdiabetes  Renal/GU Renal diseasenegative Renal ROS  negative genitourinary   Musculoskeletal  (+) Arthritis ,   Abdominal   Peds  Hematology negative hematology ROS (+) anemia ,   Anesthesia Other Findings Past Medical History: No date: Anxiety No date: Arthritis No date: Cervical myelopathy (HCC) No date: Diabetes mellitus without complication (HCC)     Comment: Non Insulin dependant No date: Headache     Comment: h/o migraines No date: Hyperlipemia No date: Hypertension No date: Restless leg syndrome 2002: Stroke [facility](HCC)     Comment: mini stroke No date: TIA (transient ischemic attack)     Comment: approx 15 years ago No date: Vertigo     Comment: hx of No date: White matter disease Past Surgical History: 2012: BACK SURGERY     Comment: kyphoplasty lower back No date: COLONOSCOPY W/ POLYPECTOMY 10/16/2015: DEBRIDEMENT OF ABDOMINAL WALL ABSCESS N/A     Comment: Procedure: I & D OF ABDOMINAL WALL ABSCESS;                Surgeon: Lattie Hawichard E Cooper, MD;  Location: [facility]               ORS;  Service: General;  Laterality: N/A; 04/19/2015: KYPHOPLASTY N/A     Comment: Procedure: KYPHOPLASTY L 1;  Surgeon: Kennedy BuckerMichael               Menz, MD;  Location:  [facility] ORS;  Service:               Orthopedics;  Laterality: N/A; 02/27/2016: KYPHOPLASTY N/A     Comment: Procedure: KYPHOPLASTY;  Surgeon: Kennedy BuckerMichael               Menz, MD;  Location: [facility] ORS;  Service:               Orthopedics;  Laterality: N/A; 11/26/2012: LUMBAR LAMINECTOMY/DECOMPRESSION MICRODISCECTO* Right     Comment: Procedure: LUMBAR LAMINECTOMY/DECOMPRESSION               MICRODISCECTOMY 1 LEVEL;  Surgeon: Hewitt Shortsobert W               Nudelman, MD;  Location: [facility];                Service: Neurosurgery;  Laterality: Right;                Lumbar five-sacral one laminotomy and               microdiskectomy  03/23/2015: ORIF PATELLA Left     Comment: Procedure: OPEN REDUCTION INTERNAL (ORIF)               FIXATION PATELLA;  Surgeon: Kennedy BuckerMichael Menz, MD;  Location: [facility] ORS;  Service: Orthopedics;                Laterality: Left; 04/16/2016: REVERSE SHOULDER ARTHROPLASTY Left     Comment: Procedure: REVERSE SHOULDER ARTHROPLASTY;                Surgeon: Christena Flake, MD;  Location: [facility] ORS;              Service: Orthopedics;  Laterality: Left; BMI    Body Mass Index:  24.92 kg/m     Reproductive/Obstetrics negative OB ROS                             Anesthesia Physical Anesthesia Plan  ASA: III  Anesthesia Plan: General   Post-op Pain Management:    Induction:   Airway Management Planned:   Additional Equipment:   Intra-op Plan:   Post-operative Plan:   Informed Consent: I have reviewed the patients History and Physical, chart, labs and discussed the procedure including the risks, benefits and alternatives for the proposed anesthesia with the patient or authorized representative who has indicated his/her understanding and acceptance.   Dental Advisory Given  Plan Discussed with: CRNA  Anesthesia Plan Comments:         Anesthesia Quick Evaluation

## 2016-07-02 NOTE — Progress Notes (Signed)
MEDICATION RELATED CONSULT NOTE  Pharmacy Consult for Electrolyte Management  Pharmacy consulted for electrolyte management for 76 yo female presenting with hypokalemia and diarrhea. Patient received potassium 40mEq PO x 2 on 1/9 and is currently receiving 0.9% NaCl with KCl 20 mEq/L at 7175mL/hr.   Goal of Therapy:  Maintain electrolytes WNL  Plan:  Potassium remains low despite supplementation and IVF. Will replace potassium 40mEq PO Q4hr x 3 doses. Will recheck potassium and phosphorus at 1730.     Allergies  Allergen Reactions  . Alendronate Other (See Comments)    Reaction:  Leg pain   . Atorvastatin Rash  . Formaldehyde Rash   Patient Measurements: Height: 4\' 11"  (149.9 cm) Weight: 123 lb 6.4 oz (56 kg) IBW/kg (Calculated) : 43.2  Vital Signs: Temp: 98.4 F (36.9 C) (01/08 1940) Temp Source: Oral (01/08 1940) BP: 135/94 (01/08 1940) Pulse Rate: 102 (01/08 1940) Intake/Output from previous day: 01/08 0701 - 01/09 0700 In: 2077.5 [P.O.:600; I.V.:1077.5; IV Piggyback:400] Out: 1080 [Urine:980; Blood:100] Intake/Output from this shift: Total I/O In: 1177.5 [P.O.:600; I.V.:177.5; IV Piggyback:400] Out: 700 [Urine:700]  Labs:  Recent Labs  06/30/16 1352 07/01/16 0351 07/01/16 1837 07/02/16 0452  WBC  --  10.3  --  10.7  HGB  --  9.1*  --  8.4*  HCT  --  27.0*  --  25.1*  PLT  --  384  --  422  CREATININE 0.49 0.56  --  0.51  MG  --   --  1.2* 2.3   Estimated Creatinine Clearance: 44.9 mL/min (by C-G formula based on SCr of 0.51 mg/dL).  Medical History: Past Medical History:  Diagnosis Date  . Anxiety   . Arthritis   . Cervical myelopathy (HCC)   . Diabetes mellitus without complication (HCC)    Non Insulin dependant  . Headache    h/o migraines  . Hyperlipemia   . Hypertension   . Restless leg syndrome   . Stroke [facility](HCC) 2002   mini stroke  . TIA (transient ischemic attack)    approx 15 years ago  . Vertigo    hx of  . White matter disease      Pharmacy will continue to monitor and adjust per consult.   Cheryl Mueller L 07/02/2016 7:00 AM

## 2016-07-02 NOTE — Progress Notes (Signed)
Pharmacy Antibiotic Note  Cheryl Mueller C Cheryl Mueller is a 76 y.o. female admitted on 06/26/2016 with E.Coli bacteremia and E.Coli UTI.  Pharmacy has been consulted for Cefazolin  Dosing. Patient is also on Vancomycin.   Plan: Will start the patient on Cefazolin 2gm IV every 8 hours. Vancomycin continued by ID until senitivities are back on  Aerobic/Anaerobic culture.   Height: 4\' 11"  (149.9 cm) Weight: 123 lb 6.4 oz (56 kg) IBW/kg (Calculated) : 43.2  Temp (24hrs), Avg:98.1 F (36.7 C), Min:97.2 F (36.2 C), Max:99 F (37.2 C)   Recent Labs Lab 06/26/16 1548 06/26/16 1858 06/27/16 0440 06/28/16 0420 06/29/16 0308 06/30/16 1352 07/01/16 0351 07/02/16 0452  WBC  --   --  21.0* 17.3* 14.0*  --  10.3 10.7  CREATININE  --   --  0.73 0.86  --  0.49 0.56 0.51  LATICACIDVEN 3.5* 2.2*  --   --   --   --   --   --     Estimated Creatinine Clearance: 44.9 mL/min (by C-G formula based on SCr of 0.51 mg/dL).    Allergies  Allergen Reactions  . Alendronate Other (See Comments)    Reaction:  Leg pain   . Atorvastatin Rash  . Formaldehyde Rash    Antimicrobials this admission: 1/5 Vancomycin  >>  1/3 Zosyn  >> 1/9 1/9 Cefazolin >>  Dose adjustments this admission:   Microbiology results: 1/3 BCx: E.Coli,  1/3 UCx: E.Coli 1/8 Aerobic/Anaerobic Cx: Staph aureus  1/3 MRSA PCR: negative   Thank you for allowing pharmacy to be a part of this patient's care.  Gardner CandleSheema M Raquel Racey, PharmD, BCPS Clinical Pharmacist 07/02/2016 3:07 PM

## 2016-07-02 NOTE — Progress Notes (Signed)
Pt has multiple complains as of this time. Briefs, gown and underpads has been changed thrice since the start of the shift, pain medicine given, all due night pills given, pt placed on 2liters of O2 inhalation for a complain of "running after my breath". Pt saturating 98% at room air prior and no obvious sign of any respiratory distress at this moment. Despite of nursing interventions, pt still complaining of "not feeling good, I'm miserable, do something about my legs". Paged prime doc Hugelmyer, made aware about the restless legs, ordered Klonopin 0.25mg  PO one time dose. Will administer as ordered and continue to monitor.

## 2016-07-02 NOTE — Evaluation (Signed)
Physical Therapy Re-Evaluation Patient Details Name: Cheryl Mueller MRN: [id number] DOB: 10/05/1940 Today's Date: 07/02/2016   History of Present Illness  76 y.o. 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  She had returned home after rehab (and was somewhat struggling) and is now back with drainage/cellulitis and had I&D of L shoulder 07/02/15.    Clinical Impression  Pt re-evaluated by PT post L shoulder I&D.  She is extremely weak and functionally limited and struggled to even maintain balance at EOB with good set up, good hold on rail and heavy cuing.  Pt essentially unable to assist with standing/transfer to recliner and is in no way able to go home or do any mobility w/o excessive assistance at this time. Pt was willing to participate but highly limited.     Follow Up Recommendations SNF    Equipment Recommendations       Recommendations for Other Services       Precautions / Restrictions Precautions Precautions: Fall Required Braces or Orthoses: Sling Restrictions Weight Bearing Restrictions: Yes LLE Weight Bearing: Non weight bearing      Mobility  Bed Mobility Overal bed mobility: Needs Assistance Bed Mobility: Supine to Sit     Supine to sit: Total assist     General bed mobility comments: Pt is extremely weak, showed very little ability to keep herself upright and even with good UE grip on rail needed heavy assist to keep from falling back onto bed  Transfers Overall transfer level: Needs assistance Equipment used: Hemi-walker Transfers: Sit to/from Stand Sit to Stand: Max assist         General transfer comment: Pt able to show only a very minimal amount of effort and kept trying to use her L UE on the hemiwalker to  maintain balance/upright.  Total assist to transfer bed to chair with complete inability to move her LEs at all and essentially a dependent stand pivot with minimal ability to use the AD to assist  Ambulation/Gait                Stairs            Wheelchair Mobility    Modified Rankin (Stroke Patients Only)       Balance Overall balance assessment: Needs assistance   Sitting balance-Leahy Scale: Poor Sitting balance - Comments: Pt showed some effort in using UEs to hold herself up but ultimately was very weak and unable to maintain balance     Standing balance-Leahy Scale: Zero Standing balance comment: Pt was total assist to maintain balance in standing and showed very poor tolerance                             Pertinent Vitals/Pain Pain Assessment: 0-10 Pain Score: 4  (reports more back, sacral pain that shoulder pain)    Home Living Family/patient expects to be discharged to:: Skilled nursing facility   Available Help at Discharge: Family   Home Access: Stairs to enter Entrance Stairs-Rails: Can reach both Entrance Stairs-Number of Steps: 4 Home Layout: Two level;Able to live on main level with bedroom/bathroom Home Equipment: Dan Humphreys - 2 wheels;Walker - 4 wheels;Cane - single point;Shower seat (reports she may have a hemiwalker too?)      Prior Function  Level of Independence: Needs assistance   Gait / Transfers Assistance Needed: Pt reports she has been able to do only limited walking since total shoulder mid October.      Comments: Pt has been recently limited and quite weak     Hand Dominance        Extremity/Trunk Assessment   Upper Extremity Assessment Upper Extremity Assessment: RUE deficits/detail (L UE very limited, no resisted activities tested, NWBing) RUE Deficits / Details: R UE grossly 3+/5, showed good effort but generally very weak    Lower Extremity Assessment Lower Extremity Assessment: Generalized weakness (Pt is  very weak, grossly 3/5 )       Communication   Communication: No difficulties  Cognition Arousal/Alertness: Awake/alert Behavior During Therapy: Anxious Overall Cognitive Status: Difficult to assess                 General Comments: Pt repeating herself t/o session somewhat confused/scattered    General Comments      Exercises     Assessment/Plan    PT Assessment Patient needs continued PT services  PT Problem List Decreased strength;Decreased range of motion;Decreased activity tolerance;Decreased balance;Decreased mobility;Decreased safety awareness;Pain          PT Treatment Interventions Therapeutic activities;Therapeutic exercise;Balance training;Neuromuscular re-education;Functional mobility training;Gait training    PT Goals (Current goals can be found in the Care Plan section)  Acute Rehab PT Goals Patient Stated Goal: get stronger at rehab PT Goal Formulation: With patient Time For Goal Achievement: 07/16/16 Potential to Achieve Goals: Fair    Frequency Min 2X/week   Barriers to discharge        Co-evaluation               End of Session Equipment Utilized During Treatment: Gait belt (L sling) Activity Tolerance: Patient limited by fatigue Patient left: with chair alarm set;with call bell/phone within reach           Time: 0822-0843 PT Time Calculation (min) (ACUTE ONLY): 21 min   Charges:   PT Evaluation $PT Re-evaluation: 1 Procedure     PT G Codes:        Malachi ProGalen R Gabriele Loveland, DPT 07/02/2016, 10:26 AM

## 2016-07-02 NOTE — Progress Notes (Signed)
Subjective: 1 Day Post-Op Procedure(s) (LRB): IRRIGATION AND DEBRIDEMENT SHOULDER (Left) APPLICATION OF WOUND VAC ARTHROCENTESIS ASPIRATION LARGE JOINT (N/A) Patient reports pain as mild.  Improved compared to yesterday. Patient is well, and has had no acute complaints or problems Care management and PT to assist with discharge planning. Negative for chest pain and shortness of breath Fever: No fever this AM. Gastrointestinal:Negative for nausea and vomiting  Objective: Vital signs in last 24 hours: Temp:  [97.2 F (36.2 C)-99 F (37.2 C)] 98.4 F (36.9 C) (01/08 1940) Pulse Rate:  [87-103] 102 (01/08 1940) Resp:  [11-22] 16 (01/08 1830) BP: (135-163)/(64-127) 135/94 (01/08 1940) SpO2:  [94 %-100 %] 97 % (01/08 1940) FiO2 (%):  [21 %] 21 % (01/08 1751)  Intake/Output from previous day:  Intake/Output Summary (Last 24 hours) at 07/02/16 0730 Last data filed at 07/02/16 0630  Gross per 24 hour  Intake           2077.5 ml  Output             1080 ml  Net            997.5 ml    Intake/Output this shift: No intake/output data recorded.  Labs:  Recent Labs  07/01/16 0351 07/02/16 0452  HGB 9.1* 8.4*    Recent Labs  07/01/16 0351 07/02/16 0452  WBC 10.3 10.7  RBC 3.13* 2.90*  HCT 27.0* 25.1*  PLT 384 422    Recent Labs  07/01/16 0351  07/01/16 1837 07/02/16 0452  NA 138  --   --  138  K 2.7*  < > 3.5 3.2*  CL 104  --   --  107  CO2 28  --   --  24  BUN 9  --   --  11  CREATININE 0.56  --   --  0.51  GLUCOSE 147*  --   --  186*  CALCIUM 7.8*  --   --  7.5*  < > = values in this interval not displayed. No results for input(s): LABPT, INR in the last 72 hours.   EXAM General - Patient is Alert, Appropriate and Oriented Extremity - ABD soft Sensation intact distally Intact pulses distally  Woundvac in place, no active drainage. Erythema appears to be improved compared to at the time of surgery, less pain with palpation of the area, no erythema  present over the anterior shoulder incision. Dressing/Incision - Woundvac in place. Motor Function - intact, moving foot and toes well on exam. Intact to light touch over the left deltoid.  Past Medical History:  Diagnosis Date  . Anxiety   . Arthritis   . Cervical myelopathy (HCC)   . Diabetes mellitus without complication (HCC)    Non Insulin dependant  . Headache    h/o migraines  . Hyperlipemia   . Hypertension   . Restless leg syndrome   . Stroke [facility](HCC) 2002   mini stroke  . TIA (transient ischemic attack)    approx 15 years ago  . Vertigo    hx of  . White matter disease     Assessment/Plan: 1 Day Post-Op Procedure(s) (LRB): IRRIGATION AND DEBRIDEMENT SHOULDER (Left) APPLICATION OF WOUND VAC ARTHROCENTESIS ASPIRATION LARGE JOINT (N/A) Active Problems:   Cellulitis  Estimated body mass index is 24.92 kg/m as calculated from the following:   Height as of this encounter: 4\' 11"  (1.499 m).   Weight as of this encounter: 56 kg (123 lb 6.4 oz). Advance diet Up with  therapy   Labs reviewed, K+ 3.2, pharmacy has placed a note with a plan to supplement. WBC 10.7 this AM without fevers. Up with therapy today.  Continue to monitor the woundvac for active drainage. Appreciate input from infectious disease.   DVT Prophylaxis - Lovenox, Foot Pumps and TED hose Non-weight bearing to left upper extremity.  Valeria Batman, PA-C [facility] 07/02/2016, 7:30 AM

## 2016-07-02 NOTE — Progress Notes (Signed)
Pt is receiving PICC line at bedside at this time.

## 2016-07-03 LAB — PHOSPHORUS: Phosphorus: 1.9 mg/dL — ABNORMAL LOW (ref 2.5–4.6)

## 2016-07-03 LAB — BASIC METABOLIC PANEL
Anion gap: 5 (ref 5–15)
BUN: 7 mg/dL (ref 6–20)
CHLORIDE: 107 mmol/L (ref 101–111)
CO2: 26 mmol/L (ref 22–32)
Calcium: 7.8 mg/dL — ABNORMAL LOW (ref 8.9–10.3)
Creatinine, Ser: 0.32 mg/dL — ABNORMAL LOW (ref 0.44–1.00)
GFR calc Af Amer: >60 mL/min (ref >60)
GLUCOSE: 128 mg/dL — AB (ref 65–99)
POTASSIUM: 3.1 mmol/L — AB (ref 3.5–5.1)
Sodium: 138 mmol/L (ref 135–145)

## 2016-07-03 LAB — MAGNESIUM: MAGNESIUM: 1.3 mg/dL — AB (ref 1.7–2.4)

## 2016-07-03 LAB — GLUCOSE, CAPILLARY
GLUCOSE-CAPILLARY: 135 mg/dL — AB (ref 65–99)
Glucose-Capillary: 172 mg/dL — ABNORMAL HIGH (ref 65–99)

## 2016-07-03 MED ORDER — DEXTROSE 5 % IV SOLN
30.0000 mmol | Freq: Once | INTRAVENOUS | Status: DC
  Administered 2016-07-03: 30 mmol via INTRAVENOUS
  Filled 2016-07-03: qty 10

## 2016-07-03 MED ORDER — POTASSIUM CHLORIDE CRYS ER 20 MEQ PO TBCR
40.0000 meq | EXTENDED_RELEASE_TABLET | Freq: Once | ORAL | Status: AC
  Administered 2016-07-03: 40 meq via ORAL
  Filled 2016-07-03: qty 2

## 2016-07-03 MED ORDER — CEFAZOLIN SODIUM-DEXTROSE 2-4 GM/100ML-% IV SOLN: 2.0000 g | Freq: Three times a day (TID) | INTRAVENOUS | 14 refills | Status: AC

## 2016-07-03 MED ORDER — OXYCODONE HCL 5 MG PO TABS: 5.0000 mg | ORAL_TABLET | Freq: Two times a day (BID) | ORAL | 0 refills | Status: AC | PRN

## 2016-07-03 MED ORDER — VANCOMYCIN HCL IN DEXTROSE 750-5 MG/150ML-% IV SOLN
750.0000 mg | Freq: Once | INTRAVENOUS | Status: AC
  Administered 2016-07-03: 750 mg via INTRAVENOUS
  Filled 2016-07-03: qty 150

## 2016-07-03 MED ORDER — MAGNESIUM SULFATE 4 GM/100ML IV SOLN
4.0000 g | Freq: Once | INTRAVENOUS | Status: AC
  Administered 2016-07-03: 4 g via INTRAVENOUS
  Filled 2016-07-03: qty 100

## 2016-07-03 NOTE — Evaluation (Signed)
Occupational Therapy Evaluation Patient Details Name: Cheryl Mueller MRN: [id number] DOB: [birth year] Today's Date: 07/03/2016    History of Present Illness Pt. is a 76 y.o. female who was admitted to [facility] with Cellulitis and I&D of the Left Shoulder   Clinical Impression   Pt. Is a 76 y.o. Female who was admitted to [facility] with Cellulitis and I&D of the Left Shoulder. Pt. Presents with weakness, pain, impaired LUE functional use, and decreased functional mobility which hinder her ability to complete ADL functioning. Pt. Could benefit from skilled OT services for ADL training, A/E training, positioning, and functional mobility for ADLs in order to regain independence with ADLs. Pt. Plans to go to SNF upon discharge.    Follow Up Recommendations  SNF    Equipment Recommendations       Recommendations for Other Services       Precautions / Restrictions Precautions Precautions: Fall Required Braces or Orthoses: Sling Restrictions Weight Bearing Restrictions: No LLE Weight Bearing: Non weight bearing                                                     ADL Overall ADL's : Needs assistance/impaired Eating/Feeding: Set up   Grooming:  (Assist needed)           Upper Body Dressing : Total assistance   Lower Body Dressing: Total assistance                 General ADL Comments: Pt. education was provided about A/E, and positioning.     Vision     Perception     Praxis      Pertinent Vitals/Pain Pain Assessment: 0-10 Pain Score: 2  Pain Location: Back Pain Descriptors / Indicators: Aching Pain Intervention(s): Limited activity within patient's tolerance     Hand Dominance Left   Extremity/Trunk Assessment Upper Extremity Assessment Upper Extremity Assessment: LUE deficits/detail;Generalized weakness RUE Deficits / Details: RUE PICC line in place. Generalized weakness LUE Deficits / Details: LUE in sling, NWB            Communication Communication Communication: No difficulties   Cognition Arousal/Alertness: Awake/alert Behavior During Therapy: Anxious Overall Cognitive Status: Difficult to assess                     General Comments       Exercises       Shoulder Instructions      Home Living Family/patient expects to be discharged to:: Skilled nursing facility Living Arrangements: Children Available Help at Discharge: Family   Home Access: Stairs to enter Entrance Stairs-Number of Steps: 4 Entrance Stairs-Rails: Can reach both Home Layout: Able to live on main level with bedroom/bathroom;Two level     Bathroom Shower/Tub: Runner, broadcasting/film/video: Environmental consultant - 2 wheels;Walker - 4 wheels;Cane - single point;Shower seat          Prior Functioning/Environment Level of Independence: Needs assistance                 OT Problem List: Decreased strength;Pain;Impaired UE functional use;Decreased knowledge of use of DME or AE;Decreased range of motion   OT Treatment/Interventions: Self-care/ADL training    OT Goals(Current goals can be found in the care plan section) Acute Rehab OT Goals Patient Stated Goal: To  return home OT Goal Formulation: With patient Potential to Achieve Goals: Good  OT Frequency: Min 1X/week   Barriers to D/C:            Co-evaluation              End of Session    Activity Tolerance: Patient tolerated treatment well Patient left: in bed;with call bell/phone within reach;with bed alarm set   Time: [id number] OT Time Calculation (min): 25 min Charges:  OT General Charges $OT Visit: 1 Procedure OT Evaluation $OT Eval Moderate Complexity: 1 Procedure G-Codes:    Olegario MessierElaine Teira Arcilla, MS, OTR/L 07/03/2016, 10:37 AM

## 2016-07-03 NOTE — Progress Notes (Signed)
Subjective: 2 Days Post-Op Procedure(s) (LRB): IRRIGATION AND DEBRIDEMENT SHOULDER (Left) APPLICATION OF WOUND VAC ARTHROCENTESIS ASPIRATION LARGE JOINT (N/A) Patient reports pain as mild.  Pt is drowsy but arousable this AM. Patient is well, and has had no acute complaints or problems Plan will be for discharge to SNF when medically appropriate. Negative for chest pain and shortness of breath Fever: No fever this AM. Gastrointestinal:Negative for nausea and vomiting  Objective: Vital signs in last 24 hours: Temp:  [97.9 F (36.6 C)-98.2 F (36.8 C)] 98.2 F (36.8 C) (01/10 0557) Pulse Rate:  [82-93] 84 (01/10 0557) Resp:  [18-19] 19 (01/10 0557) BP: (141-176)/(59-79) 170/59 (01/10 0557) SpO2:  [98 %] 98 % (01/10 0557)  Intake/Output from previous day:  Intake/Output Summary (Last 24 hours) at 07/03/16 0753 Last data filed at 07/03/16 0631  Gross per 24 hour  Intake          3108.75 ml  Output                0 ml  Net          3108.75 ml    Intake/Output this shift: No intake/output data recorded.  Labs:  Recent Labs  07/01/16 0351 07/02/16 0452  HGB 9.1* 8.4*    Recent Labs  07/01/16 0351 07/02/16 0452  WBC 10.3 10.7  RBC 3.13* 2.90*  HCT 27.0* 25.1*  PLT 384 422    Recent Labs  07/02/16 0452 07/02/16 1820 07/03/16 0500  NA 138  --  138  K 3.2* 2.9* 3.1*  CL 107  --  107  CO2 24  --  26  BUN 11  --  7  CREATININE 0.51  --  0.32*  GLUCOSE 186*  --  128*  CALCIUM 7.5*  --  7.8*   No results for input(s): LABPT, INR in the last 72 hours.   EXAM General - Patient is Alert, Appropriate and Oriented Extremity - ABD soft Sensation intact distally Intact pulses distally  Woundvac in place, no active drainage. Erythema appears to be improved compared to at the time of surgery, less pain with palpation of the area, no erythema present over the anterior shoulder incision.  Skin is softer to palpation this AM. Dressing/Incision - Woundvac in  place. Motor Function - intact, moving foot and toes well on exam. Intact to light touch over the left deltoid.  Past Medical History:  Diagnosis Date  . Anxiety   . Arthritis   . Cervical myelopathy (HCC)   . Diabetes mellitus without complication (HCC)    Non Insulin dependant  . Headache    h/o migraines  . Hyperlipemia   . Hypertension   . Restless leg syndrome   . Stroke [facility](HCC) 2002   mini stroke  . TIA (transient ischemic attack)    approx 15 years ago  . Vertigo    hx of  . White matter disease     Assessment/Plan: 2 Days Post-Op Procedure(s) (LRB): IRRIGATION AND DEBRIDEMENT SHOULDER (Left) APPLICATION OF WOUND VAC ARTHROCENTESIS ASPIRATION LARGE JOINT (N/A) Active Problems:   Cellulitis  Estimated body mass index is 24.92 kg/m as calculated from the following:   Height as of this encounter: 4\' 11"  (1.499 m).   Weight as of this encounter: 56 kg (123 lb 6.4 oz). Advance diet Up with therapy   Labs reviewed, K+ 3.1, pharmacy to continue to monitor. CBC results not returned this AM, will re-check later today. Up with therapy today.  Continue to monitor the  woundvac for active drainage. Appreciate input from infectious disease, recommending PICC Line.  Upon discharge from hospital, should follow-up with [facility] either next Thursday or Friday (1/18 or 1/19) with either Dr. Joice Lofts or Horris Latino, PA-C.  DVT Prophylaxis - Lovenox, Foot Pumps and TED hose Non-weight bearing to left upper extremity.  Valeria Batman, PA-C [facility] Orthopaedic Surgery 07/03/2016, 7:53 AM

## 2016-07-03 NOTE — Progress Notes (Signed)
Pharmacy consulted for electrolyte management for 76 yo female admitted with cellulitis and experiencing diarrhea.   Will replace magnesium 4g IV x 1 and potassium phosphorus 30mmol IV x 1. Will recheck electrolytes with labs at 1800.    Pharmacy will continue to monitor and adjust per consult.    MLS

## 2016-07-03 NOTE — Discharge Instructions (Signed)
Upon discharge from hospital, should follow-up with [facility] either next Thursday or Friday (1/18 or 1/19) with either Dr. Joice LoftsPoggi or Horris LatinoLance McGhee, PA-C.  Bone and Joint Infections, Adult Introduction Bone infections (osteomyelitis) and joint infections (septic arthritis) occur when bacteria or other germs get inside a bone or a joint. This can happen if you have an infection in another part of your body that spreads through your blood. Germs from your skin or from outside of your body can also cause this type of infection if you have a wound or a broken bone (fracture) that breaks the skin. Anyone can get a bone infection or joint infection. You may be more likely to get this type of infection if you have a condition, such as diabetes, that lowers your ability to fight infection or increases your chances of getting an infection. Bone and joint infections can cause damage, and they can spread to other areas of your body. They need to be treated quickly. What are the causes? Most bone and joint infections are caused by bacteria. They can also be caused by other germs, such as viruses and funguses. What increases the risk? This condition is more likely to develop in:  People who recently had surgery, especially bone or joint surgery.  People who have a long-term (chronic) disease, such as:  HIV (human immunodeficiency virus).  Diabetes.  Rheumatoid arthritis.  Sickle cell anemia.  Elderly people.  People who take medicines that block or weaken the bodys defense system (immune system).  People who have a condition that reduces their blood flow.  People who are on kidney dialysis.  People who have an artificial joint.  People who have had a joint or bone repaired with plates or screws (surgical hardware).  People who use or abuse IV drugs.  People who have had trauma, such as stepping on a nail. What are the signs or symptoms? Symptoms vary depending on the type and location  of your infection. Common symptoms of bone and joint infections include:  Fever and chills.  Redness and warmth.  Swelling.  Pain and stiffness.  Drainage of fluid or pus near the infection.  Weight loss and fatigue.  Decreased ability to use a hand or foot. How is this diagnosed? This condition may be diagnosed based on symptoms, medical history, a physical exam, and diagnostic tests. Tests can help to identify the cause of the infection. You may have various tests, such as:  A sample of tissue, fluid, or blood taken to be examined under a microscope.  A procedure to remove fluid from the infected joint with a needle (joint aspiration) for testing in a lab.  Pus or discharge swabbed from a wound for testing to identify germs and to determine what type of medicine will kill them (culture and sensitivity).  Blood tests to look for evidence of infection and inflammation (biomarkers).  Imaging studies to determine how severe the bone or joint infection is. These may include:  X-rays.  CT scan.  MRI.  Bone scan. How is this treated? Treatment depends on the cause and type of infection. Antibiotic medicines are usually the first treatment for a bone or joint infection. Treatment with antibiotics may include:  Getting IV antibiotics. This may be done in a hospital at first. You may have to continue IV antibiotics at home for several weeks. You may also have to take antibiotics by mouth for several weeks after that.  Taking more than one kind of antibiotic. Treatment may  start with a type of antibiotic that works against many different bacteria (broad spectrumantibiotics). IV antibiotics may be changed if tests show that another type may work better. Other treatments may include:  Draining fluid from the joint by placing a needle into it (aspiration).  Surgery to remove:  Dead or dying tissue from a bone or joint.  An infected artificial joint.  Infected plates or screws  that were used to repair a broken bone. Follow these instructions at home:  Take medicines only as directed by your health care provider.  Take your antibiotic medicine as directed by your health care provider. Finish the antibiotic even if you start to feel better.  Follow instructions from your health care provider about how to take IV antibiotics at home.  Ask your health care provider if you have any restrictions on your activities.  Keep all follow-up visits as directed by your health care provider. This is important. Contact a health care provider if:  You have a fever or chills.  You have redness, warmth, pain, or swelling that returns after treatment. Get help right away if:  You have rapid breathing or you have trouble breathing.  You have chest pain.  You cannot drink fluids or make urine.  The affected arm or leg swells, changes color, or turns blue. This information is not intended to replace advice given to you by your health care provider. Make sure you discuss any questions you have with your health care provider. Document Released: 06/10/2005 Document Revised: 11/16/2015 Document Reviewed: 06/08/2014  2017 [employer]

## 2016-07-03 NOTE — Progress Notes (Signed)
Patient is medically stable for D/C to [facility] today. Per Jomarie LongsJoseph [facility] liaison patient can go to room 710. RN will call report to RN Konrad DoloresKim Hicks at [phone] and arrange EMS for transport. Clinical Child psychotherapistocial Worker (CSW) sent D/C orders to [facility] via [facility] today. Patient is aware of above. CSW contacted patient's husband Charlesetta GaribaldiRichard Schauer and made him aware of above. Please reconsult if future social work needs arise. CSW signing off.   Baker Hughes IncorporatedBailey Anacristina Steffek, LCSW [phone]

## 2016-07-03 NOTE — Discharge Summary (Addendum)
[facility] [place] NAME: Cheryl Mueller    MR#:  [id number]  DATE OF BIRTH:  [birth year]  DATE OF ADMISSION:  06/26/2016   ADMITTING PHYSICIAN: Vaughan Basta, MD  DATE OF DISCHARGE: 07/03/2016  PRIMARY CARE PHYSICIAN: Tama High III, MD   ADMISSION DIAGNOSIS:  Cellulitis of lower back [L03.312] Left arm cellulitis [L03.114] Sepsis, due to unspecified organism ([place]) [A41.9] DISCHARGE DIAGNOSIS:  Active Problems:   Cellulitis Escherichia coli sepsis due to E.coli UTI and lt shoulder abscess s/p Irrigation and debridement of posterolateral left shoulder abscess with application of wound VAC SECONDARY DIAGNOSIS:   Past Medical History:  Diagnosis Date  . Anxiety   . Arthritis   . Cervical myelopathy ([place])   . Diabetes mellitus without complication (HCC)    Non Insulin dependant  . Headache    h/o migraines  . Hyperlipemia   . Hypertension   . Restless leg syndrome   . Stroke [facility]) 2002   mini stroke  . TIA (transient ischemic attack)    approx 15 years ago  . Vertigo    hx of  . White matter disease    HOSPITAL COURSE:  76 y.o. female with a known history of Anxiety, cervical myelopathy, diabetes, migraine headache, hypertension, restless leg syndrome, stroke, TIA- have gait disturbance and bladder incontinence for last few months and uses a walker or wheelchair. Was seen by [facility] neurology, MRI on the brain was done, awaiting further diagnoses but was told some kind of "white matter disease". Was admitted for  1. Escherichia coli bacteremia/sepsis: Present on admission likely due to E.coli UTI 2. Lt shoulder abscess with Staph Aureus: s/pIrrigation and debridement of posterolateral left shoulder abscess with application of wound VAC. Aspiration of left glenohumeral joint on 1/8  - Appreciate ID input - continue IV Ancef for 2-4 weeks - will need 2 weeks outpt f/up with ID  2. Hypokalemia/Hypomagnesemia:  repleted 3. Escherichia coli UTI: Continue IV Ancef 4. Diabetes mellitus :  stable 5. Weakness. Physical therapy recommends rehabilitation, [facility] 6. Essential hypertension on Norvasc 7. Hyperlipidemia unspecified on atorvastatin 8. Depression on Effexor  DISCHARGE CONDITIONS:  STABLE CONSULTS OBTAINED:  Treatment Team:  Leonel Ramsay, MD Corky Mull, MD DRUG ALLERGIES:   Allergies  Allergen Reactions  . Alendronate Other (See Comments)    Reaction:  Leg pain   . Atorvastatin Rash  . Formaldehyde Rash   DISCHARGE MEDICATIONS:   Allergies as of 07/03/2016      Reactions   Alendronate Other (See Comments)   Reaction:  Leg pain    Atorvastatin Rash   Formaldehyde Rash      Medication List    STOP taking these medications   HYDROmorphone 2 MG tablet Commonly known as:  DILAUDID   lidocaine 5 % Commonly known as:  LIDODERM   oxyCODONE-acetaminophen 5-325 MG tablet Commonly known as:  ROXICET     TAKE these medications   amLODipine 10 MG tablet Commonly known as:  NORVASC Take 10 mg by mouth daily.   aspirin-acetaminophen-caffeine 250-250-65 MG tablet Commonly known as:  EXCEDRIN MIGRAINE Take 2 tablets by mouth every 8 (eight) hours as needed for headache.   atorvastatin 80 MG tablet Commonly known as:  LIPITOR Take 80 mg by mouth at bedtime.   ceFAZolin 2-4 GM/100ML-% IVPB Commonly known as:  ANCEF Inject 100 mLs (2 g total) into the vein every 8 (  eight) hours.   diphenhydrAMINE 25 mg capsule Commonly known as:  BENADRYL Take 25 mg by mouth every 6 (six) hours as needed for itching.   glimepiride 4 MG tablet Commonly known as:  AMARYL Take 1 tablet (4 mg total) by mouth daily with breakfast.   hydrochlorothiazide 12.5 MG capsule Commonly known as:  MICROZIDE Take 12.5 mg by mouth daily.   hydrOXYzine 25 MG tablet Commonly known as:  ATARAX/VISTARIL Take 25 mg by mouth 3 (three) times daily as  needed.   IBUPROFEN PM 200-38 MG Tabs Generic drug:  Ibuprofen-Diphenhydramine Cit Take 2 tablets by mouth at bedtime as needed (for sleep).   lisinopril 40 MG tablet Commonly known as:  PRINIVIL,ZESTRIL Take 40 mg by mouth daily.   metFORMIN 1000 MG tablet Commonly known as:  GLUCOPHAGE Take 1,000 mg by mouth 2 (two) times daily with a meal.   mometasone 0.1 % cream Commonly known as:  ELOCON Apply 1 application topically daily as needed (for itching).   oxyCODONE 5 MG immediate release tablet Commonly known as:  Oxy IR/ROXICODONE Take 1 tablet (5 mg total) by mouth 2 (two) times daily as needed for breakthrough pain. What changed:  how much to take  when to take this   potassium chloride 10 MEQ tablet Commonly known as:  K-DUR,KLOR-CON Take 1 tablet by mouth daily.   rOPINIRole 0.25 MG tablet Commonly known as:  REQUIP Take 1 tablet (0.25 mg total) by mouth at bedtime.   sitaGLIPtin 100 MG tablet Commonly known as:  JANUVIA Take 1 tablet by mouth daily.   traZODone 50 MG tablet Commonly known as:  DESYREL Take 0.5 tablets (25 mg total) by mouth at bedtime.   venlafaxine XR 75 MG 24 hr capsule Commonly known as:  EFFEXOR-XR Take 1 capsule by mouth daily.      DISCHARGE INSTRUCTIONS:  PICC Care per protocol Labs weekly while on IV antibiotics      CBC w diff         Comprehensive met panel CRP       Planned duration of antibiotics 2-4 weeks from surgery 1/8  Stop date        07/17/15                                                Follow up Dr Ola Spurr date within 2 weeks  FAX weekly labs to [phone]  Upon discharge from hospital, should follow-up with [facility] either next Thursday or Friday (1/18 or 1/19) with either Dr. Roland Rack or Cameron Proud, PA-C.  Non-weight bearing to left upper extremity. DIET:  Regular diet DISCHARGE CONDITION:  Good ACTIVITY:  Activity as tolerated OXYGEN:  Home Oxygen: No.  Oxygen Delivery: room  air DISCHARGE LOCATION:  nursing home   If you experience worsening of your admission symptoms, develop shortness of breath, life threatening emergency, suicidal or homicidal thoughts you must seek medical attention immediately by calling 911 or calling your MD immediately  if symptoms less severe.  You Must read complete instructions/literature along with all the possible adverse reactions/side effects for all the Medicines you take and that have been prescribed to you. Take any new Medicines after you have completely understood and accpet all the possible adverse reactions/side effects.   Please note  You were cared for by a hospitalist during your hospital stay. If you  have any questions about your discharge medications or the care you received while you were in the hospital after you are discharged, you can call the unit and asked to speak with the hospitalist on call if the hospitalist that took care of you is not available. Once you are discharged, your primary care physician will handle any further medical issues. Please note that NO REFILLS for any discharge medications will be authorized once you are discharged, as it is imperative that you return to your primary care physician (or establish a relationship with a primary care physician if you do not have one) for your aftercare needs so that they can reassess your need for medications and monitor your lab values.    On the day of Discharge:  VITAL SIGNS:  Blood pressure (!) 154/68, pulse 75, temperature (!) 96.2 F (35.7 C), temperature source Axillary, resp. rate 19, height 4' 11"  (1.499 m), weight 56 kg (123 lb 6.4 oz), SpO2 97 %. PHYSICAL EXAMINATION:  GENERAL:  76 y.o.-year-old patient lying in the bed with no acute distress.  EYES: Pupils equal, round, reactive to light and accommodation. No scleral icterus. Extraocular muscles intact.  HEENT: Head atraumatic, normocephalic. Oropharynx and nasopharynx clear.  NECK:  Supple, no  jugular venous distention. No thyroid enlargement, no tenderness.  LUNGS: Normal breath sounds bilaterally, no wheezing, rales,rhonchi or crepitation. No use of accessory muscles of respiration.  CARDIOVASCULAR: S1, S2 normal. No murmurs, rubs, or gallops.  ABDOMEN: Soft, non-tender, non-distended. Bowel sounds present. No organomegaly or mass.  EXTREMITIES: No pedal edema, cyanosis, or clubbing.  NEUROLOGIC: Cranial nerves II through XII are intact. Muscle strength 5/5 in all extremities. Sensation intact. Gait not checked.  PSYCHIATRIC: The patient is alert and oriented x 3.  SKIN: No obvious rash, lesion, or ulcer.  DATA REVIEW:   CBC  Recent Labs Lab 07/02/16 0452  WBC 10.7  HGB 8.4*  HCT 25.1*  PLT 422    Chemistries   Recent Labs Lab 06/26/16 1439  07/03/16 0500  NA 131*  < > 138  K 3.3*  < > 3.1*  CL 93*  < > 107  CO2 23  < > 26  GLUCOSE 242*  < > 128*  BUN 29*  < > 7  CREATININE 1.20*  < > 0.32*  CALCIUM 9.9  < > 7.8*  MG  --   < > 1.3*  AST 56*  --   --   ALT 26  --   --   ALKPHOS 132*  --   --   BILITOT <0.1*  --   --   < > = values in this interval not displayed.   Microbiology Results  Results for orders placed or performed during the hospital encounter of 06/26/16  Blood culture (routine x 2)     Status: Abnormal   Collection Time: 06/26/16  3:48 PM  Result Value Ref Range Status   Specimen Description BLOOD RIGHT AC  Final   Special Requests   Final    BOTTLES DRAWN AEROBIC AND ANAEROBIC AER 13ML ANA 10ML   Culture  Setup Time   Final    GRAM NEGATIVE RODS AEROBIC BOTTLE ONLY CRITICAL RESULT CALLED TO, READ BACK BY AND VERIFIED WITH: MELLISSA MACCIA ON 06/30/16 AT 1103 [facility] Performed at [facility] (A)  Final   Report Status 07/02/2016 FINAL  Final   Organism ID, Bacteria ESCHERICHIA COLI  Final      Susceptibility  Escherichia coli - MIC*    AMPICILLIN 4 SENSITIVE Sensitive     CEFAZOLIN <=4 SENSITIVE  Sensitive     CEFEPIME <=1 SENSITIVE Sensitive     CEFTAZIDIME <=1 SENSITIVE Sensitive     CEFTRIAXONE <=1 SENSITIVE Sensitive     CIPROFLOXACIN <=0.25 SENSITIVE Sensitive     GENTAMICIN <=1 SENSITIVE Sensitive     IMIPENEM <=0.25 SENSITIVE Sensitive     TRIMETH/SULFA <=20 SENSITIVE Sensitive     AMPICILLIN/SULBACTAM <=2 SENSITIVE Sensitive     PIP/TAZO <=4 SENSITIVE Sensitive     Extended ESBL NEGATIVE Sensitive     * ESCHERICHIA COLI  Blood Culture ID Panel (Reflexed)     Status: Abnormal   Collection Time: 06/26/16  3:48 PM  Result Value Ref Range Status   Enterococcus species NOT DETECTED NOT DETECTED Final   Listeria monocytogenes NOT DETECTED NOT DETECTED Final   Staphylococcus species NOT DETECTED NOT DETECTED Final   Staphylococcus aureus NOT DETECTED NOT DETECTED Final   Streptococcus species NOT DETECTED NOT DETECTED Final   Streptococcus agalactiae NOT DETECTED NOT DETECTED Final   Streptococcus pneumoniae NOT DETECTED NOT DETECTED Final   Streptococcus pyogenes NOT DETECTED NOT DETECTED Final   Acinetobacter baumannii NOT DETECTED NOT DETECTED Final   Enterobacteriaceae species DETECTED (A) NOT DETECTED Final    Comment: CRITICAL RESULT CALLED TO, READ BACK BY AND VERIFIED WITH: MELLISSA MACCIA ON 06/30/16 AT 1103 [place]    Enterobacter cloacae complex NOT DETECTED NOT DETECTED Final   Escherichia coli DETECTED (A) NOT DETECTED Final    Comment: CRITICAL RESULT CALLED TO, READ BACK BY AND VERIFIED WITH: MELLISSA MACCIA ON 06/30/16 AT 1103 [place]    Klebsiella oxytoca NOT DETECTED NOT DETECTED Final   Klebsiella pneumoniae NOT DETECTED NOT DETECTED Final   Proteus species NOT DETECTED NOT DETECTED Final   Serratia marcescens NOT DETECTED NOT DETECTED Final   Carbapenem resistance NOT DETECTED NOT DETECTED Final   Haemophilus influenzae NOT DETECTED NOT DETECTED Final   Neisseria meningitidis NOT DETECTED NOT DETECTED Final   Pseudomonas aeruginosa NOT DETECTED NOT DETECTED  Final   Candida albicans NOT DETECTED NOT DETECTED Final   Candida glabrata NOT DETECTED NOT DETECTED Final   Candida krusei NOT DETECTED NOT DETECTED Final   Candida parapsilosis NOT DETECTED NOT DETECTED Final   Candida tropicalis NOT DETECTED NOT DETECTED Final  Blood culture (routine x 2)     Status: None   Collection Time: 06/26/16  4:27 PM  Result Value Ref Range Status   Specimen Description BLOOD LEFT FOREARM  Final   Special Requests   Final    BOTTLES DRAWN AEROBIC AND ANAEROBIC AER 11ML ANA 1ML   Culture NO GROWTH 5 DAYS  Final   Report Status 07/01/2016 FINAL  Final  Urine culture     Status: Abnormal   Collection Time: 06/26/16  6:01 PM  Result Value Ref Range Status   Specimen Description URINE, CATHETERIZED  Final   Special Requests NONE  Final   Culture >=100,000 COLONIES/mL ESCHERICHIA COLI (A)  Final   Report Status 06/30/2016 FINAL  Final   Organism ID, Bacteria ESCHERICHIA COLI (A)  Final      Susceptibility   Escherichia coli - MIC*    AMPICILLIN <=2 SENSITIVE Sensitive     CEFAZOLIN <=4 SENSITIVE Sensitive     CEFTRIAXONE <=1 SENSITIVE Sensitive     CIPROFLOXACIN <=0.25 SENSITIVE Sensitive     GENTAMICIN <=1 SENSITIVE Sensitive     IMIPENEM <=0.25 SENSITIVE Sensitive  NITROFURANTOIN <=16 SENSITIVE Sensitive     TRIMETH/SULFA <=20 SENSITIVE Sensitive     AMPICILLIN/SULBACTAM <=2 SENSITIVE Sensitive     PIP/TAZO <=4 SENSITIVE Sensitive     Extended ESBL NEGATIVE Sensitive     * >=100,000 COLONIES/mL ESCHERICHIA COLI  MRSA PCR Screening     Status: None   Collection Time: 06/26/16  9:39 PM  Result Value Ref Range Status   MRSA by PCR NEGATIVE NEGATIVE Final    Comment:        The GeneXpert MRSA Assay (FDA approved for NASAL specimens only), is one component of a comprehensive MRSA colonization surveillance program. It is not intended to diagnose MRSA infection nor to guide or monitor treatment for MRSA infections.   C difficile quick scan w PCR  reflex     Status: Abnormal   Collection Time: 06/29/16 11:20 AM  Result Value Ref Range Status   C Diff antigen POSITIVE (A) NEGATIVE Final   C Diff toxin NEGATIVE NEGATIVE Final   C Diff interpretation Results are indeterminate. See PCR results.  Final  Clostridium Difficile by PCR     Status: None   Collection Time: 06/29/16 11:20 AM  Result Value Ref Range Status   Toxigenic C Difficile by pcr NEGATIVE NEGATIVE Final    Comment: Patient is colonized with non toxigenic C. difficile. May not need treatment unless significant symptoms are present.  Aerobic/Anaerobic Culture (surgical/deep wound)     Status: None (Preliminary result)   Collection Time: 07/01/16  1:00 PM  Result Value Ref Range Status   Specimen Description ABSCESS LEFT SHOULDER  Final   Special Requests NONE  Final   Gram Stain   Final    RARE WBC PRESENT,BOTH PMN AND MONONUCLEAR RARE GRAM POSITIVE COCCI IN PAIRS IN SINGLES    Culture   Final    FEW STAPHYLOCOCCUS AUREUS SUSCEPTIBILITIES TO FOLLOW Performed at [facility]    Report Status PENDING  Incomplete     Contact information for follow-up providers    BERT Briscoe Burns III, MD. Schedule an appointment as soon as possible for a visit in 1 week(s).   Specialty:  Internal Medicine Contact information: [place] [address] Deuel Poggi, MD. Schedule an appointment as soon as possible for a visit on 07/11/2016.   Specialty:  Surgery Contact information: [place] [address] [phone]        Leonel Ramsay, MD. Schedule an appointment as soon as possible for a visit in 2 week(s).   Specialty:  Infectious Diseases Contact information: [place] [address] [phone]            Contact information for after-discharge care    Destination    [facility] Follow up.   Specialty:  [place] information: [address] [place] [phone]                  Management plans discussed with the patient, family and they are in agreement.  CODE STATUS: FULL CODE   TOTAL TIME TAKING CARE OF THIS PATIENT: 45 minutes.    Max Sane M.D on 07/03/2016 at 8:58 AM  Between 7am to 6pm - Pager - [phone]  After 6pm go to [email or web address] - [facility]  Office  [phone]  CC: Primary care physician; Adin Hector, MD   Note: This dictation was prepared with Dragon dictation along with smaller phrase technology. Any transcriptional errors that result from this process are unintentional.

## 2016-07-03 NOTE — Progress Notes (Signed)
Pharmacy Antibiotic Note  Cheryl Mueller is a 76 y.o. female admitted on 06/26/2016 with E.Coli bacteremia and E.Coli UTI.  Pharmacy has been consulted for Cefazolin  Dosing.   Plan: Will start the patient on Cefazolin 2gm IV every 8 hours.   Height: 4\' 11"  (149.9 cm) Weight: 123 lb 6.4 oz (56 kg) IBW/kg (Calculated) : 43.2  Temp (24hrs), Avg:97.6 F (36.4 C), Min:96.2 F (35.7 C), Max:98.2 F (36.8 C)   Recent Labs Lab 06/26/16 1548 06/26/16 1858 06/27/16 0440 06/28/16 0420 06/29/16 0308 06/30/16 1352 07/01/16 0351 07/02/16 0452 07/02/16 1820 07/03/16 0500  WBC  --   --  21.0* 17.3* 14.0*  --  10.3 10.7  --   --   CREATININE  --   --  0.73 0.86  --  0.49 0.56 0.51  --  0.32*  LATICACIDVEN 3.5* 2.2*  --   --   --   --   --   --   --   --   VANCOTROUGH  --   --   --   --   --   --   --   --  <4*  --     Estimated Creatinine Clearance: 44.9 mL/min (by C-G formula based on SCr of 0.32 mg/dL (L)).    Allergies  Allergen Reactions  . Alendronate Other (See Comments)    Reaction:  Leg pain   . Atorvastatin Rash  . Formaldehyde Rash    Antimicrobials this admission: 1/5 Vancomycin  >> 1/10 1/3 Zosyn  >> 1/9 1/9 Cefazolin >>  Dose adjustments this admission:   Microbiology results: 1/3 BCx: E.Coli,  1/3 UCx: E.Coli 1/8 Aerobic/Anaerobic Cx: Staph aureus  1/3 MRSA PCR: negative   Pharmacy will continue to monitor and adjust per consult   Arieana Somoza L 07/03/2016 9:55 AM

## 2016-07-03 NOTE — Clinical Social Work Placement (Signed)
   CLINICAL SOCIAL WORK PLACEMENT  NOTE  Date:  07/03/2016  Patient Details  Name: Cheryl Mueller MRN: [id number] Date of Birth: 1941/06/19  Clinical Social Work is seeking post-discharge placement for this patient at the Skilled  Nursing Facility level of care (*CSW will initial, date and re-position this form in  chart as items are completed):  Yes   Patient/family provided with [facility] Clinical Social Work Department's list of facilities offering this level of care within the geographic area requested by the patient (or if unable, by the patient's family).  Yes   Patient/family informed of their freedom to choose among providers that offer the needed level of care, that participate in Medicare, Medicaid or managed care program needed by the patient, have an available bed and are willing to accept the patient.  Yes   Patient/family informed of [facility] ownership interest [facility] and [facility], as well as of the fact that they are under no obligation to receive care at these facilities.  PASRR submitted to EDS on       PASRR number received on       Existing PASRR number confirmed on 06/27/16     FL2 transmitted to all facilities in geographic area requested by pt/family on 06/27/16     FL2 transmitted to all facilities within larger geographic area on       Patient informed that his/her managed care company has contracts with or will negotiate with certain facilities, including the following:        Yes   Patient/family informed of bed offers received.  Patient chooses bed at  ([facility] )     Physician recommends and patient chooses bed at      Patient to be transferred to  ([facility] ) on 07/03/16.  Patient to be transferred to facility by  [facility]     Patient family notified on 07/03/16 of transfer.  Name of family member notified:   (Patient's husband Charlesetta GaribaldiRichard Melgarejo is aware of D/C today. )     PHYSICIAN       Additional Comment:     _______________________________________________ Adrielle Polakowski, Darleen CrockerBailey M, LCSW 07/03/2016, 10:46 AM

## 2016-07-06 LAB — AEROBIC/ANAEROBIC CULTURE W GRAM STAIN (SURGICAL/DEEP WOUND)

## 2016-07-06 LAB — AEROBIC/ANAEROBIC CULTURE (SURGICAL/DEEP WOUND)

## 2016-07-08 ENCOUNTER — Other Ambulatory Visit
Admission: RE | Admit: 2016-07-08 | Discharge: 2016-07-08 | Disposition: A | Discharge status: Home / Self Care | Payer: Medicare Other | Source: Ambulatory Visit | Attending: Family Medicine | Admitting: Family Medicine

## 2016-07-08 DIAGNOSIS — Z5181 Encounter for therapeutic drug level monitoring: Secondary | ICD-10-CM | POA: Diagnosis present

## 2016-07-08 LAB — CREATININE, SERUM
Creatinine, Ser: 0.39 mg/dL — ABNORMAL LOW (ref 0.44–1.00)
GFR calc Af Amer: >60 mL/min (ref >60)
GFR calc non Af Amer: >60 mL/min (ref >60)

## 2016-07-08 LAB — VANCOMYCIN, TROUGH: VANCOMYCIN TR: 4 ug/mL — AB (ref 15–20)

## 2016-07-11 ENCOUNTER — Other Ambulatory Visit
Admission: RE | Admit: 2016-07-11 | Discharge: 2016-07-11 | Disposition: A | Discharge status: Home / Self Care | Payer: Medicare Other | Source: Ambulatory Visit | Attending: Family Medicine | Admitting: Family Medicine

## 2016-07-11 DIAGNOSIS — Z5189 Encounter for other specified aftercare: Secondary | ICD-10-CM | POA: Insufficient documentation

## 2016-07-11 DIAGNOSIS — F3289 Other specified depressive episodes: Secondary | ICD-10-CM | POA: Insufficient documentation

## 2016-07-11 DIAGNOSIS — R12 Heartburn: Secondary | ICD-10-CM | POA: Diagnosis present

## 2016-07-11 DIAGNOSIS — E876 Hypokalemia: Secondary | ICD-10-CM | POA: Diagnosis present

## 2016-07-11 DIAGNOSIS — G4700 Insomnia, unspecified: Secondary | ICD-10-CM | POA: Diagnosis present

## 2016-07-11 DIAGNOSIS — L03114 Cellulitis of left upper limb: Secondary | ICD-10-CM | POA: Insufficient documentation

## 2016-07-11 DIAGNOSIS — M6281 Muscle weakness (generalized): Secondary | ICD-10-CM | POA: Insufficient documentation

## 2016-07-11 DIAGNOSIS — R51 Headache: Secondary | ICD-10-CM | POA: Diagnosis present

## 2016-07-11 DIAGNOSIS — R52 Pain, unspecified: Secondary | ICD-10-CM | POA: Diagnosis present

## 2016-07-11 DIAGNOSIS — D509 Iron deficiency anemia, unspecified: Secondary | ICD-10-CM | POA: Insufficient documentation

## 2016-07-11 DIAGNOSIS — N390 Urinary tract infection, site not specified: Secondary | ICD-10-CM | POA: Diagnosis present

## 2016-07-11 LAB — CREATININE, SERUM
Creatinine, Ser: 0.51 mg/dL (ref 0.44–1.00)
GFR calc non Af Amer: >60 mL/min (ref >60)

## 2016-07-11 LAB — VANCOMYCIN, TROUGH: Vancomycin Tr: 12 ug/mL — ABNORMAL LOW (ref 15–20)

## 2016-07-15 ENCOUNTER — Other Ambulatory Visit
Admission: RE | Admit: 2016-07-15 | Discharge: 2016-07-15 | Disposition: A | Discharge status: Home / Self Care | Payer: Medicare Other | Source: Ambulatory Visit | Attending: Family Medicine | Admitting: Family Medicine

## 2016-07-15 DIAGNOSIS — M6281 Muscle weakness (generalized): Secondary | ICD-10-CM | POA: Insufficient documentation

## 2016-07-15 DIAGNOSIS — R12 Heartburn: Secondary | ICD-10-CM | POA: Insufficient documentation

## 2016-07-15 DIAGNOSIS — R52 Pain, unspecified: Secondary | ICD-10-CM | POA: Insufficient documentation

## 2016-07-15 DIAGNOSIS — N390 Urinary tract infection, site not specified: Secondary | ICD-10-CM | POA: Insufficient documentation

## 2016-07-15 LAB — BUN: BUN: 13 mg/dL (ref 6–20)

## 2016-07-15 LAB — CREATININE, SERUM
Creatinine, Ser: 0.48 mg/dL (ref 0.44–1.00)
GFR calc Af Amer: >60 mL/min (ref >60)
GFR calc non Af Amer: >60 mL/min (ref >60)

## 2016-07-15 LAB — VANCOMYCIN, TROUGH: VANCOMYCIN TR: 19 ug/mL (ref 15–20)

## 2016-11-22 IMAGING — DX DG ELBOW COMPLETE 3+V*L*
4 series · 4 of 4 positions shown · non-contrast
Comparison: None.

CLINICAL DATA: Fell at the gross restored today. The olecranon
region pain.

EXAM:
LEFT ELBOW - COMPLETE 3+ VIEW

[elbow ap]
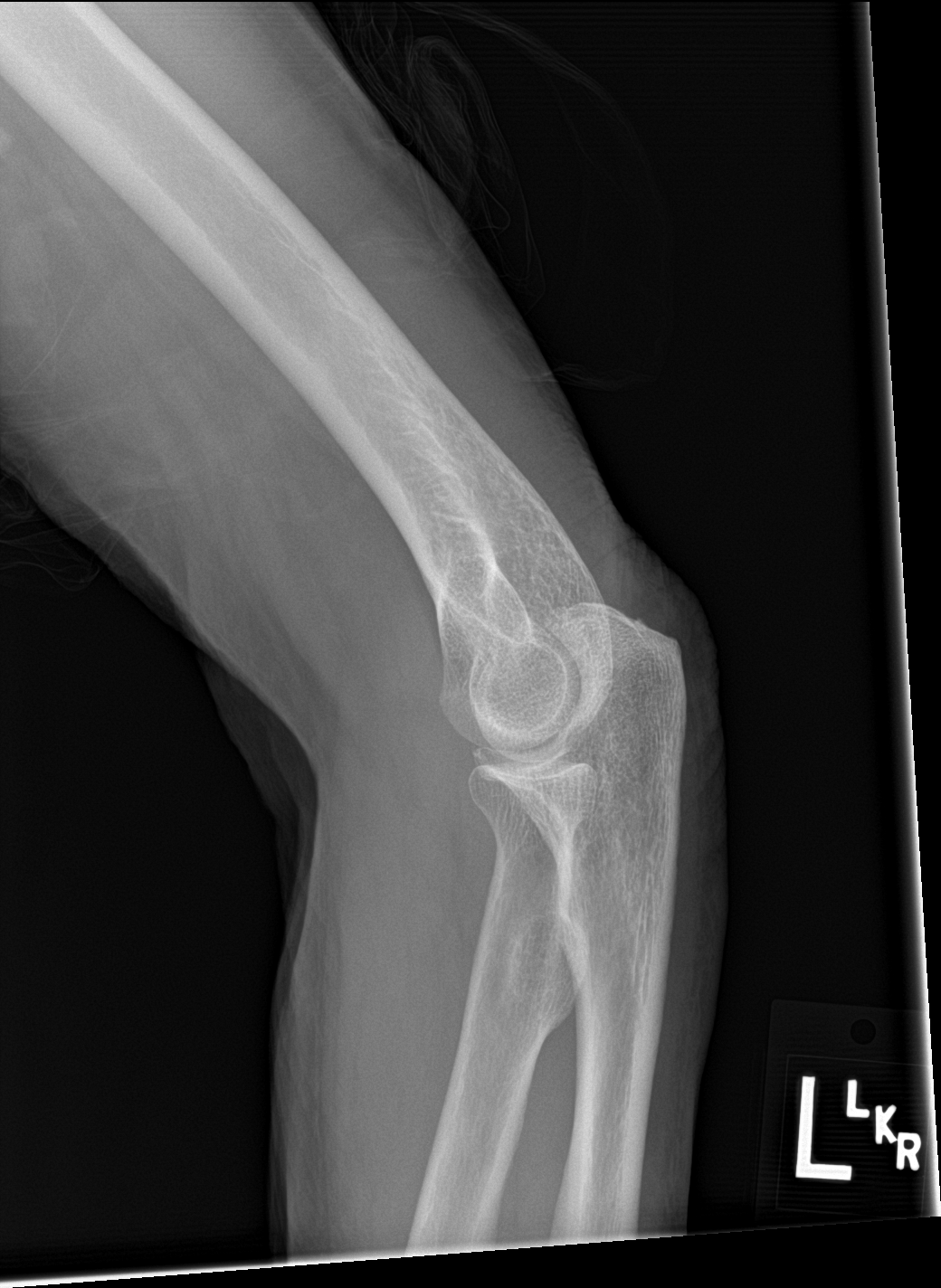

[elbow obl (1 of 2)]
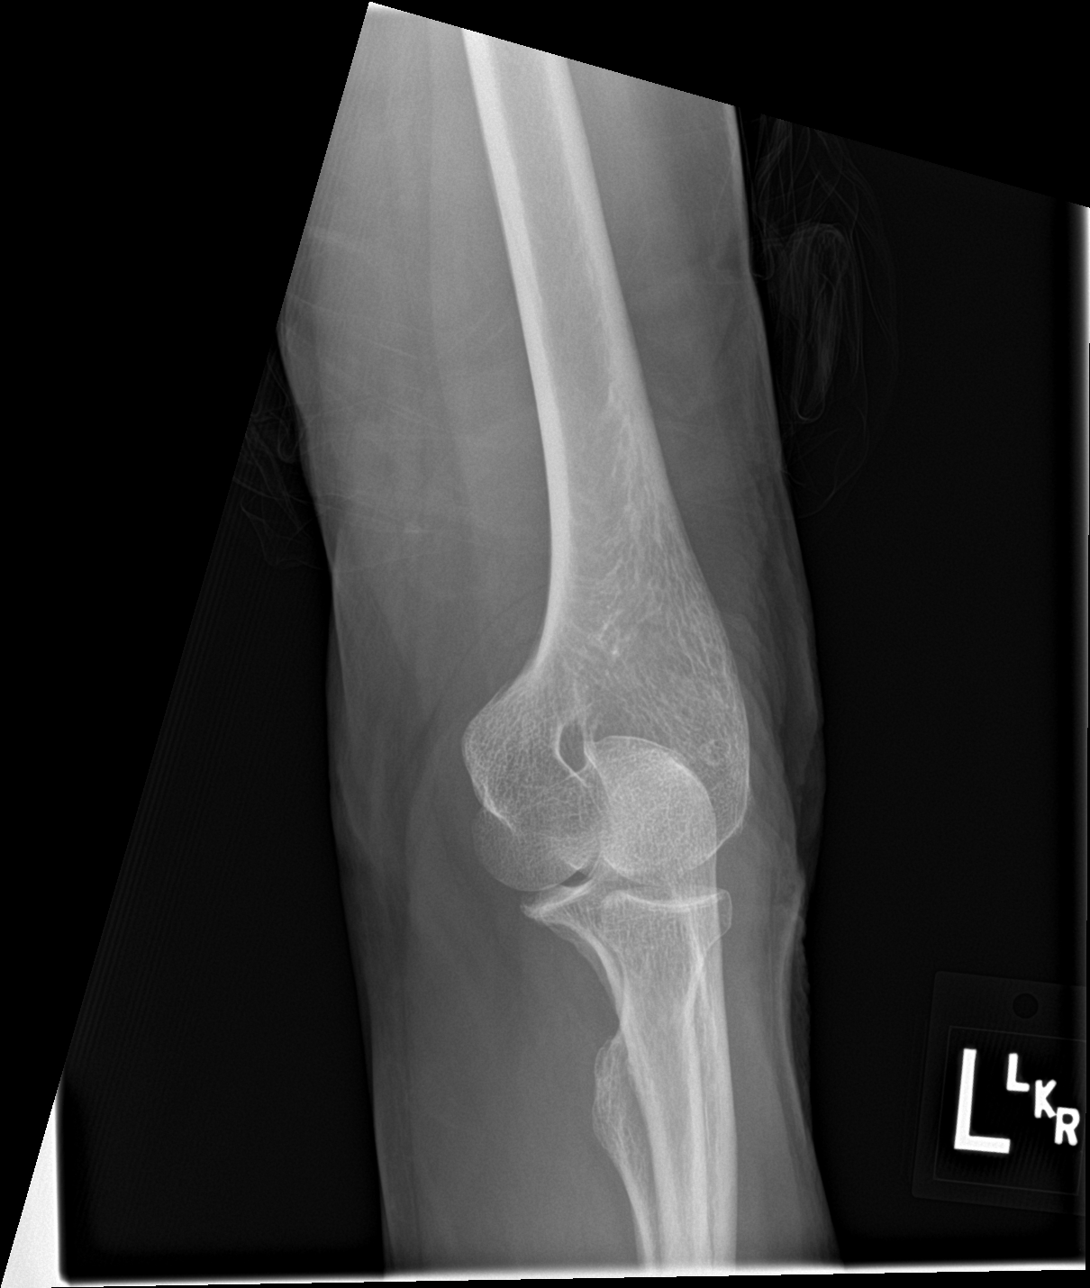

[elbow obl (2 of 2)]
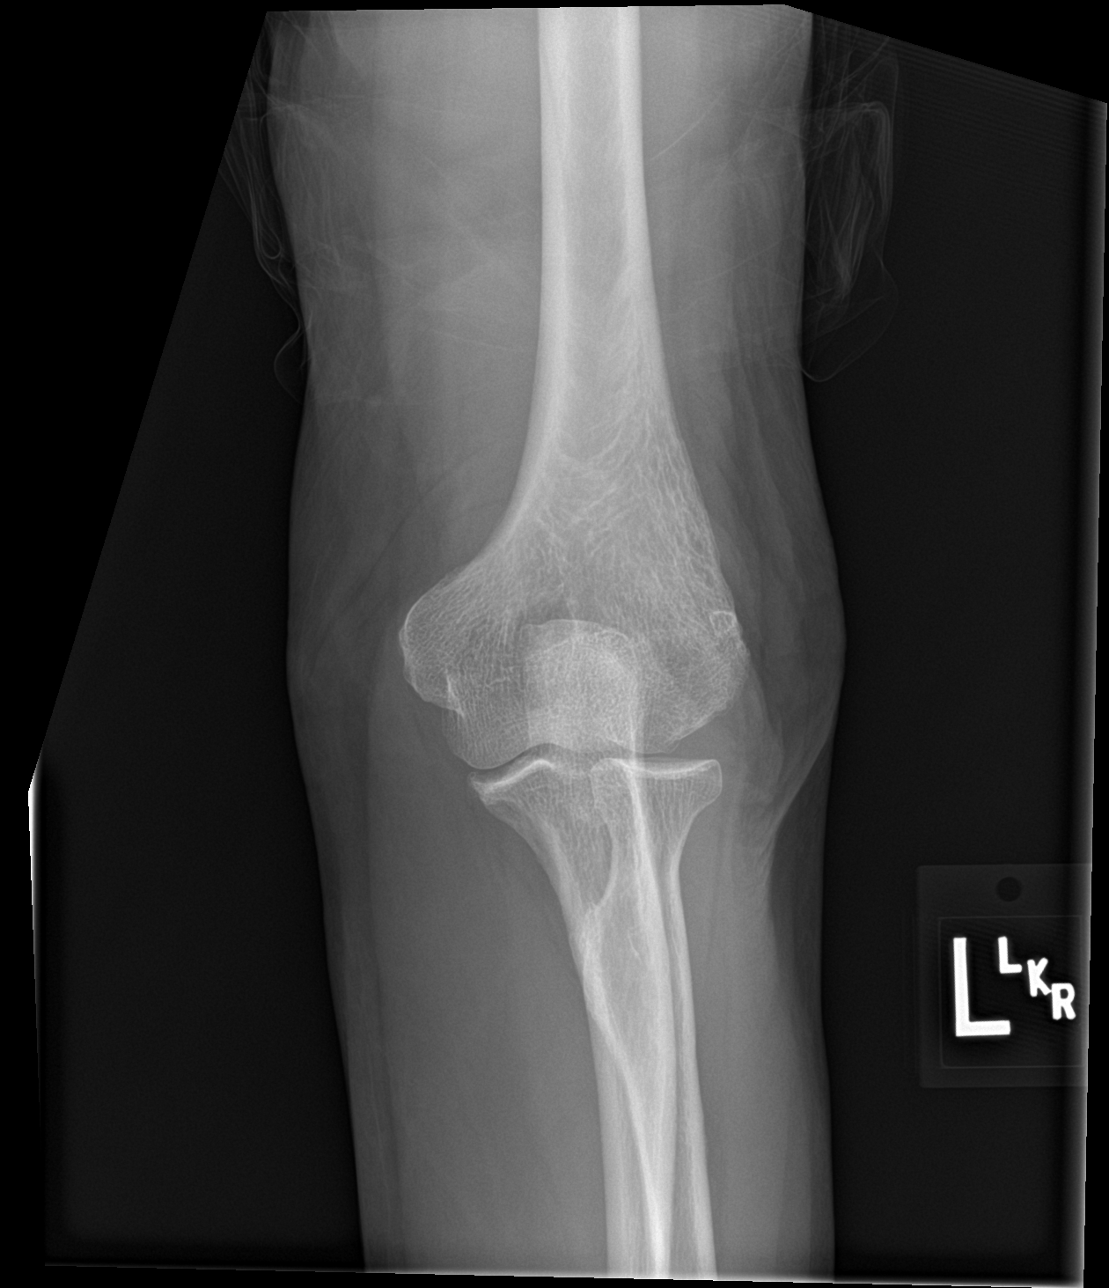

[elbow lat]
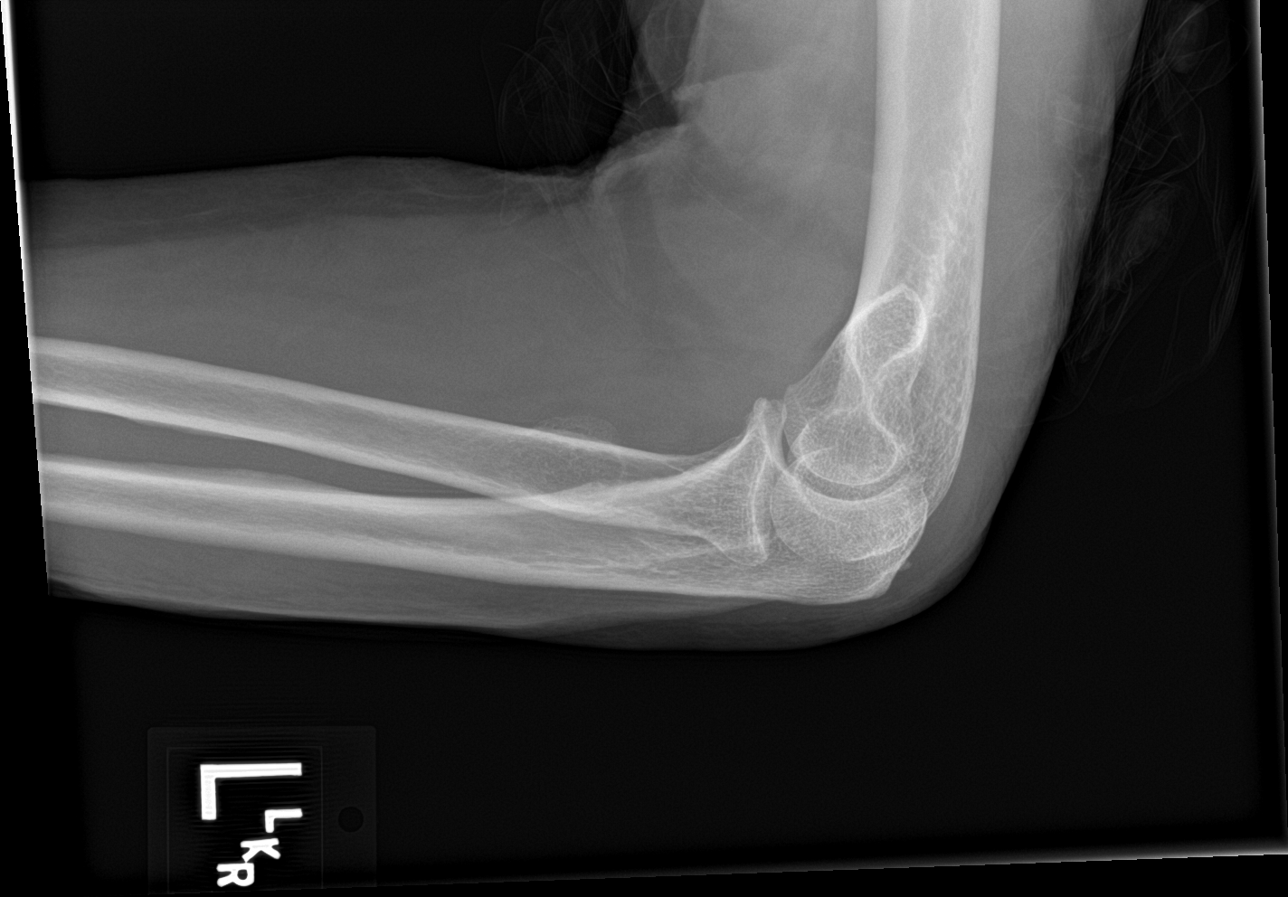

[4 of 4 positions shown; findings below may reference images not displayed]

FINDINGS: There is no evidence of fracture, dislocation, or joint effusion.
There is no evidence of arthropathy or other focal bone abnormality.
Soft tissues are unremarkable.
IMPRESSION: Negative.

## 2016-11-29 ENCOUNTER — Emergency Department
Admission: EM | Admit: 2016-11-29 | Discharge: 2016-11-29 | Disposition: A | Discharge status: Home / Self Care | Payer: Medicare Other | Attending: Emergency Medicine | Admitting: Emergency Medicine

## 2016-11-29 DIAGNOSIS — R531 Weakness: Secondary | ICD-10-CM | POA: Insufficient documentation

## 2016-11-29 DIAGNOSIS — E119 Type 2 diabetes mellitus without complications: Secondary | ICD-10-CM | POA: Insufficient documentation

## 2016-11-29 DIAGNOSIS — Z7984 Long term (current) use of oral hypoglycemic drugs: Secondary | ICD-10-CM | POA: Insufficient documentation

## 2016-11-29 DIAGNOSIS — Z79899 Other long term (current) drug therapy: Secondary | ICD-10-CM | POA: Diagnosis not present

## 2016-11-29 DIAGNOSIS — Z791 Long term (current) use of non-steroidal anti-inflammatories (NSAID): Secondary | ICD-10-CM | POA: Insufficient documentation

## 2016-11-29 DIAGNOSIS — Z8673 Personal history of transient ischemic attack (TIA), and cerebral infarction without residual deficits: Secondary | ICD-10-CM | POA: Diagnosis not present

## 2016-11-29 DIAGNOSIS — I10 Essential (primary) hypertension: Secondary | ICD-10-CM | POA: Diagnosis not present

## 2016-11-29 DIAGNOSIS — Z7982 Long term (current) use of aspirin: Secondary | ICD-10-CM | POA: Insufficient documentation

## 2016-11-29 DIAGNOSIS — G2581 Restless legs syndrome: Secondary | ICD-10-CM | POA: Diagnosis not present

## 2016-11-29 LAB — BASIC METABOLIC PANEL
Anion gap: 9 (ref 5–15)
BUN: 15 mg/dL (ref 6–20)
CALCIUM: 9.2 mg/dL (ref 8.9–10.3)
CO2: 28 mmol/L (ref 22–32)
CREATININE: 1.02 mg/dL — AB (ref 0.44–1.00)
Chloride: 105 mmol/L (ref 101–111)
GFR calc Af Amer: 59 mL/min — ABNORMAL LOW (ref >60)
GFR, EST NON AFRICAN AMERICAN: 51 mL/min — AB (ref >60)
GLUCOSE: 37 mg/dL — AB (ref 65–99)
Potassium: 3.1 mmol/L — ABNORMAL LOW (ref 3.5–5.1)
SODIUM: 142 mmol/L (ref 135–145)

## 2016-11-29 LAB — CBC
HCT: 33.8 % — ABNORMAL LOW (ref 35.0–47.0)
Hemoglobin: 11.3 g/dL — ABNORMAL LOW (ref 12.0–16.0)
MCH: 28.5 pg (ref 26.0–34.0)
MCHC: 33.4 g/dL (ref 32.0–36.0)
MCV: 85.4 fL (ref 80.0–100.0)
PLATELETS: 313 10*3/uL (ref 150–440)
RBC: 3.96 MIL/uL (ref 3.80–5.20)
RDW: 13.1 % (ref 11.5–14.5)
WBC: 7.6 10*3/uL (ref 3.6–11.0)

## 2016-11-29 LAB — GLUCOSE, CAPILLARY: GLUCOSE-CAPILLARY: 73 mg/dL (ref 65–99)

## 2016-11-29 MED ORDER — LORAZEPAM 0.5 MG PO TABS
0.5000 mg | ORAL_TABLET | Freq: Once | ORAL | Status: AC
  Administered 2016-11-29: 0.5 mg via ORAL
  Filled 2016-11-29: qty 1

## 2016-11-29 MED ORDER — DIAZEPAM 5 MG PO TABS: 2.5000 mg | ORAL_TABLET | Freq: Three times a day (TID) | ORAL | 0 refills | Status: DC | PRN

## 2016-11-29 MED ORDER — ROPINIROLE HCL 1 MG PO TABS
1.0000 mg | ORAL_TABLET | Freq: Once | ORAL | Status: AC
  Administered 2016-11-29: 1 mg via ORAL
  Filled 2016-11-29: qty 1

## 2016-11-29 NOTE — Discharge Instructions (Signed)
It was a pleasure to take care of you today, and thank you for coming to our emergency department.  If you have any questions or concerns before leaving please ask the nurse to grab me and I'm more than happy to go through your aftercare instructions again.  Try taking yellow mustard or apple cider vinegar as needed for the restless leg symptoms, in addition to your other medications.  If you have any concerns once you are home that you are not improving or are in fact getting worse before you can make it to your follow-up appointment, please do not hesitate to call 911 and come back for further evaluation.  You should return to the emergency room immediately if you have new or severe symptoms such as chest pain, difficulty breathing, passing out, high fever, severe pain, or unremitting vomiting.   Sharman CheekPhillip Zoriyah Scheidegger, MD

## 2016-11-29 NOTE — ED Triage Notes (Signed)
Pt to ED from home c/o weakness. Per EMS pt has hx of white matter disease and has become progressively weaker since yesterday. Pt reports feeling too weak to walk and c/o restless legs,pt denies NVD and denies pain.

## 2016-11-29 NOTE — ED Provider Notes (Signed)
[facility] Emergency Department Provider Note  ____________________________________________  Time seen: Approximately 3:38 PM  I have reviewed the triage vital signs and the nursing notes.   HISTORY  Chief Complaint Weakness    HPI Cheryl Mueller is a 76 y.o. female comes to the ED complaining of generalized weakness and worsening of her chronic restless leg syndrome today. She reports having a chronic neurologic condition which causes lower extremity weakness which overall is unchanged except today feels little bit worse. She frequently has falls including recently. She denies any head injury or concerning acute injuries. Tried taking her ropinirole 1 mg this morning without relief.Denies any fevers chills chest pain shortness of breath cough dysuria frequency urgency or other concerns. Eating and drinking normally.     Past Medical History:  Diagnosis Date  . Anxiety   . Arthritis   . Cervical myelopathy (HCC)   . Diabetes mellitus without complication (HCC)    Non Insulin dependant  . Headache    h/o migraines  . Hyperlipemia   . Hypertension   . Restless leg syndrome   . Stroke [facility]) 2002   mini stroke  . TIA (transient ischemic attack)    approx 15 years ago  . Vertigo    hx of  . White matter disease      Patient Active Problem List   Diagnosis Date Noted  . Anxiety and depression 05/11/2016  . 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 BP (high blood pressure) 10/20/2015  . HLD (hyperlipidemia) 10/20/2015  . Abdominal wall cellulitis 10/13/2015  . Anemia, iron deficiency 05/08/2015  . Deficiency in the vitamin folic acid 05/08/2015  . Compression fracture of lumbar vertebra (HCC) 05/08/2015  . Acute kidney injury (HCC) 04/20/2015  . Compression fracture of L1 lumbar vertebra (HCC) 04/18/2015  . Acute blood loss anemia 04/18/2015  . Hypokalemia 04/18/2015  .  Dehydration 04/18/2015  . Hematoma 04/17/2015  . Cellulitis 04/17/2015  . Benign hypertension 04/17/2015  . Diabetes mellitus (HCC) 04/17/2015  . Cystocele, midline 08/11/2014  . Abnormal presence of protein in urine 06/20/2014  . Type 2 diabetes mellitus with other diabetic kidney complication (HCC) 12/14/2013     Past Surgical History:  Procedure Laterality Date  . APPLICATION OF WOUND VAC  07/01/2016   Procedure: APPLICATION OF WOUND VAC;  Surgeon: Christena Flake, MD;  Location: [facility] ORS;  Service: Orthopedics;;  . BACK SURGERY  2012   kyphoplasty lower back  . COLONOSCOPY W/ POLYPECTOMY    . DEBRIDEMENT OF ABDOMINAL WALL ABSCESS N/A 10/16/2015   Procedure: I & D OF ABDOMINAL WALL ABSCESS;  Surgeon: Lattie Haw, MD;  Location: [facility] ORS;  Service: General;  Laterality: N/A;  . IRRIGATION AND DEBRIDEMENT SHOULDER Left 07/01/2016   Procedure: IRRIGATION AND DEBRIDEMENT SHOULDER;  Surgeon: Christena Flake, MD;  Location: [facility] ORS;  Service: Orthopedics;  Laterality: Left;  . KYPHOPLASTY N/A 04/19/2015   Procedure: KYPHOPLASTY L 1;  Surgeon: Kennedy Bucker, MD;  Location: [facility] ORS;  Service: Orthopedics;  Laterality: N/A;  . KYPHOPLASTY N/A 02/27/2016   Procedure: KYPHOPLASTY;  Surgeon: Kennedy Bucker, MD;  Location: [facility] ORS;  Service: Orthopedics;  Laterality: N/A;  . LUMBAR LAMINECTOMY/DECOMPRESSION MICRODISCECTOMY Right 11/26/2012   Procedure: LUMBAR LAMINECTOMY/DECOMPRESSION MICRODISCECTOMY 1 LEVEL;  Surgeon: Hewitt Shorts, MD;  Location: [facility];  Service: Neurosurgery;  Laterality: Right;  Lumbar five-sacral one laminotomy and microdiskectomy   . ORIF PATELLA Left 03/23/2015  Procedure: OPEN REDUCTION INTERNAL (ORIF) FIXATION PATELLA;  Surgeon: Kennedy Bucker, MD;  Location: [facility] ORS;  Service: Orthopedics;  Laterality: Left;  . REVERSE SHOULDER ARTHROPLASTY Left 04/16/2016   Procedure: REVERSE SHOULDER ARTHROPLASTY;  Surgeon: Christena Flake, MD;  Location: [facility] ORS;  Service:  Orthopedics;  Laterality: Left;     Prior to Admission medications   Medication Sig Start Date End Date Taking? Authorizing Provider  amLODipine (NORVASC) 10 MG tablet Take 10 mg by mouth daily.    Yes [provider]  atorvastatin (LIPITOR) 80 MG tablet Take 80 mg by mouth at bedtime.   Yes [provider]  glimepiride (AMARYL) 4 MG tablet Take 1 tablet (4 mg total) by mouth daily with breakfast. 04/21/15  Yes Curtis Sites III, MD  hydrochlorothiazide (MICROZIDE) 12.5 MG capsule Take 12.5 mg by mouth daily.    Yes [provider]  lisinopril (PRINIVIL,ZESTRIL) 40 MG tablet Take 40 mg by mouth daily.    Yes [provider]  metFORMIN (GLUCOPHAGE) 1000 MG tablet Take 1,000 mg by mouth 2 (two) times daily with a meal.   Yes [provider]  potassium chloride (K-DUR,KLOR-CON) 10 MEQ tablet Take 1 tablet by mouth daily. 05/10/15  Yes [provider]  rOPINIRole (REQUIP) 0.25 MG tablet Take 1 tablet (0.25 mg total) by mouth at bedtime. 04/21/15  Yes Curtis Sites III, MD  sitaGLIPtin (JANUVIA) 100 MG tablet Take 1 tablet by mouth daily. 09/05/15 11/29/16 Yes [provider]  traZODone (DESYREL) 50 MG tablet Take 0.5 tablets (25 mg total) by mouth at bedtime. 10/18/15  Yes Enid Baas, MD  aspirin-acetaminophen-caffeine (EXCEDRIN MIGRAINE) [phone] MG tablet Take 2 tablets by mouth every 8 (eight) hours as needed for headache.     [provider]  diazepam (VALIUM) 5 MG tablet Take 0.5 tablets (2.5 mg total) by mouth every 8 (eight) hours as needed for muscle spasms. 11/29/16   Sharman Cheek, MD  diphenhydrAMINE (BENADRYL) 25 mg capsule Take 25 mg by mouth every 6 (six) hours as needed for itching.    [provider]  hydrOXYzine (ATARAX/VISTARIL) 25 MG tablet Take 25 mg by mouth 3 (three) times daily as needed.    [provider]  Ibuprofen-Diphenhydramine Cit (IBUPROFEN PM) 200-38 MG TABS Take 2 tablets  by mouth at bedtime as needed (for sleep).    [provider]  mometasone (ELOCON) 0.1 % cream Apply 1 application topically daily as needed (for itching).    [provider]  oxyCODONE (OXY IR/ROXICODONE) 5 MG immediate release tablet Take 1 tablet (5 mg total) by mouth 2 (two) times daily as needed for breakthrough pain. 07/03/16   Delfino Lovett, MD  venlafaxine XR (EFFEXOR-XR) 75 MG 24 hr capsule Take 1 capsule by mouth daily. 06/19/16   [provider]     Allergies Alendronate; Atorvastatin; and Formaldehyde   Family History  Problem Relation Age of Onset  . Stroke Mother   . Hypertension Mother   . Diabetes Father   . Breast cancer Neg Hx     Social History Social History  Substance Use Topics  . Smoking status: Never Smoker  . Smokeless tobacco: Never Used  . Alcohol use No    Review of Systems  Constitutional:   No fever or chills.  ENT:   No sore throat. No rhinorrhea. Cardiovascular:   No chest pain or syncope. Respiratory:   No dyspnea or cough. Gastrointestinal:   Negative for abdominal pain, vomiting and diarrhea.  Musculoskeletal:   Negative for focal pain or swelling All other systems reviewed and are negative except as documented above in ROS and HPI.  ____________________________________________   PHYSICAL EXAM:  VITAL SIGNS: ED Triage Vitals  Enc Vitals Group     BP 11/29/16 1439 (!) 197/76     Pulse Rate 11/29/16 1439 97     Resp 11/29/16 1439 15     Temp 11/29/16 1439 98.4 F (36.9 C)     Temp Source 11/29/16 1439 Oral     SpO2 11/29/16 1439 96 %     Weight 11/29/16 1444 120 lb (54.4 kg)     Height 11/29/16 1444 4\' 11"  (1.499 m)     Head Circumference --      Peak Flow --      Pain Score --      Pain Loc --      Pain Edu? --      Excl. in GC? --     Vital signs reviewed, nursing assessments reviewed.   Constitutional:   Alert and oriented. Well appearing and in no distress. Eyes:   No scleral icterus.  EOMI.  No nystagmus. No conjunctival pallor. PERRL. ENT   Head:   Normocephalic and atraumatic.   Nose:   No congestion/rhinnorhea.    Mouth/Throat:   MMM, no pharyngeal erythema. No peritonsillar mass.    Neck:   No meningismus. Full ROM Hematological/Lymphatic/Immunilogical:   No cervical lymphadenopathy. Cardiovascular:   RRR. Symmetric bilateral radial and DP pulses.  No murmurs.  Respiratory:   Normal respiratory effort without tachypnea/retractions. Breath sounds are clear and equal bilaterally. No wheezes/rales/rhonchi. Gastrointestinal:   Soft and nontender. Non distended. There is no CVA tenderness.  No rebound, rigidity, or guarding. Genitourinary:   deferred Musculoskeletal:   Normal range of motion in all extremities. No joint effusions.  No lower extremity tenderness.  No edema. Neurologic:   Normal speech and language.  Motor grossly intact. No gross focal neurologic deficits are appreciated.  Skin:    Skin is warm, dry and intact. Multiple bruises. No lacerations.  ____________________________________________    LABS (pertinent positives/negatives) (all labs ordered are listed, but only abnormal results are displayed) Labs Reviewed  BASIC METABOLIC PANEL - Abnormal; Notable for the following:       Result Value   Potassium 3.1 (*)    Glucose, Bld 37 (*)    Creatinine, Ser 1.02 (*)    GFR calc non Af Amer 51 (*)    GFR calc Af Amer 59 (*)    All other components within normal limits  CBC - Abnormal; Notable for the following:    Hemoglobin 11.3 (*)    HCT 33.8 (*)    All other components within normal limits  URINALYSIS, COMPLETE (UACMP) WITH MICROSCOPIC   ____________________________________________   EKG  Interpreted by me Sinus rhythm rate of 98, normal axis and intervals.  Voltage criteria for LVH in the high lateral leads. T-wave inversion in lead 3 which is isolated and nonspecific. Otherwise normal QRS ST segments and T  waves.  ____________________________________________    RADIOLOGY  No results found.  ____________________________________________   PROCEDURES Procedures  ____________________________________________   INITIAL IMPRESSION / ASSESSMENT AND PLAN / ED COURSE  Pertinent labs & imaging results that were available during my care of the patient were reviewed by me and considered in my medical decision making (see chart for details).    Clinical Course as of Nov 30 1851  Fri Nov 29, 2016  1516 Pt well appearing, c/o restless leg sx worsened today. Not relieved by her 1mg  requip. Rx says she can take 2mg  at a time. I'll give additional requip, low dose ativan, check labs.  [PS]  1849 Chem normal except glucose. Pt sx not c/w hypoglycemia. There was a 3 hour delay between lab draw and analysis, which is surely the cause of this result. Suitable for DC home.  Glucose: (!!) 37 [PS]    Clinical Course User Index [PS] Sharman CheekStafford, Chaquetta Schlottman, MD     ____________________________________________   FINAL CLINICAL IMPRESSION(S) / ED DIAGNOSES  Final diagnoses:  Generalized weakness  Restless legs syndrome (RLS)      New Prescriptions   DIAZEPAM (VALIUM) 5 MG TABLET    Take 0.5 tablets (2.5 mg total) by mouth every 8 (eight) hours as needed for muscle spasms.     Portions of this note were generated with dragon dictation software. Dictation errors may occur despite best attempts at proofreading.    Sharman CheekStafford, Kiyana Vazguez, MD 11/29/16 [id number]

## 2016-12-04 ENCOUNTER — Emergency Department
Admission: EM | Admit: 2016-12-04 | Discharge: 2016-12-04 | Disposition: A | Discharge status: Home / Self Care | Payer: Medicare Other | Attending: Emergency Medicine | Admitting: Emergency Medicine

## 2016-12-04 ENCOUNTER — Emergency Department: Payer: Medicare Other

## 2016-12-04 ENCOUNTER — Encounter: Payer: Self-pay | Admitting: Emergency Medicine

## 2016-12-04 DIAGNOSIS — L299 Pruritus, unspecified: Secondary | ICD-10-CM | POA: Diagnosis not present

## 2016-12-04 DIAGNOSIS — Z7982 Long term (current) use of aspirin: Secondary | ICD-10-CM | POA: Diagnosis not present

## 2016-12-04 DIAGNOSIS — I10 Essential (primary) hypertension: Secondary | ICD-10-CM | POA: Insufficient documentation

## 2016-12-04 DIAGNOSIS — Z8673 Personal history of transient ischemic attack (TIA), and cerebral infarction without residual deficits: Secondary | ICD-10-CM | POA: Diagnosis not present

## 2016-12-04 DIAGNOSIS — E1129 Type 2 diabetes mellitus with other diabetic kidney complication: Secondary | ICD-10-CM | POA: Diagnosis not present

## 2016-12-04 DIAGNOSIS — R079 Chest pain, unspecified: Secondary | ICD-10-CM | POA: Diagnosis not present

## 2016-12-04 DIAGNOSIS — Z7984 Long term (current) use of oral hypoglycemic drugs: Secondary | ICD-10-CM | POA: Diagnosis not present

## 2016-12-04 LAB — CBC
HEMATOCRIT: 31.2 % — AB (ref 35.0–47.0)
Hemoglobin: 10.4 g/dL — ABNORMAL LOW (ref 12.0–16.0)
MCH: 28.7 pg (ref 26.0–34.0)
MCHC: 33.2 g/dL (ref 32.0–36.0)
MCV: 86.5 fL (ref 80.0–100.0)
PLATELETS: 260 10*3/uL (ref 150–440)
RBC: 3.6 MIL/uL — AB (ref 3.80–5.20)
RDW: 13.3 % (ref 11.5–14.5)
WBC: 8.4 10*3/uL (ref 3.6–11.0)

## 2016-12-04 LAB — BASIC METABOLIC PANEL
Anion gap: 7 (ref 5–15)
BUN: 14 mg/dL (ref 6–20)
CHLORIDE: 103 mmol/L (ref 101–111)
CO2: 26 mmol/L (ref 22–32)
CREATININE: 0.68 mg/dL (ref 0.44–1.00)
Calcium: 8.4 mg/dL — ABNORMAL LOW (ref 8.9–10.3)
Glucose, Bld: 452 mg/dL — ABNORMAL HIGH (ref 65–99)
POTASSIUM: 3.1 mmol/L — AB (ref 3.5–5.1)
Sodium: 136 mmol/L (ref 135–145)

## 2016-12-04 LAB — TROPONIN I
TROPONIN I: 0.03 ng/mL — AB (ref <0.03)
Troponin I: <0.03 ng/mL (ref <0.03)

## 2016-12-04 MED ORDER — DIPHENHYDRAMINE HCL 50 MG/ML IJ SOLN
12.5000 mg | Freq: Once | INTRAMUSCULAR | Status: AC
  Administered 2016-12-04: 12.5 mg via INTRAVENOUS
  Filled 2016-12-04: qty 1

## 2016-12-04 MED ORDER — HYDROXYZINE HCL 25 MG PO TABS
25.0000 mg | ORAL_TABLET | Freq: Once | ORAL | Status: AC
  Administered 2016-12-04: 25 mg via ORAL
  Filled 2016-12-04: qty 1

## 2016-12-04 MED ORDER — ROPINIROLE HCL 0.25 MG PO TABS
0.2500 mg | ORAL_TABLET | Freq: Once | ORAL | Status: AC
  Administered 2016-12-04: 0.25 mg via ORAL
  Filled 2016-12-04: qty 1

## 2016-12-04 MED ORDER — GI COCKTAIL ~~LOC~~
30.0000 mL | Freq: Once | ORAL | Status: AC
  Administered 2016-12-04: 30 mL via ORAL
  Filled 2016-12-04: qty 30

## 2016-12-04 NOTE — ED Provider Notes (Addendum)
[facility] Emergency Department Provider Note  Time seen: 2:50 PM  I have reviewed the triage vital signs and the nursing notes.   HISTORY  Chief Complaint Chest Pain    HPI Cheryl Schoonernn C Mule is a 76 y.o. female with a past medical history of anxiety, diabetes, hypertension, hyperlipidemia, TIA, presents to the emergency department with chest pain. According to the patient she awoke on 8:00 this morning with chest discomfort which she describes as a dull constant pain to the center of her chest. Denies any shortness of breath, nausea or diaphoresis. As a secondary complaint the patient also states diffuse itching which has been ongoing for years, she sees a dermatologist for this condition. Currently the patient states mild chest discomfort she states her main discomfort comes from all the itching which she states is chronic. Denies any leg pain or swelling. Denies any fever or cough congestion or recent illness.  Past Medical History:  Diagnosis Date  . Anxiety   . Arthritis   . Cervical myelopathy (HCC)   . Diabetes mellitus without complication (HCC)    Non Insulin dependant  . Headache    h/o migraines  . Hyperlipemia   . Hypertension   . Restless leg syndrome   . Stroke [facility]) 2002   mini stroke  . TIA (transient ischemic attack)    approx 15 years ago  . Vertigo    hx of  . White matter disease     Patient Active Problem List   Diagnosis Date Noted  . Anxiety and depression 05/11/2016  . 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 BP (high blood pressure) 10/20/2015  . HLD (hyperlipidemia) 10/20/2015  . Abdominal wall cellulitis 10/13/2015  . Anemia, iron deficiency 05/08/2015  . Deficiency in the vitamin folic acid 05/08/2015  . Compression fracture of lumbar vertebra (HCC) 05/08/2015  . Acute kidney injury (HCC) 04/20/2015  . Compression fracture of L1 lumbar vertebra (HCC)  04/18/2015  . Acute blood loss anemia 04/18/2015  . Hypokalemia 04/18/2015  . Dehydration 04/18/2015  . Hematoma 04/17/2015  . Cellulitis 04/17/2015  . Benign hypertension 04/17/2015  . Diabetes mellitus (HCC) 04/17/2015  . Cystocele, midline 08/11/2014  . Abnormal presence of protein in urine 06/20/2014  . Type 2 diabetes mellitus with other diabetic kidney complication (HCC) 12/14/2013    Past Surgical History:  Procedure Laterality Date  . APPLICATION OF WOUND VAC  07/01/2016   Procedure: APPLICATION OF WOUND VAC;  Surgeon: Christena FlakeJohn J Poggi, MD;  Location: [facility] ORS;  Service: Orthopedics;;  . BACK SURGERY  2012   kyphoplasty lower back  . COLONOSCOPY W/ POLYPECTOMY    . DEBRIDEMENT OF ABDOMINAL WALL ABSCESS N/A 10/16/2015   Procedure: I & D OF ABDOMINAL WALL ABSCESS;  Surgeon: Lattie Hawichard E Cooper, MD;  Location: [facility] ORS;  Service: General;  Laterality: N/A;  . IRRIGATION AND DEBRIDEMENT SHOULDER Left 07/01/2016   Procedure: IRRIGATION AND DEBRIDEMENT SHOULDER;  Surgeon: Christena FlakeJohn J Poggi, MD;  Location: [facility] ORS;  Service: Orthopedics;  Laterality: Left;  . KYPHOPLASTY N/A 04/19/2015   Procedure: KYPHOPLASTY L 1;  Surgeon: Kennedy BuckerMichael Menz, MD;  Location: [facility] ORS;  Service: Orthopedics;  Laterality: N/A;  . KYPHOPLASTY N/A 02/27/2016   Procedure: KYPHOPLASTY;  Surgeon: Kennedy BuckerMichael Menz, MD;  Location: [facility] ORS;  Service: Orthopedics;  Laterality: N/A;  . LUMBAR LAMINECTOMY/DECOMPRESSION MICRODISCECTOMY Right 11/26/2012   Procedure: LUMBAR LAMINECTOMY/DECOMPRESSION MICRODISCECTOMY 1 LEVEL;  Surgeon: Hewitt Shortsobert W Nudelman, MD;  Location:  [facility];  Service: Neurosurgery;  Laterality: Right;  Lumbar five-sacral one laminotomy and microdiskectomy   . ORIF PATELLA Left 03/23/2015   Procedure: OPEN REDUCTION INTERNAL (ORIF) FIXATION PATELLA;  Surgeon: Kennedy Bucker, MD;  Location: [facility] ORS;  Service: Orthopedics;  Laterality: Left;  . REVERSE SHOULDER ARTHROPLASTY Left 04/16/2016   Procedure: REVERSE SHOULDER  ARTHROPLASTY;  Surgeon: Christena Flake, MD;  Location: [facility] ORS;  Service: Orthopedics;  Laterality: Left;    Prior to Admission medications   Medication Sig Start Date End Date Taking? Authorizing Provider  amLODipine (NORVASC) 10 MG tablet Take 10 mg by mouth daily.     [provider]  aspirin-acetaminophen-caffeine (EXCEDRIN MIGRAINE) [phone] MG tablet Take 2 tablets by mouth every 8 (eight) hours as needed for headache.     [provider]  atorvastatin (LIPITOR) 80 MG tablet Take 80 mg by mouth at bedtime.    [provider]  diazepam (VALIUM) 5 MG tablet Take 0.5 tablets (2.5 mg total) by mouth every 8 (eight) hours as needed for muscle spasms. 11/29/16   Sharman Cheek, MD  diphenhydrAMINE (BENADRYL) 25 mg capsule Take 25 mg by mouth every 6 (six) hours as needed for itching.    [provider]  glimepiride (AMARYL) 4 MG tablet Take 1 tablet (4 mg total) by mouth daily with breakfast. 04/21/15   Curtis Sites III, MD  hydrochlorothiazide (MICROZIDE) 12.5 MG capsule Take 12.5 mg by mouth daily.     [provider]  hydrOXYzine (ATARAX/VISTARIL) 25 MG tablet Take 25 mg by mouth 3 (three) times daily as needed.    [provider]  Ibuprofen-Diphenhydramine Cit (IBUPROFEN PM) 200-38 MG TABS Take 2 tablets by mouth at bedtime as needed (for sleep).    [provider]  lisinopril (PRINIVIL,ZESTRIL) 40 MG tablet Take 40 mg by mouth daily.     [provider]  metFORMIN (GLUCOPHAGE) 1000 MG tablet Take 1,000 mg by mouth 2 (two) times daily with a meal.    [provider]  mometasone (ELOCON) 0.1 % cream Apply 1 application topically daily as needed (for itching).    [provider]  oxyCODONE (OXY IR/ROXICODONE) 5 MG immediate release tablet Take 1 tablet (5 mg total) by mouth 2 (two) times daily as needed for breakthrough pain. 07/03/16   Delfino Lovett, MD  potassium chloride (K-DUR,KLOR-CON) 10 MEQ tablet  Take 1 tablet by mouth daily. 05/10/15   [provider]  rOPINIRole (REQUIP) 0.25 MG tablet Take 1 tablet (0.25 mg total) by mouth at bedtime. 04/21/15   Curtis Sites III, MD  sitaGLIPtin (JANUVIA) 100 MG tablet Take 1 tablet by mouth daily. 09/05/15 11/29/16  [provider]  traZODone (DESYREL) 50 MG tablet Take 0.5 tablets (25 mg total) by mouth at bedtime. 10/18/15   Enid Baas, MD  venlafaxine XR (EFFEXOR-XR) 75 MG 24 hr capsule Take 1 capsule by mouth daily. 06/19/16   [provider]    Allergies  Allergen Reactions  . Alendronate Other (See Comments)    Reaction:  Leg pain   . Atorvastatin Rash  . Formaldehyde Rash    Family History  Problem Relation Age of Onset  . Stroke Mother   . Hypertension Mother   . Diabetes Father   . Breast cancer Neg Hx     Social History Social History  Substance Use Topics  . Smoking status: Never Smoker  . Smokeless tobacco: Never Used  . Alcohol use No  Review of Systems Constitutional: Negative for fever. Eyes: Negative for visual changes. ENT: Negative for congestion Cardiovascular: Mild dull central chest pain Respiratory: Negative for shortness of breath. Gastrointestinal: Negative for abdominal pain, vomiting and diarrhea. Genitourinary: Negative for dysuria. Musculoskeletal: Negative for leg pain or swelling. Skin: Diffuse itching which she states is chronic. Neurological: Negative for headache All other ROS negative  ____________________________________________   PHYSICAL EXAM:  VITAL SIGNS: ED Triage Vitals  Enc Vitals Group     BP 12/04/16 1412 (!) 186/71     Pulse Rate 12/04/16 1412 84     Resp 12/04/16 1412 18     Temp 12/04/16 1412 98.1 F (36.7 C)     Temp Source 12/04/16 1412 Oral     SpO2 12/04/16 1412 98 %     Weight 12/04/16 1406 120 lb (54.4 kg)     Height 12/04/16 1406 4' 11.5" (1.511 m)     Head Circumference --      Peak Flow --      Pain Score 12/04/16 1406 6      Pain Loc --      Pain Edu? --      Excl. in GC? --     Constitutional: Alert and oriented. Well appearing and in no distress. Eyes: Normal exam ENT   Head: Normocephalic and atraumatic.   Mouth/Throat: Mucous membranes are moist. Cardiovascular: Normal rate, regular rhythm. No murmur Respiratory: Normal respiratory effort without tachypnea nor retractions. Breath sounds are clear  Gastrointestinal: Soft and nontender. No distention.  Musculoskeletal: Nontender with normal range of motion in all extremities. Neurologic:  Normal speech and language. No gross focal neurologic deficits  Skin:  Skin is warm, dry, several erythematous areas over her upper extremities chest and back which she states are chronic but are very itchy. Psychiatric: Mood and affect are normal.   ____________________________________________    EKG  EKG reviewed and interpreted by myself shows normal sinus rhythm at 83 bpm, narrow QRS, normal axis, normal intervals, nonspecific ST changes without ST elevation.  ____________________________________________    RADIOLOGY  Chest x-ray is negative  ____________________________________________   INITIAL IMPRESSION / ASSESSMENT AND PLAN / ED COURSE  Pertinent labs & imaging results that were available during my care of the patient were reviewed by me and considered in my medical decision making (see chart for details).  Patient presents to the emergency departments with mild central chest discomfort since 8:00 this morning. Also with diffuse generalized itching which she states is chronic. We will check labs in the emergency department, chest x-ray and EKG. We will treat the itching. Patient states a history of gastric reflux as well, when asked if this feels like gastric reflux she states may be. We will treat with a GI cocktail. We will continue to closely monitor in the emergency department.  Patient's labs are largely within normal limits, EKG is  reassuring and chest x-rays negative. Patient's troponin is negative. Patient received aspirin by EMS. We'll obtain a repeat troponin around 4:30 PM. If her repeat troponin is negative I date the patient will be able to be discharged from the emergency department. Patient care signed out to oncoming physician.  Chest pain is gone.  Her only complaint currently is continued itching which she states is chronic.  States she is prescribed requip, which helps her with itching sensation, we will dose home dose in the ED.  Repeat trop schedueled for 4:30pm.  ____________________________________________   FINAL CLINICAL IMPRESSION(S) / ED DIAGNOSES  Chest pain    Minna Antis, MD 12/04/16 1453    Minna Antis, MD 12/04/16 1534

## 2016-12-04 NOTE — Discharge Instructions (Signed)

## 2016-12-04 NOTE — ED Notes (Signed)
Date and time results received: 12/04/16 5:13 PM  (use smartphrase ".now" to insert current time)  Test: troponin Critical Value: 0.03  Name of Provider Notified: Dr. York CeriseForbach  Orders Received? Or Actions Taken?: Orders Received - See Orders for details

## 2016-12-04 NOTE — ED Notes (Signed)
Pt stating she is itching all over. Pt has been repositioned in bed multiple times. Pt states she has restless leg syndrome.

## 2016-12-04 NOTE — ED Provider Notes (Signed)
Clinical Course as of Dec 05 1751  Wed Dec 04, 2016  1521 Assuming care from Dr. Lenard LancePaduchowski.  In short, Cheryl Mueller is a [age] y.o. female with a chief complaint of chest pain and itching (which has been going on for years).  Refer to the original H&P for additional details.  The current plan of care is to follow up second troponin and reassess.  Repeat is scheduled for 4:30pm.  [CF]  1715 The patient's second troponin is 0.03.  I had a conversation with her and she states that she is not concerned about any chest pain at this time, her main issue is the chronic itching.  She has not had any new or worsening chest pain since being here and given that we have had numerous troponin 0.03 in the past and that she did not show any clinically significant change between the 2 troponins and she is not having ongoing chest pain, I will discharge her for outpatient follow-up.  I explained to her that she is already received her usual medicine in addition to Benadryl and Atarax and I do not have anything else to offer her in terms of helping with the itching.  I did consider a course of steroids but I am concerned about her age and the effect the series may have on her.  I encouraged her to follow closely with dermatology as well as with her primary care doctor, Dr. Graciela HusbandsKlein.  I gave my usual and customary return precautions.    Troponin I: (!!) 0.03 [CF]    Clinical Course User Index [CF] [name], Abigayl Hor, MD      [name], Jakyri Brunkhorst, MD 12/04/16 [phone]

## 2016-12-04 NOTE — ED Notes (Signed)
Pt states she can't stand the itching and wants something else for the itching. Dr. York CeriseForbach notified.

## 2016-12-04 NOTE — ED Notes (Signed)
Patient transported to X-ray 

## 2016-12-04 NOTE — ED Notes (Signed)
Pt incontinent. Hygiene given and clean, dry brief applied.

## 2016-12-04 NOTE — ED Triage Notes (Signed)
Pt arrived via EMS from home for reports of left sided non radiating chest pain that began today. EMS reports 160/85, 94HR, 96% RA SpO2, NSR. EMS administered ASA 324mg  ASA.

## 2016-12-15 ENCOUNTER — Emergency Department: Payer: Medicare Other

## 2016-12-15 ENCOUNTER — Emergency Department
Admission: EM | Admit: 2016-12-15 | Discharge: 2016-12-15 | Disposition: A | Discharge status: Home / Self Care | Payer: Medicare Other | Attending: Emergency Medicine | Admitting: Emergency Medicine

## 2016-12-15 ENCOUNTER — Encounter: Payer: Self-pay | Admitting: Emergency Medicine

## 2016-12-15 DIAGNOSIS — R0789 Other chest pain: Secondary | ICD-10-CM | POA: Diagnosis present

## 2016-12-15 DIAGNOSIS — R52 Pain, unspecified: Secondary | ICD-10-CM | POA: Diagnosis not present

## 2016-12-15 DIAGNOSIS — Z8673 Personal history of transient ischemic attack (TIA), and cerebral infarction without residual deficits: Secondary | ICD-10-CM | POA: Diagnosis not present

## 2016-12-15 DIAGNOSIS — R7989 Other specified abnormal findings of blood chemistry: Secondary | ICD-10-CM | POA: Diagnosis not present

## 2016-12-15 DIAGNOSIS — G2581 Restless legs syndrome: Secondary | ICD-10-CM | POA: Diagnosis not present

## 2016-12-15 DIAGNOSIS — E119 Type 2 diabetes mellitus without complications: Secondary | ICD-10-CM | POA: Insufficient documentation

## 2016-12-15 DIAGNOSIS — Z7984 Long term (current) use of oral hypoglycemic drugs: Secondary | ICD-10-CM | POA: Insufficient documentation

## 2016-12-15 DIAGNOSIS — R778 Other specified abnormalities of plasma proteins: Secondary | ICD-10-CM

## 2016-12-15 LAB — CBC
HCT: 33.6 % — ABNORMAL LOW (ref 35.0–47.0)
HEMOGLOBIN: 11.2 g/dL — AB (ref 12.0–16.0)
MCH: 28.2 pg (ref 26.0–34.0)
MCHC: 33.2 g/dL (ref 32.0–36.0)
MCV: 84.9 fL (ref 80.0–100.0)
Platelets: 498 10*3/uL — ABNORMAL HIGH (ref 150–440)
RBC: 3.96 MIL/uL (ref 3.80–5.20)
RDW: 13.1 % (ref 11.5–14.5)
WBC: 11.8 10*3/uL — ABNORMAL HIGH (ref 3.6–11.0)

## 2016-12-15 LAB — BASIC METABOLIC PANEL
Anion gap: 13 (ref 5–15)
BUN: 52 mg/dL — ABNORMAL HIGH (ref 6–20)
CALCIUM: 9.4 mg/dL (ref 8.9–10.3)
CO2: 24 mmol/L (ref 22–32)
CREATININE: 1.4 mg/dL — AB (ref 0.44–1.00)
Chloride: 95 mmol/L — ABNORMAL LOW (ref 101–111)
GFR calc non Af Amer: 35 mL/min — ABNORMAL LOW (ref >60)
GFR, EST AFRICAN AMERICAN: 41 mL/min — AB (ref >60)
GLUCOSE: 170 mg/dL — AB (ref 65–99)
Potassium: 3.8 mmol/L (ref 3.5–5.1)
Sodium: 132 mmol/L — ABNORMAL LOW (ref 135–145)

## 2016-12-15 LAB — TROPONIN I
TROPONIN I: 0.06 ng/mL — AB (ref <0.03)
Troponin I: 0.05 ng/mL (ref <0.03)

## 2016-12-15 MED ORDER — HYDROXYZINE HCL 25 MG PO TABS
25.0000 mg | ORAL_TABLET | Freq: Once | ORAL | Status: AC
  Administered 2016-12-15: 25 mg via ORAL
  Filled 2016-12-15: qty 1

## 2016-12-15 MED ORDER — ROPINIROLE HCL 0.25 MG PO TABS: 0.5000 mg | ORAL_TABLET | Freq: Every day | ORAL | 0 refills | Status: AC

## 2016-12-15 MED ORDER — ROPINIROLE HCL 0.25 MG PO TABS
0.5000 mg | ORAL_TABLET | ORAL | Status: AC
  Administered 2016-12-15: 0.5 mg via ORAL
  Filled 2016-12-15: qty 1

## 2016-12-15 MED ORDER — FAMOTIDINE 20 MG PO TABS
40.0000 mg | ORAL_TABLET | Freq: Once | ORAL | Status: AC
  Administered 2016-12-15: 40 mg via ORAL
  Filled 2016-12-15: qty 2

## 2016-12-15 MED ORDER — ASPIRIN EC 81 MG PO TBEC: 81.0000 mg | DELAYED_RELEASE_TABLET | Freq: Every day | ORAL | 0 refills | Status: AC

## 2016-12-15 MED ORDER — ASPIRIN 81 MG PO CHEW
324.0000 mg | CHEWABLE_TABLET | Freq: Once | ORAL | Status: AC
  Administered 2016-12-15: 324 mg via ORAL

## 2016-12-15 MED ORDER — ALUM & MAG HYDROXIDE-SIMETH 200-200-20 MG/5ML PO SUSP
15.0000 mL | Freq: Once | ORAL | Status: AC
  Administered 2016-12-15: 15 mL via ORAL
  Filled 2016-12-15: qty 30

## 2016-12-15 NOTE — Discharge Instructions (Signed)
Your tests today do not show any significant findings that would require hospitalization.  You should follow up with your primary care doctor and cardiology for further evaluation.

## 2016-12-15 NOTE — ED Notes (Signed)
Pt requests to see provider prior to discharge. " I cant go home like this, I need some sleep or something to knock me out"

## 2016-12-15 NOTE — ED Notes (Signed)
Pt reports continued pain medication and is requesting "morphine" and to "be knocked out." MD made aware.

## 2016-12-15 NOTE — ED Provider Notes (Signed)
[facility] Emergency Department Provider Note  ____________________________________________  Time seen: Approximately 7:42 PM  I have reviewed the triage vital signs and the nursing notes.   HISTORY  Chief Complaint Chest Pain    HPI Cheryl Mueller is a 76 y.o. female who complains of chest pain 2 days that has been constant. It's in anterior chest, aching, nonradiating. No aggravating or alleviating factors. Not exertional, not pleuritic. No cough. No diaphoresis vomiting. Contrary to triage note, no shortness of breath.She's been seen in the ED previously for same symptoms. Actually when asked to describe where she feels the pain, she states "it hurts all over" and actually does not differentiate between her chest pain and the rest of her body pain which is chronic in nature related to her restless leg syndrome. She reports seeing her primary care doctor for the same symptoms who, according to the patient, told her that they did not know why she is feeling this way.     Past Medical History:  Diagnosis Date  . Anxiety   . Arthritis   . Cervical myelopathy (HCC)   . Diabetes mellitus without complication (HCC)    Non Insulin dependant  . Headache    h/o migraines  . Hyperlipemia   . Hypertension   . Restless leg syndrome   . Stroke [facility]) 2002   mini stroke  . TIA (transient ischemic attack)    approx 15 years ago  . Vertigo    hx of  . White matter disease      Patient Active Problem List   Diagnosis Date Noted  . Anxiety and depression 05/11/2016  . 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 BP (high blood pressure) 10/20/2015  . HLD (hyperlipidemia) 10/20/2015  . Abdominal wall cellulitis 10/13/2015  . Anemia, iron deficiency 05/08/2015  . Deficiency in the vitamin folic acid 05/08/2015  . Compression fracture of lumbar vertebra (HCC) 05/08/2015  . Acute kidney injury  (HCC) 04/20/2015  . Compression fracture of L1 lumbar vertebra (HCC) 04/18/2015  . Acute blood loss anemia 04/18/2015  . Hypokalemia 04/18/2015  . Dehydration 04/18/2015  . Hematoma 04/17/2015  . Cellulitis 04/17/2015  . Benign hypertension 04/17/2015  . Diabetes mellitus (HCC) 04/17/2015  . Cystocele, midline 08/11/2014  . Abnormal presence of protein in urine 06/20/2014  . Type 2 diabetes mellitus with other diabetic kidney complication (HCC) 12/14/2013     Past Surgical History:  Procedure Laterality Date  . APPLICATION OF WOUND VAC  07/01/2016   Procedure: APPLICATION OF WOUND VAC;  Surgeon: Christena Flake, MD;  Location: [facility] ORS;  Service: Orthopedics;;  . BACK SURGERY  2012   kyphoplasty lower back  . COLONOSCOPY W/ POLYPECTOMY    . DEBRIDEMENT OF ABDOMINAL WALL ABSCESS N/A 10/16/2015   Procedure: I & D OF ABDOMINAL WALL ABSCESS;  Surgeon: Lattie Haw, MD;  Location: [facility] ORS;  Service: General;  Laterality: N/A;  . IRRIGATION AND DEBRIDEMENT SHOULDER Left 07/01/2016   Procedure: IRRIGATION AND DEBRIDEMENT SHOULDER;  Surgeon: Christena Flake, MD;  Location: [facility] ORS;  Service: Orthopedics;  Laterality: Left;  . KYPHOPLASTY N/A 04/19/2015   Procedure: KYPHOPLASTY L 1;  Surgeon: Kennedy Bucker, MD;  Location: [facility] ORS;  Service: Orthopedics;  Laterality: N/A;  . KYPHOPLASTY N/A 02/27/2016   Procedure: KYPHOPLASTY;  Surgeon: Kennedy Bucker, MD;  Location: [facility] ORS;  Service: Orthopedics;  Laterality: N/A;  . LUMBAR LAMINECTOMY/DECOMPRESSION MICRODISCECTOMY Right 11/26/2012  Procedure: LUMBAR LAMINECTOMY/DECOMPRESSION MICRODISCECTOMY 1 LEVEL;  Surgeon: Hewitt Shorts, MD;  Location: [facility];  Service: Neurosurgery;  Laterality: Right;  Lumbar five-sacral one laminotomy and microdiskectomy   . ORIF PATELLA Left 03/23/2015   Procedure: OPEN REDUCTION INTERNAL (ORIF) FIXATION PATELLA;  Surgeon: Kennedy Bucker, MD;  Location: [facility] ORS;  Service: Orthopedics;  Laterality: Left;  . REVERSE  SHOULDER ARTHROPLASTY Left 04/16/2016   Procedure: REVERSE SHOULDER ARTHROPLASTY;  Surgeon: Christena Flake, MD;  Location: [facility] ORS;  Service: Orthopedics;  Laterality: Left;     Prior to Admission medications   Medication Sig Start Date End Date Taking? Authorizing Provider  amLODipine (NORVASC) 10 MG tablet Take 10 mg by mouth daily.    Yes [provider]  aspirin-acetaminophen-caffeine (EXCEDRIN MIGRAINE) [phone] MG tablet Take 2 tablets by mouth every 8 (eight) hours as needed for headache.    Yes [provider]  atorvastatin (LIPITOR) 80 MG tablet Take 80 mg by mouth at bedtime.   Yes [provider]  diazepam (VALIUM) 5 MG tablet Take 0.5 tablets (2.5 mg total) by mouth every 8 (eight) hours as needed for muscle spasms. 11/29/16  Yes Sharman Cheek, MD  diphenhydrAMINE (BENADRYL) 25 mg capsule Take 25 mg by mouth every 6 (six) hours as needed for itching.   Yes [provider]  doxepin (SINEQUAN) 10 MG capsule Take 20 mg by mouth at bedtime. 10/29/16  Yes [provider]  glimepiride (AMARYL) 4 MG tablet Take 1 tablet (4 mg total) by mouth daily with breakfast. 04/21/15  Yes Curtis Sites III, MD  hydrochlorothiazide (MICROZIDE) 12.5 MG capsule Take 12.5 mg by mouth daily.    Yes [provider]  hydrOXYzine (ATARAX/VISTARIL) 25 MG tablet Take 25 mg by mouth 3 (three) times daily as needed.   Yes [provider]  Ibuprofen-Diphenhydramine Cit (IBUPROFEN PM) 200-38 MG TABS Take 2 tablets by mouth at bedtime as needed (for sleep).   Yes [provider]  lisinopril (PRINIVIL,ZESTRIL) 40 MG tablet Take 40 mg by mouth daily.    Yes [provider]  metFORMIN (GLUCOPHAGE) 1000 MG tablet Take 1,000 mg by mouth 2 (two) times daily with a meal.   Yes [provider]  metoprolol tartrate (LOPRESSOR) 25 MG tablet Take 25 mg by mouth 2 (two) times daily. 11/07/16  Yes [provider]  mometasone (ELOCON)  0.1 % cream Apply 1 application topically daily as needed (for itching).   Yes [provider]  oxyCODONE (OXY IR/ROXICODONE) 5 MG immediate release tablet Take 1 tablet (5 mg total) by mouth 2 (two) times daily as needed for breakthrough pain. 07/03/16  Yes Delfino Lovett, MD  potassium chloride (K-DUR,KLOR-CON) 10 MEQ tablet Take 1 tablet by mouth daily. 05/10/15  Yes [provider]  potassium chloride 20 MEQ/15ML (10%) SOLN Take 15 mLs by mouth 2 (two) times daily. 12/10/16  Yes [provider]  rOPINIRole (REQUIP) 0.25 MG tablet Take 1 tablet (0.25 mg total) by mouth at bedtime. Patient taking differently: Take 0.5-1 mg by mouth at bedtime.  04/21/15  Yes Curtis Sites III, MD  sitaGLIPtin (JANUVIA) 100 MG tablet Take 1 tablet by mouth daily. 09/05/15 12/15/16 Yes [provider]  traZODone (DESYREL) 50 MG tablet Take 0.5 tablets (25 mg total) by mouth at bedtime. Patient taking differently: Take 100 mg by mouth at bedtime.  10/18/15  Yes Enid Baas, MD  venlafaxine XR (EFFEXOR-XR) 75 MG 24 hr capsule Take 1 capsule by mouth  daily. 06/19/16  Yes [provider]  aspirin EC 81 MG tablet Take 1 tablet (81 mg total) by mouth daily. 12/15/16 12/15/17  Sharman CheekStafford, Captola Teschner, MD     Allergies Alendronate; Atorvastatin; and Formaldehyde   Family History  Problem Relation Age of Onset  . Stroke Mother   . Hypertension Mother   . Diabetes Father   . Breast cancer Neg Hx     Social History Social History  Substance Use Topics  . Smoking status: Never Smoker  . Smokeless tobacco: Never Used  . Alcohol use No    Review of Systems  Constitutional:   No fever or chills.  ENT:   No sore throat. No rhinorrhea. Cardiovascular:   Positive as above atypical chest pain. No syncope or dizziness. Respiratory:   No dyspnea or cough. Gastrointestinal:   Negative for abdominal pain, vomiting and diarrhea.  Musculoskeletal:   Chronic leg pain from restless  leg syndrome. All other systems reviewed and are negative except as documented above in ROS and HPI.  ____________________________________________   PHYSICAL EXAM:  VITAL SIGNS: ED Triage Vitals  Enc Vitals Group     BP 12/15/16 1705 130/69     Pulse Rate 12/15/16 1705 89     Resp 12/15/16 1705 (!) 24     Temp --      Temp src --      SpO2 12/15/16 1705 98 %     Weight 12/15/16 1515 120 lb (54.4 kg)     Height 12/15/16 1515 4' 11.5" (1.511 m)     Head Circumference --      Peak Flow --      Pain Score 12/15/16 1515 8     Pain Loc --      Pain Edu? --      Excl. in GC? --     Vital signs reviewed, nursing assessments reviewed.   Constitutional:   Alert and oriented. Well appearing and in no distress. Eyes:   No scleral icterus.  EOMI. No nystagmus. No conjunctival pallor. PERRL. ENT   Head:   Normocephalic and atraumatic.   Nose:   No congestion/rhinnorhea.    Mouth/Throat:   MMM, no pharyngeal erythema. No peritonsillar mass.    Neck:   No meningismus. Full ROM Hematological/Lymphatic/Immunilogical:   No cervical lymphadenopathy. Cardiovascular:   RRR. Symmetric bilateral radial and DP pulses.  No murmurs.  Respiratory:   Normal respiratory effort without tachypnea/retractions. Breath sounds are clear and equal bilaterally. No wheezes/rales/rhonchi. Gastrointestinal:   Soft and nontender. Non distended. There is no CVA tenderness.  No rebound, rigidity, or guarding. Genitourinary:   deferred Musculoskeletal:   Normal range of motion in all extremities. No joint effusions.  No lower extremity tenderness.  No edema. Neurologic:   Normal speech and language.  Motor grossly intact. No gross focal neurologic deficits are appreciated.  Skin:    Skin is warm, dry and intact. No rash noted.  No petechiae, purpura, or bullae.  ____________________________________________    LABS (pertinent positives/negatives) (all labs ordered are listed, but only abnormal  results are displayed) Labs Reviewed  BASIC METABOLIC PANEL - Abnormal; Notable for the following:       Result Value   Sodium 132 (*)    Chloride 95 (*)    Glucose, Bld 170 (*)    BUN 52 (*)    Creatinine, Ser 1.40 (*)    GFR calc non Af Amer 35 (*)    GFR calc Af Amer 41 (*)  All other components within normal limits  CBC - Abnormal; Notable for the following:    WBC 11.8 (*)    Hemoglobin 11.2 (*)    HCT 33.6 (*)    Platelets 498 (*)    All other components within normal limits  TROPONIN I - Abnormal; Notable for the following:    Troponin I 0.05 (*)    All other components within normal limits  TROPONIN I - Abnormal; Notable for the following:    Troponin I 0.06 (*)    All other components within normal limits   ____________________________________________   EKG  Interpreted by me Sinus rhythm rate of 91, normal axis intervals QRS ST segments and T waves  ____________________________________________    RADIOLOGY  Dg Chest 2 View  Result Date: 12/15/2016 CLINICAL DATA:  Chest pain since last night with some shortness-of-breath. EXAM: CHEST  2 VIEW COMPARISON:  12/04/2016 FINDINGS: Lungs are adequately inflated without focal consolidation or effusion. Cardiomediastinal silhouette is within normal. There is calcified plaque over the aortic arch. Left shoulder prosthesis unchanged. Lower thoracic compression fracture unchanged to slightly worse. Two adjacent previous kyphoplasties over the upper lumbar spine. IMPRESSION: No acute cardiopulmonary disease. Aortic atherosclerosis. Known compression fracture over the lower thoracic spine unchanged to slightly worse. Previous kyphoplasties lumbar spine. Electronically Signed   By: Elberta Fortis M.D.   On: 12/15/2016 15:44    ____________________________________________   PROCEDURES Procedures  ____________________________________________   INITIAL IMPRESSION / ASSESSMENT AND PLAN / ED COURSE  Pertinent labs &  imaging results that were available during my care of the patient were reviewed by me and considered in my medical decision making (see chart for details).  Patient well appearing no acute distress presents with atypical chest pain in the setting of diffuse myalgias related to her chronic pain. The chest pain appears to mostly be part of her overall chronic pain syndrome and I have low suspicion for ACS PE dissection carditis pneumonia or pneumothorax. Patient is not septic. Troponin level is slightly abnormal, but delta troponin is not concerning especially in the setting of her noncardiac history of present illness. I will instruct her to take aspirin and follow up with primary care and cardiology for further evaluation.      ____________________________________________   FINAL CLINICAL IMPRESSION(S) / ED DIAGNOSES  Final diagnoses:  Body aches  Restless legs syndrome  Elevated troponin      New Prescriptions   ASPIRIN EC 81 MG TABLET    Take 1 tablet (81 mg total) by mouth daily.     Portions of this note were generated with dragon dictation software. Dictation errors may occur despite best attempts at proofreading.    Sharman Cheek, MD 12/15/16 1949

## 2016-12-15 NOTE — ED Triage Notes (Signed)
Chest pain since last night, arrived with husband. Pt did not take any aspirin prior to arrival. Pt reports central chest pain and some shortness of breath that started during the night.

## 2016-12-16 ENCOUNTER — Telehealth: Payer: Self-pay

## 2016-12-16 NOTE — Telephone Encounter (Signed)
Called number a few times, number is incorrect. Will send out letter  Pt was seen in ED for CP on 12/15/16 Needs a follow up appointment from this

## 2016-12-19 ENCOUNTER — Ambulatory Visit: Payer: Medicare Other | Admitting: Cardiovascular Disease

## 2017-01-30 ENCOUNTER — Emergency Department: Payer: Medicare Other

## 2017-01-30 ENCOUNTER — Emergency Department
Admission: EM | Admit: 2017-01-30 | Discharge: 2017-01-30 | Disposition: A | Discharge status: Skilled Nursing Facility | Payer: Medicare Other | Attending: Student in an Organized Health Care Education/Training Program | Admitting: Student in an Organized Health Care Education/Training Program

## 2017-01-30 ENCOUNTER — Encounter: Payer: Self-pay | Admitting: Emergency Medicine

## 2017-01-30 DIAGNOSIS — S0990XA Unspecified injury of head, initial encounter: Secondary | ICD-10-CM | POA: Diagnosis not present

## 2017-01-30 DIAGNOSIS — Z7982 Long term (current) use of aspirin: Secondary | ICD-10-CM | POA: Insufficient documentation

## 2017-01-30 DIAGNOSIS — Z79899 Other long term (current) drug therapy: Secondary | ICD-10-CM | POA: Diagnosis not present

## 2017-01-30 DIAGNOSIS — Z7984 Long term (current) use of oral hypoglycemic drugs: Secondary | ICD-10-CM | POA: Diagnosis not present

## 2017-01-30 DIAGNOSIS — I10 Essential (primary) hypertension: Secondary | ICD-10-CM | POA: Diagnosis not present

## 2017-01-30 DIAGNOSIS — Y9389 Activity, other specified: Secondary | ICD-10-CM | POA: Diagnosis not present

## 2017-01-30 DIAGNOSIS — W06XXXA Fall from bed, initial encounter: Secondary | ICD-10-CM | POA: Diagnosis not present

## 2017-01-30 DIAGNOSIS — Z8673 Personal history of transient ischemic attack (TIA), and cerebral infarction without residual deficits: Secondary | ICD-10-CM | POA: Insufficient documentation

## 2017-01-30 DIAGNOSIS — Z23 Encounter for immunization: Secondary | ICD-10-CM | POA: Diagnosis not present

## 2017-01-30 DIAGNOSIS — Y998 Other external cause status: Secondary | ICD-10-CM | POA: Insufficient documentation

## 2017-01-30 DIAGNOSIS — Y92013 Bedroom of single-family (private) house as the place of occurrence of the external cause: Secondary | ICD-10-CM | POA: Insufficient documentation

## 2017-01-30 DIAGNOSIS — E119 Type 2 diabetes mellitus without complications: Secondary | ICD-10-CM | POA: Insufficient documentation

## 2017-01-30 DIAGNOSIS — S0181XA Laceration without foreign body of other part of head, initial encounter: Secondary | ICD-10-CM | POA: Insufficient documentation

## 2017-01-30 MED ORDER — TETANUS-DIPHTH-ACELL PERTUSSIS 5-2.5-18.5 LF-MCG/0.5 IM SUSP
0.5000 mL | Freq: Once | INTRAMUSCULAR | Status: AC
  Administered 2017-01-30: 0.5 mL via INTRAMUSCULAR
  Filled 2017-01-30: qty 0.5

## 2017-01-30 NOTE — ED Notes (Signed)

## 2017-01-30 NOTE — ED Provider Notes (Signed)
[facility] Emergency Department Provider Note    First MD Initiated Contact with Patient 01/30/17 [phone]     (approximate)  I have reviewed the triage vital signs and the nursing notes.   HISTORY  Chief Complaint Fall and Laceration    HPI Cheryl Mueller is a 76 y.o. female presents to the ER after a fall out of bed. Patient remembers the fall and states that she admitted to the edge of the bed and lost her balance. Remembers hitting her head. No loss of consciousness.denies any neck pain. Has small cut to the top of her head which was hemostatic upon EMS arrival. She denies any chest pain or shortness of breath. No numbness or tingling. No neck pain.   Past Medical History:  Diagnosis Date  . Anxiety   . Arthritis   . Cervical myelopathy (HCC)   . Diabetes mellitus without complication (HCC)    Non Insulin dependant  . Headache    h/o migraines  . Hyperlipemia   . Hypertension   . Restless leg syndrome   . Stroke [facility]) 2002   mini stroke  . TIA (transient ischemic attack)    approx 15 years ago  . Vertigo    hx of  . White matter disease    Family History  Problem Relation Age of Onset  . Stroke Mother   . Hypertension Mother   . Diabetes Father   . Breast cancer Neg Hx    Past Surgical History:  Procedure Laterality Date  . APPLICATION OF WOUND VAC  07/01/2016   Procedure: APPLICATION OF WOUND VAC;  Surgeon: Christena Flake, MD;  Location: [facility] ORS;  Service: Orthopedics;;  . BACK SURGERY  2012   kyphoplasty lower back  . COLONOSCOPY W/ POLYPECTOMY    . DEBRIDEMENT OF ABDOMINAL WALL ABSCESS N/A 10/16/2015   Procedure: I & D OF ABDOMINAL WALL ABSCESS;  Surgeon: Lattie Haw, MD;  Location: [facility] ORS;  Service: General;  Laterality: N/A;  . IRRIGATION AND DEBRIDEMENT SHOULDER Left 07/01/2016   Procedure: IRRIGATION AND DEBRIDEMENT SHOULDER;  Surgeon: Christena Flake, MD;  Location: [facility] ORS;  Service: Orthopedics;  Laterality: Left;  .  KYPHOPLASTY N/A 04/19/2015   Procedure: KYPHOPLASTY L 1;  Surgeon: Kennedy Bucker, MD;  Location: [facility] ORS;  Service: Orthopedics;  Laterality: N/A;  . KYPHOPLASTY N/A 02/27/2016   Procedure: KYPHOPLASTY;  Surgeon: Kennedy Bucker, MD;  Location: [facility] ORS;  Service: Orthopedics;  Laterality: N/A;  . LUMBAR LAMINECTOMY/DECOMPRESSION MICRODISCECTOMY Right 11/26/2012   Procedure: LUMBAR LAMINECTOMY/DECOMPRESSION MICRODISCECTOMY 1 LEVEL;  Surgeon: Hewitt Shorts, MD;  Location: [facility];  Service: Neurosurgery;  Laterality: Right;  Lumbar five-sacral one laminotomy and microdiskectomy   . ORIF PATELLA Left 03/23/2015   Procedure: OPEN REDUCTION INTERNAL (ORIF) FIXATION PATELLA;  Surgeon: Kennedy Bucker, MD;  Location: [facility] ORS;  Service: Orthopedics;  Laterality: Left;  . REVERSE SHOULDER ARTHROPLASTY Left 04/16/2016   Procedure: REVERSE SHOULDER ARTHROPLASTY;  Surgeon: Christena Flake, MD;  Location: [facility] ORS;  Service: Orthopedics;  Laterality: Left;   Patient Active Problem List   Diagnosis Date Noted  . Anxiety and depression 05/11/2016  . 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 BP (high blood pressure) 10/20/2015  . HLD (hyperlipidemia) 10/20/2015  . Abdominal wall cellulitis 10/13/2015  . Anemia, iron deficiency 05/08/2015  . Deficiency in the vitamin folic acid 05/08/2015  . Compression fracture of lumbar vertebra (HCC) 05/08/2015  .  Acute kidney injury (HCC) 04/20/2015  . Compression fracture of L1 lumbar vertebra (HCC) 04/18/2015  . Acute blood loss anemia 04/18/2015  . Hypokalemia 04/18/2015  . Dehydration 04/18/2015  . Hematoma 04/17/2015  . Cellulitis 04/17/2015  . Benign hypertension 04/17/2015  . Diabetes mellitus (HCC) 04/17/2015  . Cystocele, midline 08/11/2014  . Abnormal presence of protein in urine 06/20/2014  . Type 2 diabetes mellitus with other diabetic kidney complication (HCC) 12/14/2013       Prior to Admission medications   Medication Sig Start Date End Date Taking? Authorizing Provider  amLODipine (NORVASC) 10 MG tablet Take 10 mg by mouth daily.     [provider]  aspirin EC 81 MG tablet Take 1 tablet (81 mg total) by mouth daily. 12/15/16 12/15/17  Sharman Cheek, MD  aspirin-acetaminophen-caffeine (EXCEDRIN MIGRAINE) [phone] MG tablet Take 2 tablets by mouth every 8 (eight) hours as needed for headache.     [provider]  atorvastatin (LIPITOR) 80 MG tablet Take 80 mg by mouth at bedtime.    [provider]  diazepam (VALIUM) 5 MG tablet Take 0.5 tablets (2.5 mg total) by mouth every 8 (eight) hours as needed for muscle spasms. 11/29/16   Sharman Cheek, MD  diphenhydrAMINE (BENADRYL) 25 mg capsule Take 25 mg by mouth every 6 (six) hours as needed for itching.    [provider]  doxepin (SINEQUAN) 10 MG capsule Take 20 mg by mouth at bedtime. 10/29/16   [provider]  glimepiride (AMARYL) 4 MG tablet Take 1 tablet (4 mg total) by mouth daily with breakfast. 04/21/15   Curtis Sites III, MD  hydrochlorothiazide (MICROZIDE) 12.5 MG capsule Take 12.5 mg by mouth daily.     [provider]  hydrOXYzine (ATARAX/VISTARIL) 25 MG tablet Take 25 mg by mouth 3 (three) times daily as needed.    [provider]  Ibuprofen-Diphenhydramine Cit (IBUPROFEN PM) 200-38 MG TABS Take 2 tablets by mouth at bedtime as needed (for sleep).    [provider]  lisinopril (PRINIVIL,ZESTRIL) 40 MG tablet Take 40 mg by mouth daily.     [provider]  metFORMIN (GLUCOPHAGE) 1000 MG tablet Take 1,000 mg by mouth 2 (two) times daily with a meal.    [provider]  metoprolol tartrate (LOPRESSOR) 25 MG tablet Take 25 mg by mouth 2 (two) times daily. 11/07/16   [provider]  mometasone (ELOCON) 0.1 % cream Apply 1 application topically daily as needed (for itching).    [provider]   oxyCODONE (OXY IR/ROXICODONE) 5 MG immediate release tablet Take 1 tablet (5 mg total) by mouth 2 (two) times daily as needed for breakthrough pain. 07/03/16   Delfino Lovett, MD  potassium chloride (K-DUR,KLOR-CON) 10 MEQ tablet Take 1 tablet by mouth daily. 05/10/15   [provider]  potassium chloride 20 MEQ/15ML (10%) SOLN Take 15 mLs by mouth 2 (two) times daily. 12/10/16   [provider]  rOPINIRole (REQUIP) 0.25 MG tablet Take 2-4 tablets (0.5-1 mg total) by mouth at bedtime. 12/15/16   Sharman Cheek, MD  sitaGLIPtin (JANUVIA) 100 MG tablet Take 1 tablet by mouth daily. 09/05/15 12/15/16  [provider]  traZODone (DESYREL) 50 MG tablet Take 0.5 tablets (25 mg total) by mouth at bedtime. Patient taking differently: Take 100 mg by mouth at bedtime.  10/18/15   Enid Baas, MD  venlafaxine XR (EFFEXOR-XR) 75 MG 24 hr capsule Take 1 capsule by mouth daily. 06/19/16   [provider]    Allergies Alendronate; Atorvastatin; and Formaldehyde    Social History Social History  Substance Use Topics  . Smoking status: Never Smoker  . Smokeless tobacco: Never Used  . Alcohol use No    Review of Systems Patient denies headaches, rhinorrhea, blurry vision, numbness, shortness of breath, chest pain, edema, cough, abdominal pain, nausea, vomiting, diarrhea, dysuria, fevers, rashes or hallucinations unless otherwise stated above in HPI. ____________________________________________   PHYSICAL EXAM:  VITAL SIGNS: Vitals:   01/30/17 0249  BP: 133/87  Pulse: 85  Resp: 18  Temp: 98.4 F (36.9 C)    Constitutional: Alert, Well appearing and in no acute distress. Eyes: Conjunctivae are normal.  Head: small contusion to frontal scalp with well approximated, hemoastatic 1cm laceration Nose: No congestion/rhinnorhea. Mouth/Throat: Mucous membranes are moist.   Neck: No stridor. Painless ROM.  Cardiovascular: Normal rate, regular rhythm. Grossly  normal heart sounds.  Good peripheral circulation. Respiratory: Normal respiratory effort.  No retractions. Lungs CTAB. Gastrointestinal: Soft and nontender. No distention. No abdominal bruits. No CVA tenderness. Musculoskeletal: No lower extremity tenderness nor edema.  No joint effusions. Neurologic:  Normal speech and language. No gross focal neurologic deficits are appreciated. No facial droop Skin:  Skin is warm, dry and intact. No rash noted. Psychiatric: Mood and affect are normal. Speech and behavior are normal.  ____________________________________________   LABS (all labs ordered are listed, but only abnormal results are displayed)  No results found for this or any previous visit (from the past 24 hour(s)). ____________________________________________ ____________________________________________  RADIOLOGY  I personally reviewed all radiographic images ordered to evaluate for the above acute complaints and reviewed radiology reports and findings.  These findings were personally discussed with the patient.  Please see medical record for radiology report.  ____________________________________________   PROCEDURES  Procedure(s) performed:  Procedures    Critical Care performed: no ____________________________________________   INITIAL IMPRESSION / ASSESSMENT AND PLAN / ED COURSE  Pertinent labs & imaging results that were available during my care of the patient were reviewed by me and considered in my medical decision making (see chart for details).  DDX: ich, sdh, laceration, fracture  Cheryl Mueller is a 76 y.o. who presents to the ED with fall out of bed as described above with small laceration the top of her scalp. CT imaging ordered to evaluate for acute intracranial abnormality shows no evidence of bleed. Neuro exam is reassuring.Remainder of secondary survey shows no evidence of traumatic injury. Laceration is well approximated without any bleeding. It remains  well approximated. Even with tension therefore do not feel it requires any sutures or staples. Tetanus was updated. Patient stable for discharge back to facility.      ____________________________________________   FINAL CLINICAL IMPRESSION(S) / ED DIAGNOSES  Final diagnoses:  Injury of head, initial encounter      NEW MEDICATIONS STARTED DURING THIS VISIT:  New Prescriptions   No medications on file     Note:  This document was prepared using Dragon voice recognition software and may include unintentional dictation errors.    Willy Eddyobinson, Veola Cafaro, MD 01/30/17 [phone]

## 2017-01-30 NOTE — ED Notes (Signed)
Staff [facility] unable to be contacted, no answer to all numbers listed. Director [facility](Shannon Worth) contacted and report given along with discharge instructions.

## 2017-01-30 NOTE — ED Triage Notes (Signed)
Pt arrived to the ED via EMS from [facility] for a laceration secondary to a fall. Pt states that she fell off of the bed because its a small bed and hit her head getting a laceration. Pt denies LOC and has a 2cm laceration to the top of the head. Pt is AOX3 in no apparent distress.

## 2017-01-30 NOTE — ED Notes (Signed)
Multiple attempts to contact [facility] without success. Spoke with staff in a different building who gave 2 numbers for [facility]: [phone] and [phone]; which no one answered on the calls that were made by this RN and Biomedical scientistCarlene Secretary. Staff notified by Charge RN to contact the other building and tell them to call ED back and speak with Charge and this RN since pt is being discharged and ambulance is on it's way to return pt back to [facility].

## 2017-01-30 NOTE — ED Notes (Signed)
Patient transported to CT 

## 2017-02-04 ENCOUNTER — Ambulatory Visit: Payer: Medicare Other | Admitting: Cardiovascular Disease

## 2017-09-18 IMAGING — CT CT HEAD W/O CM
3 of 4 series · 15 of 47 positions shown, 18 images · non-contrast
Comparison: None.

CLINICAL DATA: Fell out of bed, struck head. No loss of
consciousness.

EXAM:
CT HEAD WITHOUT CONTRAST
TECHNIQUE: Contiguous axial images were obtained from the base of the skull
through the vertex without intravenous contrast.

[Series 2: head wo · axial · 0.41mm/px · z∈[-87,+43]mm · 9 of 32 slices shown, 12 images]
[im 3/32  brain]
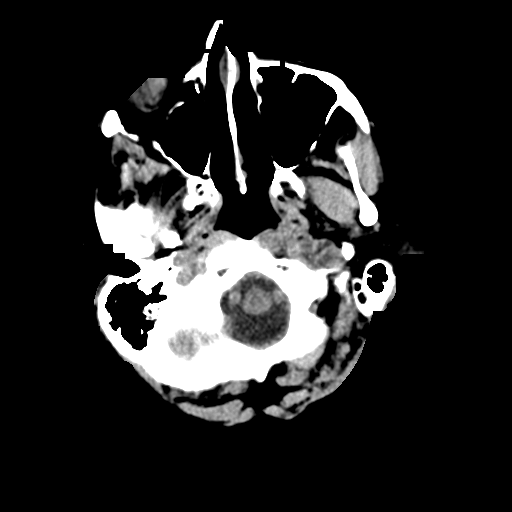
[im 3/32  bone]
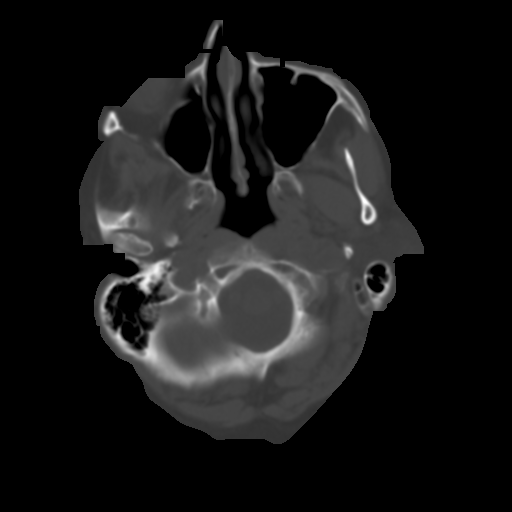
[im 7/32  brain]
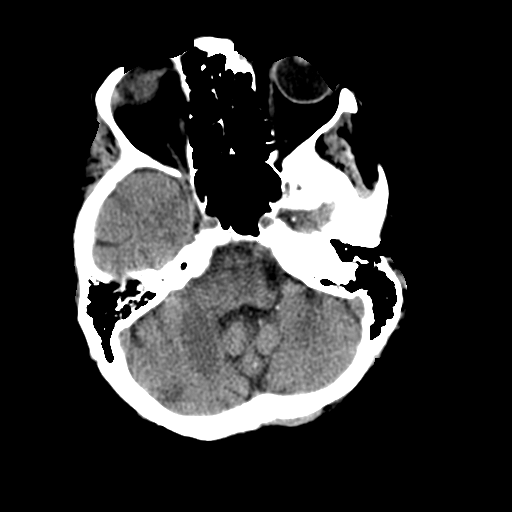
[im 9/32  brain]
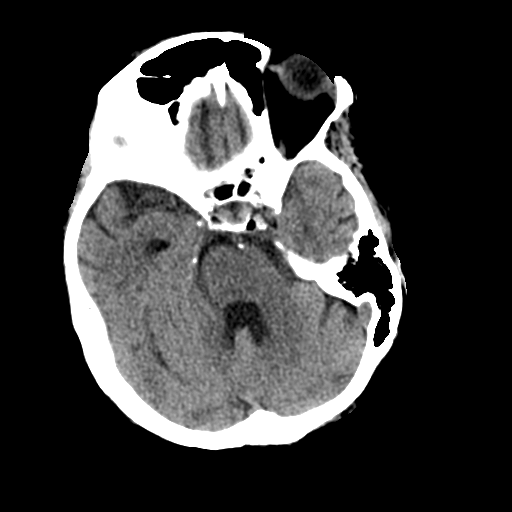
[im 14/32  brain]
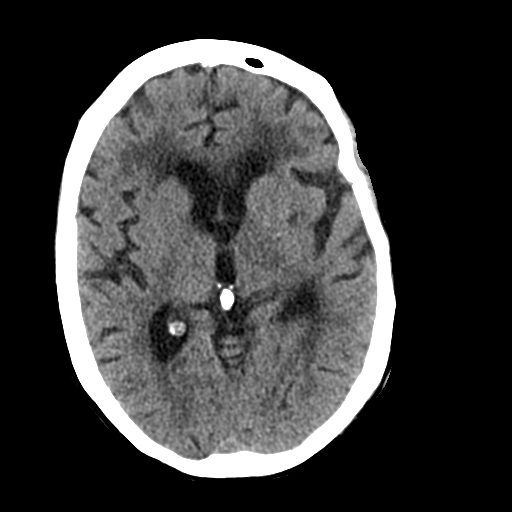
[im 16/32  brain]
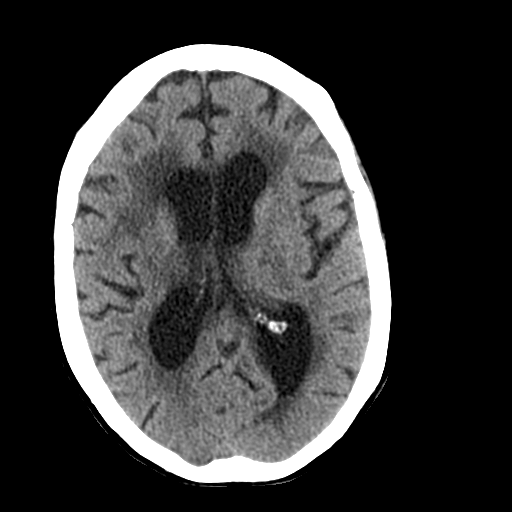
[im 16/32  bone]
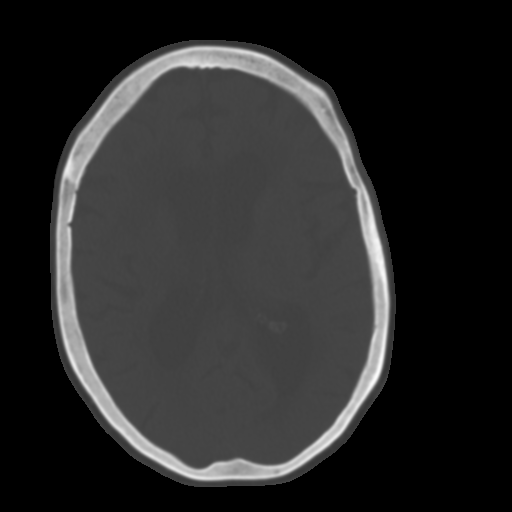
[im 18/32  brain]
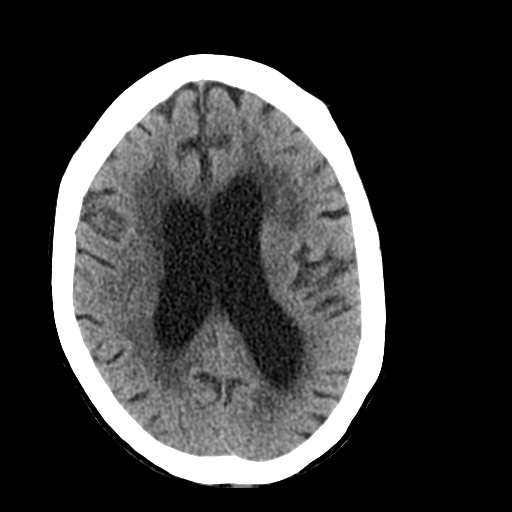
[im 23/32  brain]
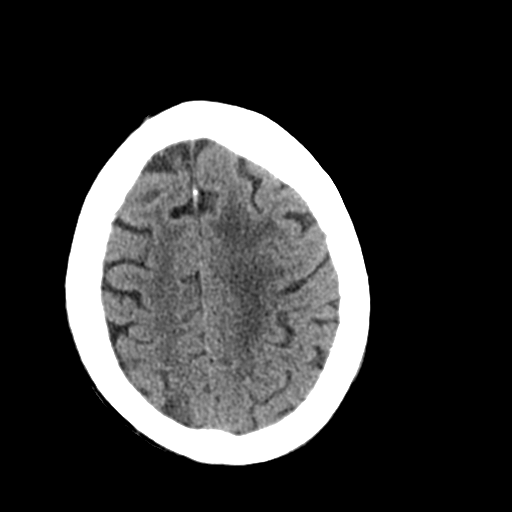
[im 25/32  brain]
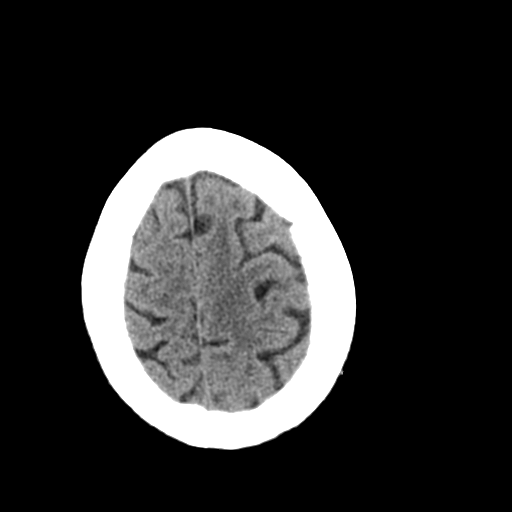
[im 29/32  brain]
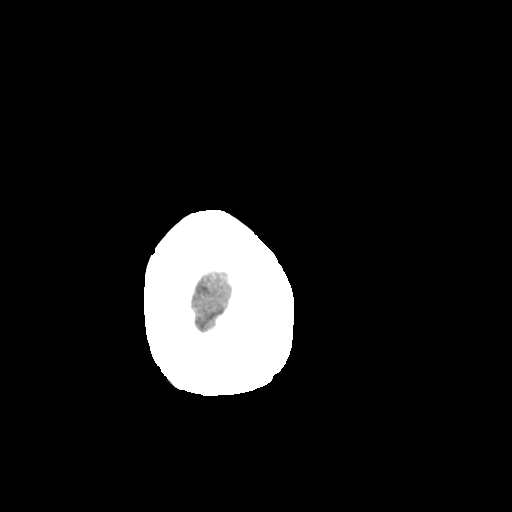
[im 29/32  bone]
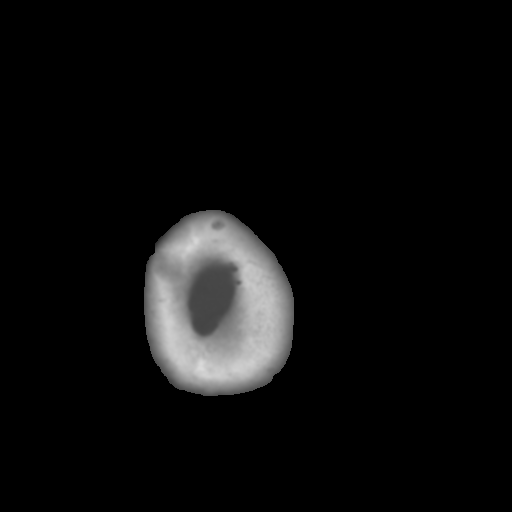

[Series 6: coronal soft tissue · coronal · 0.31mm/px · 3 of 63 slices shown]
[im 21/63  brain]
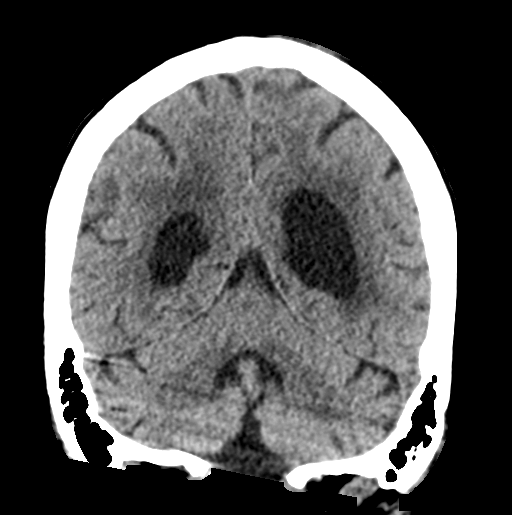
[im 28/63  brain]
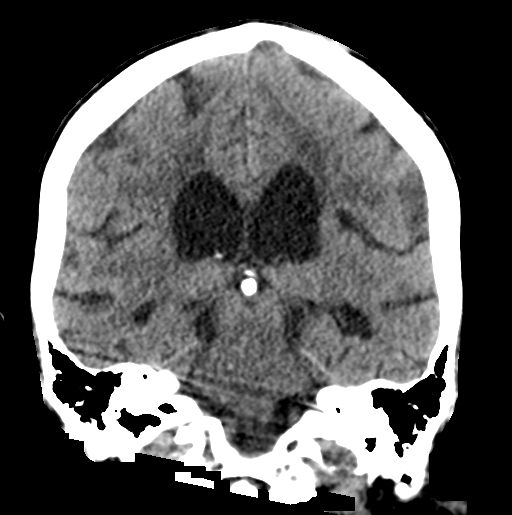
[im 35/63  brain]
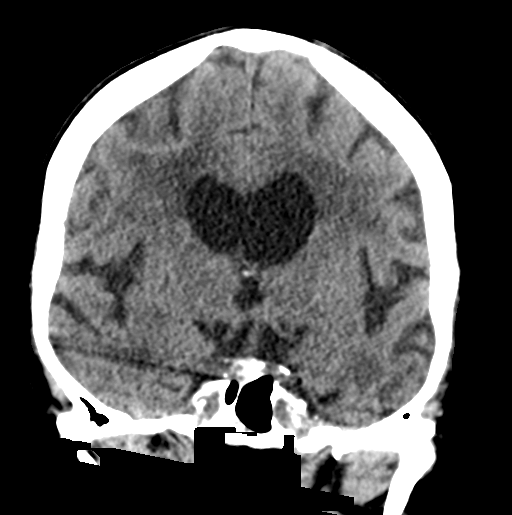

[Series 7: sagittal soft tissue · sagittal · 0.31mm/px · 3 of 47 slices shown]
[im 17/47  brain]
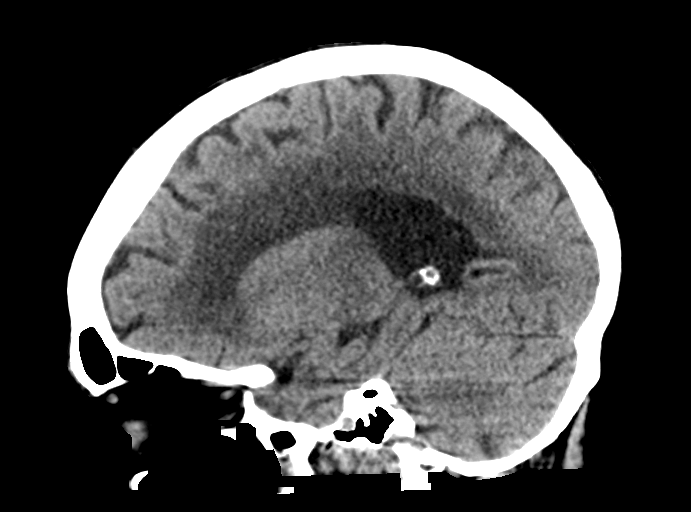
[im 24/47  brain]
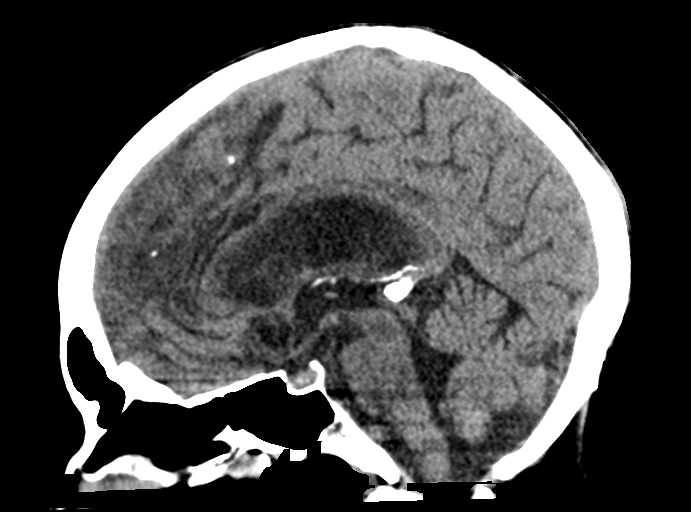
[im 30/47  brain]
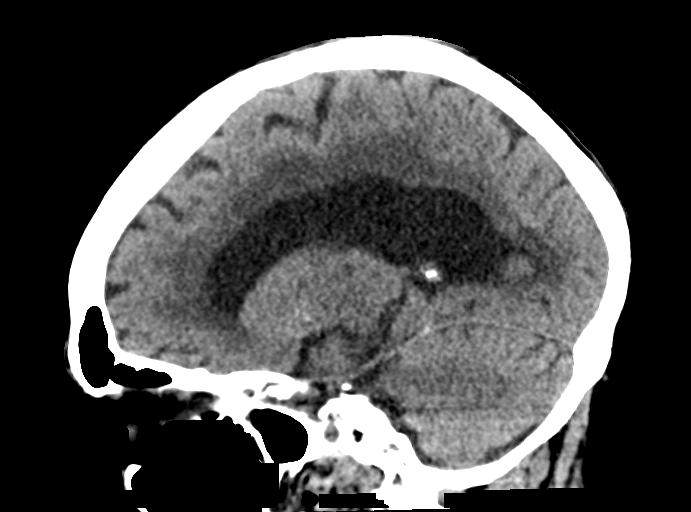

[15 of 47 positions shown; findings below may reference images not displayed]

FINDINGS: BRAIN: No intraparenchymal hemorrhage, mass effect nor midline
shift. The ventricles and sulci are normal for age. Confluent
supratentorial white matter hypodensities. Old small RIGHT
cerebellar infarct. LEFT inferior basal ganglia prominent
perivascular space. No acute large vascular territory infarcts. No
abnormal extra-axial fluid collections. Basal cisterns are patent.

VASCULAR: Moderate calcific atherosclerosis of the carotid siphons.

SKULL: No skull fracture. Small RIGHT frontal scalp hematoma without
subcutaneous gas or radiopaque foreign bodies.

SINUSES/ORBITS: The mastoid air-cells and included paranasal sinuses
are well-aerated.The included ocular globes and orbital contents are
non-suspicious.

OTHER: None.
IMPRESSION: 1. No acute intracranial process. Small RIGHT frontal scalp
hematoma. No skull fracture.
2. Moderate parenchymal brain volume loss.
3. Moderate to severe chronic small vessel ischemic disease. Old
small RIGHT cerebellar infarct.

## 2017-12-21 ENCOUNTER — Other Ambulatory Visit: Payer: Self-pay

## 2017-12-21 ENCOUNTER — Emergency Department
Admission: EM | Admit: 2017-12-21 | Discharge: 2017-12-21 | Disposition: A | Discharge status: Home / Self Care | Payer: Medicare Other | Attending: Emergency Medicine | Admitting: Emergency Medicine

## 2017-12-21 ENCOUNTER — Encounter: Payer: Self-pay | Admitting: Emergency Medicine

## 2017-12-21 ENCOUNTER — Emergency Department: Payer: Medicare Other

## 2017-12-21 DIAGNOSIS — Y999 Unspecified external cause status: Secondary | ICD-10-CM | POA: Insufficient documentation

## 2017-12-21 DIAGNOSIS — Z7984 Long term (current) use of oral hypoglycemic drugs: Secondary | ICD-10-CM | POA: Diagnosis not present

## 2017-12-21 DIAGNOSIS — I10 Essential (primary) hypertension: Secondary | ICD-10-CM | POA: Insufficient documentation

## 2017-12-21 DIAGNOSIS — Y92122 Bedroom in nursing home as the place of occurrence of the external cause: Secondary | ICD-10-CM | POA: Diagnosis not present

## 2017-12-21 DIAGNOSIS — S0990XA Unspecified injury of head, initial encounter: Secondary | ICD-10-CM | POA: Diagnosis present

## 2017-12-21 DIAGNOSIS — W06XXXA Fall from bed, initial encounter: Secondary | ICD-10-CM | POA: Diagnosis not present

## 2017-12-21 DIAGNOSIS — S0101XA Laceration without foreign body of scalp, initial encounter: Secondary | ICD-10-CM

## 2017-12-21 DIAGNOSIS — E1129 Type 2 diabetes mellitus with other diabetic kidney complication: Secondary | ICD-10-CM | POA: Diagnosis not present

## 2017-12-21 DIAGNOSIS — Z79899 Other long term (current) drug therapy: Secondary | ICD-10-CM | POA: Diagnosis not present

## 2017-12-21 DIAGNOSIS — Y9389 Activity, other specified: Secondary | ICD-10-CM | POA: Insufficient documentation

## 2017-12-21 DIAGNOSIS — W19XXXA Unspecified fall, initial encounter: Secondary | ICD-10-CM

## 2017-12-21 LAB — BASIC METABOLIC PANEL
ANION GAP: 8 (ref 5–15)
BUN: 26 mg/dL — ABNORMAL HIGH (ref 8–23)
CHLORIDE: 111 mmol/L (ref 98–111)
CO2: 21 mmol/L — ABNORMAL LOW (ref 22–32)
Calcium: 9 mg/dL (ref 8.9–10.3)
Creatinine, Ser: 1.03 mg/dL — ABNORMAL HIGH (ref 0.44–1.00)
GFR calc non Af Amer: 50 mL/min — ABNORMAL LOW (ref >60)
GFR, EST AFRICAN AMERICAN: 58 mL/min — AB (ref >60)
Glucose, Bld: 82 mg/dL (ref 70–99)
POTASSIUM: 5.5 mmol/L — AB (ref 3.5–5.1)
Sodium: 140 mmol/L (ref 135–145)

## 2017-12-21 LAB — URINALYSIS, COMPLETE (UACMP) WITH MICROSCOPIC
BILIRUBIN URINE: NEGATIVE
Bacteria, UA: NONE SEEN
Glucose, UA: NEGATIVE mg/dL
Hgb urine dipstick: NEGATIVE
KETONES UR: NEGATIVE mg/dL
LEUKOCYTES UA: NEGATIVE
NITRITE: NEGATIVE
PH: 7 (ref 5.0–8.0)
PROTEIN: NEGATIVE mg/dL
Specific Gravity, Urine: 1.006 (ref 1.005–1.030)
Squamous Epithelial / LPF: NONE SEEN (ref 0–5)

## 2017-12-21 LAB — CBC
HCT: 26.6 % — ABNORMAL LOW (ref 35.0–47.0)
HEMOGLOBIN: 8.7 g/dL — AB (ref 12.0–16.0)
MCH: 25.7 pg — ABNORMAL LOW (ref 26.0–34.0)
MCHC: 32.7 g/dL (ref 32.0–36.0)
MCV: 78.5 fL — AB (ref 80.0–100.0)
Platelets: 467 10*3/uL — ABNORMAL HIGH (ref 150–440)
RBC: 3.39 MIL/uL — ABNORMAL LOW (ref 3.80–5.20)
RDW: 16.2 % — ABNORMAL HIGH (ref 11.5–14.5)
WBC: 12.2 10*3/uL — AB (ref 3.6–11.0)

## 2017-12-21 LAB — TROPONIN I: Troponin I: <0.03 ng/mL (ref <0.03)

## 2017-12-21 MED ORDER — LIDOCAINE-EPINEPHRINE-TETRACAINE (LET) SOLUTION
3.0000 mL | Freq: Once | NASAL | Status: AC
  Administered 2017-12-21: 3 mL via TOPICAL
  Filled 2017-12-21: qty 3

## 2017-12-21 MED ORDER — ACETAMINOPHEN 325 MG PO TABS
650.0000 mg | ORAL_TABLET | Freq: Once | ORAL | Status: AC
  Administered 2017-12-21: 650 mg via ORAL
  Filled 2017-12-21: qty 2

## 2017-12-21 NOTE — Discharge Instructions (Addendum)
Please follow-up with your primary care physician for further evaluation of your symptoms.  Please return with any worsening symptoms.  Please have your staples removed in 1 week.

## 2017-12-21 NOTE — ED Triage Notes (Signed)
EMS pt to Rm 24 from [facility] with report of fall with laceration to back of her head. Pt is disoriented to date but oriented to place and self. Thinks Cheryl Mueller is the president.

## 2017-12-21 NOTE — ED Provider Notes (Signed)
[facility] Emergency Department Provider Note   ____________________________________________   First MD Initiated Contact with Patient 12/21/17 0101     (approximate)  I have reviewed the triage vital signs and the nursing notes.   HISTORY  Chief Complaint Fall    HPI Cheryl Mueller is a 77 y.o. female who comes into the hospital today with an unwitnessed fall at her nursing home.  The patient lives in assisted living.  The patient states that the last thing she remembers was that she was having a nightmare and she fell out of bed.  Staff did not see the patient fall but they came into the room right after and found her on the floor.  The patient hit her head on the nightstand.  EMS states that when they arrived the patient's blood sugar was 41.  The patient was given 12.5 of D50 by EMS.  She does not remember falling out of bed.  She states that her legs hurt and ache like a toothache and rates her pain a 5 out of 10 in intensity.  The patient reports though that she is had this pain all day especially before the fall.  She is here today for evaluation.  The patient does have a laceration of the top of her head.  The patient denies any other pains.   Past Medical History:  Diagnosis Date  . Anxiety   . Arthritis   . Cervical myelopathy (HCC)   . Diabetes mellitus without complication (HCC)    Non Insulin dependant  . Headache    h/o migraines  . Hyperlipemia   . Hypertension   . Restless leg syndrome   . Stroke [facility]) 2002   mini stroke  . TIA (transient ischemic attack)    approx 15 years ago  . Vertigo    hx of  . White matter disease     Patient Active Problem List   Diagnosis Date Noted  . Anxiety and depression 05/11/2016  . 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 BP (high blood pressure) 10/20/2015  . HLD (hyperlipidemia) 10/20/2015  . Abdominal wall cellulitis  10/13/2015  . Anemia, iron deficiency 05/08/2015  . Deficiency in the vitamin folic acid 05/08/2015  . Compression fracture of lumbar vertebra (HCC) 05/08/2015  . Acute kidney injury (HCC) 04/20/2015  . Compression fracture of L1 lumbar vertebra (HCC) 04/18/2015  . Acute blood loss anemia 04/18/2015  . Hypokalemia 04/18/2015  . Dehydration 04/18/2015  . Hematoma 04/17/2015  . Cellulitis 04/17/2015  . Benign hypertension 04/17/2015  . Diabetes mellitus (HCC) 04/17/2015  . Cystocele, midline 08/11/2014  . Abnormal presence of protein in urine 06/20/2014  . Type 2 diabetes mellitus with other diabetic kidney complication (HCC) 12/14/2013    Past Surgical History:  Procedure Laterality Date  . APPLICATION OF WOUND VAC  07/01/2016   Procedure: APPLICATION OF WOUND VAC;  Surgeon: Christena Flake, MD;  Location: [facility] ORS;  Service: Orthopedics;;  . BACK SURGERY  2012   kyphoplasty lower back  . COLONOSCOPY W/ POLYPECTOMY    . DEBRIDEMENT OF ABDOMINAL WALL ABSCESS N/A 10/16/2015   Procedure: I & D OF ABDOMINAL WALL ABSCESS;  Surgeon: Lattie Haw, MD;  Location: [facility] ORS;  Service: General;  Laterality: N/A;  . IRRIGATION AND DEBRIDEMENT SHOULDER Left 07/01/2016   Procedure: IRRIGATION AND DEBRIDEMENT SHOULDER;  Surgeon: Christena Flake, MD;  Location: [facility] ORS;  Service: Orthopedics;  Laterality:  Left;  . KYPHOPLASTY N/A 04/19/2015   Procedure: KYPHOPLASTY L 1;  Surgeon: Kennedy Bucker, MD;  Location: [facility] ORS;  Service: Orthopedics;  Laterality: N/A;  . KYPHOPLASTY N/A 02/27/2016   Procedure: KYPHOPLASTY;  Surgeon: Kennedy Bucker, MD;  Location: [facility] ORS;  Service: Orthopedics;  Laterality: N/A;  . LUMBAR LAMINECTOMY/DECOMPRESSION MICRODISCECTOMY Right 11/26/2012   Procedure: LUMBAR LAMINECTOMY/DECOMPRESSION MICRODISCECTOMY 1 LEVEL;  Surgeon: Hewitt Shorts, MD;  Location: [facility];  Service: Neurosurgery;  Laterality: Right;  Lumbar five-sacral one laminotomy and microdiskectomy   . ORIF  PATELLA Left 03/23/2015   Procedure: OPEN REDUCTION INTERNAL (ORIF) FIXATION PATELLA;  Surgeon: Kennedy Bucker, MD;  Location: [facility] ORS;  Service: Orthopedics;  Laterality: Left;  . REVERSE SHOULDER ARTHROPLASTY Left 04/16/2016   Procedure: REVERSE SHOULDER ARTHROPLASTY;  Surgeon: Christena Flake, MD;  Location: [facility] ORS;  Service: Orthopedics;  Laterality: Left;    Prior to Admission medications   Medication Sig Start Date End Date Taking? Authorizing Provider  amLODipine (NORVASC) 10 MG tablet Take 10 mg by mouth daily.     [provider]  aspirin-acetaminophen-caffeine (EXCEDRIN MIGRAINE) [phone] MG tablet Take 2 tablets by mouth every 8 (eight) hours as needed for headache.     [provider]  atorvastatin (LIPITOR) 80 MG tablet Take 80 mg by mouth at bedtime.    [provider]  diazepam (VALIUM) 5 MG tablet Take 0.5 tablets (2.5 mg total) by mouth every 8 (eight) hours as needed for muscle spasms. 11/29/16   Sharman Cheek, MD  diphenhydrAMINE (BENADRYL) 25 mg capsule Take 25 mg by mouth every 6 (six) hours as needed for itching.    [provider]  doxepin (SINEQUAN) 10 MG capsule Take 20 mg by mouth at bedtime. 10/29/16   [provider]  glimepiride (AMARYL) 4 MG tablet Take 1 tablet (4 mg total) by mouth daily with breakfast. 04/21/15   Curtis Sites III, MD  hydrochlorothiazide (MICROZIDE) 12.5 MG capsule Take 12.5 mg by mouth daily.     [provider]  hydrOXYzine (ATARAX/VISTARIL) 25 MG tablet Take 25 mg by mouth 3 (three) times daily as needed.    [provider]  Ibuprofen-Diphenhydramine Cit (IBUPROFEN PM) 200-38 MG TABS Take 2 tablets by mouth at bedtime as needed (for sleep).    [provider]  lisinopril (PRINIVIL,ZESTRIL) 40 MG tablet Take 40 mg by mouth daily.     [provider]  metFORMIN (GLUCOPHAGE) 1000 MG tablet Take 1,000 mg by mouth 2 (two) times daily with a meal.    [provider]  metoprolol tartrate (LOPRESSOR) 25 MG tablet Take 25 mg by mouth 2 (two) times daily. 11/07/16   [provider]  mometasone (ELOCON) 0.1 % cream Apply 1 application topically daily as needed (for itching).    [provider]  oxyCODONE (OXY IR/ROXICODONE) 5 MG immediate release tablet Take 1 tablet (5 mg total) by mouth 2 (two) times daily as needed for breakthrough pain. 07/03/16   Delfino Lovett, MD  potassium chloride (K-DUR,KLOR-CON) 10 MEQ tablet Take 1 tablet by mouth daily. 05/10/15   [provider]  potassium chloride 20 MEQ/15ML (10%) SOLN Take 15 mLs by mouth 2 (two) times daily. 12/10/16   [provider]  rOPINIRole (REQUIP) 0.25 MG tablet Take 2-4 tablets (0.5-1 mg total) by mouth at bedtime. 12/15/16   Sharman Cheek, MD  sitaGLIPtin (JANUVIA) 100 MG tablet Take 1 tablet by mouth daily. 09/05/15 12/15/16  [provider]  traZODone (DESYREL) 50 MG tablet Take 0.5 tablets (25 mg total) by mouth at bedtime. Patient taking differently: Take 100 mg by mouth at bedtime.  10/18/15   Enid Baas, MD  venlafaxine XR (EFFEXOR-XR) 75 MG 24 hr capsule Take 1 capsule by mouth daily. 06/19/16   [provider]    Allergies Alendronate; Atorvastatin; and Formaldehyde  Family History  Problem Relation Age of Onset  . Stroke Mother   . Hypertension Mother   . Diabetes Father   . Breast cancer Neg Hx     Social History Social History   Tobacco Use  . Smoking status: Never Smoker  . Smokeless tobacco: Never Used  Substance Use Topics  . Alcohol use: No    Alcohol/week: 0.0 oz  . Drug use: No    Review of Systems  Constitutional: No fever/chills Eyes: No visual changes. ENT: No sore throat. Cardiovascular: Denies chest pain. Respiratory: Denies shortness of breath. Gastrointestinal: No abdominal pain.  No nausea, no vomiting.  No diarrhea.  No constipation. Genitourinary: Negative for dysuria. Musculoskeletal:  bilateral leg pain Skin: laceration Neurological: Negative for headaches, focal weakness or numbness.   ____________________________________________   PHYSICAL EXAM:  VITAL SIGNS: ED Triage Vitals  Enc Vitals Group     BP 12/21/17 0111 (!) 147/75     Pulse Rate 12/21/17 0111 85     Resp 12/21/17 0111 17     Temp 12/21/17 0111 97.9 F (36.6 C)     Temp Source 12/21/17 0111 Oral     SpO2 12/21/17 0111 100 %     Weight 12/21/17 0112 125 lb (56.7 kg)     Height 12/21/17 0112 4\' 11"  (1.499 m)     Head Circumference --      Peak Flow --      Pain Score 12/21/17 0112 5     Pain Loc --      Pain Edu? --      Excl. in GC? --     Constitutional: Alert and oriented to self, she does not know the date or who is president.. Well appearing and in moderate distress. Eyes: Conjunctivae are normal. PERRL. EOMI. Head: Laceration to the patient's parietal and occipital scalp with blood to the back of her head. Nose: No congestion/rhinnorhea. Mouth/Throat: Mucous membranes are moist.  Oropharynx non-erythematous. Neck: No cervical spine tenderness to palpation Cardiovascular: Normal rate, regular rhythm. Systolic murmur.  Good peripheral circulation. Respiratory: Normal respiratory effort.  No retractions. Lungs CTAB. Gastrointestinal: Soft and nontender. No distention. Positive bowel sounds Musculoskeletal: No lower extremity tenderness nor edema.   Neurologic:  Normal speech and language.   Skin:  Skin is warm, dry and intact.  Erythema noted to the patient's anterior feet and thighs with some dry and scaly patches as well.  There is some mild warmth and it is blanching. Psychiatric: Mood and affect are normal. Speech and behavior are normal.  ____________________________________________   LABS (all labs ordered are listed, but only abnormal results are displayed)  Labs Reviewed  CBC - Abnormal; Notable for the following components:      Result Value   WBC 12.2 (*)    RBC 3.39 (*)     Hemoglobin 8.7 (*)    HCT 26.6 (*)    MCV 78.5 (*)    MCH 25.7 (*)    RDW 16.2 (*)    Platelets 467 (*)    All other components within normal limits  BASIC METABOLIC PANEL - Abnormal; Notable for the  following components:   Potassium 5.5 (*)    CO2 21 (*)    BUN 26 (*)    Creatinine, Ser 1.03 (*)    GFR calc non Af Amer 50 (*)    GFR calc Af Amer 58 (*)    All other components within normal limits  URINALYSIS, COMPLETE (UACMP) WITH MICROSCOPIC - Abnormal; Notable for the following components:   Color, Urine COLORLESS (*)    APPearance CLEAR (*)    All other components within normal limits  TROPONIN I   ____________________________________________  EKG  ED ECG REPORT I, Rebecka Apley, the attending physician, personally viewed and interpreted this ECG.   Date: 12/21/2017  EKG Time: 104  Rate: 81  Rhythm: normal sinus rhythm  Axis: normal  Intervals:none  ST&T Change: none  ____________________________________________  RADIOLOGY  ED MD interpretation:  CT head and cervical spine:No acute intracranial abnormality, Atrophy with chronic small vessel ischemia and chronic bilateral basal ganglia lacunar infarcts, no acute cervical spine fracture or static listhesis  Official radiology report(s): Ct Head Wo Contrast  Result Date: 12/21/2017 CLINICAL DATA:  Patient fell with laceration back of head. EXAM: CT HEAD WITHOUT CONTRAST CT CERVICAL SPINE WITHOUT CONTRAST TECHNIQUE: Multidetector CT imaging of the head and cervical spine was performed following the standard protocol without intravenous contrast. Multiplanar CT image reconstructions of the cervical spine were also generated. COMPARISON:  01/30/2017 FINDINGS: CT HEAD FINDINGS Brain: Chronic bilateral basal ganglial lacunar infarcts with moderate small vessel ischemia. No acute intracranial hemorrhage, midline shift or edema. No large vascular territory infarct. No intra-axial mass nor extra-axial fluid collections.  Midline fourth ventricle and basal cisterns without effacement. Vascular: No hyperdense vessel sign or unexpected calcifications. Moderate atherosclerosis of the carotid siphons bilaterally. Skull: No skull fracture. Mild scalp soft tissue swelling posteriorly overlying the right parietal skull. Sinuses/Orbits: Clear paranasal sinuses.  Intact orbits and globes. Other: Clear mastoids bilaterally. CT CERVICAL SPINE FINDINGS Alignment: Maintained cervical lordosis. Intact atlantodental interval and craniocervical relationship. Skull base and vertebrae: Cortical erosion of the odontoid just caudad to the atlantodental interval. Fracture or listhesis. Intact skull base. Soft tissues and spinal canal: Prevertebral soft tissue swelling. No visible canal hematoma. Disc levels: No significant central or foraminal stenosis. Facet arthropathy C6-7 and T1-T2. Upper chest: No dominant mass or consolidation.  No pneumothorax. Other: Extracranial carotid arteriosclerosis bilaterally. IMPRESSION: 1. No acute intracranial abnormality. Atrophy with chronic small vessel ischemia and chronic bilateral basal ganglial lacunar infarcts. 2. No acute cervical spine fracture or static listhesis. Electronically Signed   By: Tollie Eth M.D.   On: 12/21/2017 03:07   Ct Cervical Spine Wo Contrast  Result Date: 12/21/2017 CLINICAL DATA:  Patient fell with laceration back of head. EXAM: CT HEAD WITHOUT CONTRAST CT CERVICAL SPINE WITHOUT CONTRAST TECHNIQUE: Multidetector CT imaging of the head and cervical spine was performed following the standard protocol without intravenous contrast. Multiplanar CT image reconstructions of the cervical spine were also generated. COMPARISON:  01/30/2017 FINDINGS: CT HEAD FINDINGS Brain: Chronic bilateral basal ganglial lacunar infarcts with moderate small vessel ischemia. No acute intracranial hemorrhage, midline shift or edema. No large vascular territory infarct. No intra-axial mass nor extra-axial fluid  collections. Midline fourth ventricle and basal cisterns without effacement. Vascular: No hyperdense vessel sign or unexpected calcifications. Moderate atherosclerosis of the carotid siphons bilaterally. Skull: No skull fracture. Mild scalp soft tissue swelling posteriorly overlying the right parietal skull. Sinuses/Orbits: Clear paranasal sinuses.  Intact orbits and globes. Other: Clear mastoids bilaterally.  CT CERVICAL SPINE FINDINGS Alignment: Maintained cervical lordosis. Intact atlantodental interval and craniocervical relationship. Skull base and vertebrae: Cortical erosion of the odontoid just caudad to the atlantodental interval. Fracture or listhesis. Intact skull base. Soft tissues and spinal canal: Prevertebral soft tissue swelling. No visible canal hematoma. Disc levels: No significant central or foraminal stenosis. Facet arthropathy C6-7 and T1-T2. Upper chest: No dominant mass or consolidation.  No pneumothorax. Other: Extracranial carotid arteriosclerosis bilaterally. IMPRESSION: 1. No acute intracranial abnormality. Atrophy with chronic small vessel ischemia and chronic bilateral basal ganglial lacunar infarcts. 2. No acute cervical spine fracture or static listhesis. Electronically Signed   By: Tollie Eth M.D.   On: 12/21/2017 03:07    ____________________________________________   PROCEDURES  Procedure(s) performed: please, see procedure note(s).  Marland Kitchen.Laceration Repair Date/Time: 12/21/2017 3:00 AM Performed by: Rebecka Apley, MD Authorized by: Rebecka Apley, MD   Consent:    Consent obtained:  Verbal   Consent given by:  Patient   Risks discussed:  Infection, pain, retained foreign body, poor cosmetic result and poor wound healing Anesthesia (see MAR for exact dosages):    Anesthesia method:  Topical application   Topical anesthetic:  LET Laceration details:    Location:  Scalp   Scalp location:  L parietal   Length (cm):  3 Repair type:    Repair type:   Simple Exploration:    Hemostasis achieved with:  Direct pressure   Wound exploration: entire depth of wound probed and visualized     Contaminated: no   Treatment:    Area cleansed with:  Saline and Betadine   Amount of cleaning:  Standard   Irrigation solution:  Sterile saline   Visualized foreign bodies/material removed: no   Skin repair:    Repair method:  Staples   Number of staples:  3 Approximation:    Approximation:  Close Post-procedure details:    Dressing:  Sterile dressing and antibiotic ointment   Patient tolerance of procedure:  Tolerated well, no immediate complications    Critical Care performed: No  ____________________________________________   INITIAL IMPRESSION / ASSESSMENT AND PLAN / ED COURSE  As part of my medical decision making, I reviewed the following data within the electronic MEDICAL RECORD NUMBER Notes from prior ED visits and [place] Controlled Substance Database   This is a 77 year old female who comes into the hospital today with an unwitnessed fall.  The patient's blood sugar was 41 and she does have a rash to her bilateral legs.  She reports that she was out in the sun but the areas are scaly and slightly warm to touch.  I sent the patient for CT scan of her head and cervical spine.  I also ordered a CBC BMP and a troponin.  The patient has a slightly elevated white blood cell count of 12.2 and a hemoglobin of 8.7.  The patient had a hemoglobin of 7.9 in January of this year.  I will also order a urinalysis and await the results of the patient's CT scans.  The patient will receive some Tylenol for her leg pain and she will be reassessed.  The patient CT scan and urinalysis is unremarkable.  She will be discharged home and will follow up with her primary care physician.      ____________________________________________   FINAL CLINICAL IMPRESSION(S) / ED DIAGNOSES  Final diagnoses:  Fall, initial encounter  Injury of head, initial encounter   Laceration of scalp, initial encounter     ED Discharge  Orders    None       Note:  This document was prepared using Dragon voice recognition software and may include unintentional dictation errors.    Rebecka ApleyWebster, Tiarra Anastacio P, MD 12/21/17 [phone]

## 2017-12-21 NOTE — ED Notes (Signed)
Report called to Tammy, SIC at [facility] [place] [phone]).

## 2017-12-21 NOTE — ED Notes (Signed)
No esig as pt unable to sign due to dementia

## 2017-12-21 NOTE — ED Notes (Signed)
Pt returned to ED Rm 24 from CT at this time. 

## 2018-02-16 ENCOUNTER — Emergency Department
Admission: EM | Admit: 2018-02-16 | Discharge: 2018-02-16 | Disposition: A | Discharge status: Skilled Nursing Facility | Payer: Medicare Other | Attending: Emergency Medicine | Admitting: Emergency Medicine

## 2018-02-16 ENCOUNTER — Emergency Department: Payer: Medicare Other

## 2018-02-16 ENCOUNTER — Other Ambulatory Visit: Payer: Self-pay

## 2018-02-16 DIAGNOSIS — Z79899 Other long term (current) drug therapy: Secondary | ICD-10-CM | POA: Diagnosis not present

## 2018-02-16 DIAGNOSIS — Z7984 Long term (current) use of oral hypoglycemic drugs: Secondary | ICD-10-CM | POA: Diagnosis not present

## 2018-02-16 DIAGNOSIS — E11649 Type 2 diabetes mellitus with hypoglycemia without coma: Secondary | ICD-10-CM | POA: Insufficient documentation

## 2018-02-16 DIAGNOSIS — R748 Abnormal levels of other serum enzymes: Secondary | ICD-10-CM | POA: Diagnosis not present

## 2018-02-16 DIAGNOSIS — E162 Hypoglycemia, unspecified: Secondary | ICD-10-CM

## 2018-02-16 DIAGNOSIS — Z96612 Presence of left artificial shoulder joint: Secondary | ICD-10-CM | POA: Insufficient documentation

## 2018-02-16 DIAGNOSIS — R109 Unspecified abdominal pain: Secondary | ICD-10-CM | POA: Diagnosis not present

## 2018-02-16 DIAGNOSIS — I10 Essential (primary) hypertension: Secondary | ICD-10-CM | POA: Diagnosis not present

## 2018-02-16 DIAGNOSIS — R5383 Other fatigue: Secondary | ICD-10-CM | POA: Diagnosis present

## 2018-02-16 LAB — GLUCOSE, CAPILLARY
GLUCOSE-CAPILLARY: 171 mg/dL — AB (ref 70–99)
GLUCOSE-CAPILLARY: 154 mg/dL — AB (ref 70–99)
GLUCOSE-CAPILLARY: 172 mg/dL — AB (ref 70–99)
GLUCOSE-CAPILLARY: 99 mg/dL (ref 70–99)
Glucose-Capillary: 42 mg/dL — CL (ref 70–99)

## 2018-02-16 LAB — CBC WITH DIFFERENTIAL/PLATELET
BASOS ABS: 0.1 10*3/uL (ref 0–0.1)
BASOS PCT: 1 %
EOS ABS: 0.6 10*3/uL (ref 0–0.7)
EOS PCT: 6 %
HCT: 29.2 % — ABNORMAL LOW (ref 35.0–47.0)
Hemoglobin: 9.7 g/dL — ABNORMAL LOW (ref 12.0–16.0)
Lymphocytes Relative: 4 %
Lymphs Abs: 0.4 10*3/uL — ABNORMAL LOW (ref 1.0–3.6)
MCH: 27.3 pg (ref 26.0–34.0)
MCHC: 33.4 g/dL (ref 32.0–36.0)
MCV: 81.8 fL (ref 80.0–100.0)
MONO ABS: 0.8 10*3/uL (ref 0.2–0.9)
Monocytes Relative: 8 %
Neutro Abs: 8.1 10*3/uL — ABNORMAL HIGH (ref 1.4–6.5)
Neutrophils Relative %: 81 %
Platelets: 480 10*3/uL — ABNORMAL HIGH (ref 150–440)
RBC: 3.57 MIL/uL — AB (ref 3.80–5.20)
RDW: 19.3 % — AB (ref 11.5–14.5)
WBC: 10 10*3/uL (ref 3.6–11.0)

## 2018-02-16 LAB — URINALYSIS, COMPLETE (UACMP) WITH MICROSCOPIC
Bacteria, UA: NONE SEEN
Bilirubin Urine: NEGATIVE
Glucose, UA: 150 mg/dL — AB
HGB URINE DIPSTICK: NEGATIVE
KETONES UR: NEGATIVE mg/dL
Leukocytes, UA: NEGATIVE
NITRITE: NEGATIVE
PROTEIN: NEGATIVE mg/dL
Specific Gravity, Urine: 1.011 (ref 1.005–1.030)
Squamous Epithelial / LPF: NONE SEEN (ref 0–5)
pH: 6 (ref 5.0–8.0)

## 2018-02-16 LAB — COMPREHENSIVE METABOLIC PANEL
ALT: 14 U/L (ref 0–44)
AST: 23 U/L (ref 15–41)
Albumin: 4 g/dL (ref 3.5–5.0)
Alkaline Phosphatase: 98 U/L (ref 38–126)
Anion gap: 10 (ref 5–15)
BILIRUBIN TOTAL: 0.5 mg/dL (ref 0.3–1.2)
BUN: 15 mg/dL (ref 8–23)
CALCIUM: 9.3 mg/dL (ref 8.9–10.3)
CHLORIDE: 104 mmol/L (ref 98–111)
CO2: 25 mmol/L (ref 22–32)
CREATININE: 0.89 mg/dL (ref 0.44–1.00)
Glucose, Bld: 69 mg/dL — ABNORMAL LOW (ref 70–99)
Potassium: 4.2 mmol/L (ref 3.5–5.1)
Sodium: 139 mmol/L (ref 135–145)
TOTAL PROTEIN: 7.6 g/dL (ref 6.5–8.1)

## 2018-02-16 LAB — LIPASE, BLOOD: LIPASE: 322 U/L — AB (ref 11–51)

## 2018-02-16 LAB — TROPONIN I

## 2018-02-16 MED ORDER — DEXTROSE 50 % IV SOLN
1.0000 | Freq: Once | INTRAVENOUS | Status: AC
  Administered 2018-02-16: 50 mL via INTRAVENOUS

## 2018-02-16 MED ORDER — DEXTROSE 50 % IV SOLN
INTRAVENOUS | Status: AC
  Administered 2018-02-16: 50 mL via INTRAVENOUS
  Filled 2018-02-16: qty 50

## 2018-02-16 MED ORDER — SODIUM CHLORIDE 0.9 % IV BOLUS
1000.0000 mL | Freq: Once | INTRAVENOUS | Status: AC
  Administered 2018-02-16: 1000 mL via INTRAVENOUS

## 2018-02-16 MED ORDER — IOHEXOL 300 MG/ML  SOLN
75.0000 mL | Freq: Once | INTRAMUSCULAR | Status: AC | PRN
  Administered 2018-02-16: 75 mL via INTRAVENOUS

## 2018-02-16 NOTE — ED Triage Notes (Addendum)
Pt came to ED via EMS from [facility]. Per staff at [facility], pt had low blood sugar and high blood pressure. On EMS arrival pt blood sugar 71 and pressure 124/65. Pt denies pain but reports, "I feel funny" Upon arrival to ED pt blood sugar 42.

## 2018-02-16 NOTE — ED Provider Notes (Addendum)
[facility] Emergency Department Provider Note  ____________________________________________  Time seen: Approximately 6:09 PM  I have reviewed the triage vital signs and the nursing notes.   HISTORY  Chief Complaint Hypoglycemia     HPI Cheryl Mueller is a 77 y.o. female with a history of hypertension and diabetes who comes the ED today due to low blood sugar.  She is on 3 oral hypoglycemic medications which she has taken this morning as well.  She reports no acute symptoms denies pain.  She does states that she feels tired.  On arrival blood sugar level was 42.      Past Medical History:  Diagnosis Date  . Anxiety   . Arthritis   . Cervical myelopathy (HCC)   . Diabetes mellitus without complication (HCC)    Non Insulin dependant  . Headache    h/o migraines  . Hyperlipemia   . Hypertension   . Restless leg syndrome   . Stroke [facility]) 2002   mini stroke  . TIA (transient ischemic attack)    approx 15 years ago  . Vertigo    hx of  . White matter disease      Patient Active Problem List   Diagnosis Date Noted  . Anxiety and depression 05/11/2016  . 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 BP (high blood pressure) 10/20/2015  . HLD (hyperlipidemia) 10/20/2015  . Abdominal wall cellulitis 10/13/2015  . Anemia, iron deficiency 05/08/2015  . Deficiency in the vitamin folic acid 05/08/2015  . Compression fracture of lumbar vertebra (HCC) 05/08/2015  . Acute kidney injury (HCC) 04/20/2015  . Compression fracture of L1 lumbar vertebra (HCC) 04/18/2015  . Acute blood loss anemia 04/18/2015  . Hypokalemia 04/18/2015  . Dehydration 04/18/2015  . Hematoma 04/17/2015  . Cellulitis 04/17/2015  . Benign hypertension 04/17/2015  . Diabetes mellitus (HCC) 04/17/2015  . Cystocele, midline 08/11/2014  . Abnormal presence of protein in urine 06/20/2014  . Type 2 diabetes mellitus  with other diabetic kidney complication (HCC) 12/14/2013     Past Surgical History:  Procedure Laterality Date  . APPLICATION OF WOUND VAC  07/01/2016   Procedure: APPLICATION OF WOUND VAC;  Surgeon: Christena Flake, MD;  Location: [facility] ORS;  Service: Orthopedics;;  . BACK SURGERY  2012   kyphoplasty lower back  . COLONOSCOPY W/ POLYPECTOMY    . DEBRIDEMENT OF ABDOMINAL WALL ABSCESS N/A 10/16/2015   Procedure: I & D OF ABDOMINAL WALL ABSCESS;  Surgeon: Lattie Haw, MD;  Location: [facility] ORS;  Service: General;  Laterality: N/A;  . IRRIGATION AND DEBRIDEMENT SHOULDER Left 07/01/2016   Procedure: IRRIGATION AND DEBRIDEMENT SHOULDER;  Surgeon: Christena Flake, MD;  Location: [facility] ORS;  Service: Orthopedics;  Laterality: Left;  . KYPHOPLASTY N/A 04/19/2015   Procedure: KYPHOPLASTY L 1;  Surgeon: Kennedy Bucker, MD;  Location: [facility] ORS;  Service: Orthopedics;  Laterality: N/A;  . KYPHOPLASTY N/A 02/27/2016   Procedure: KYPHOPLASTY;  Surgeon: Kennedy Bucker, MD;  Location: [facility] ORS;  Service: Orthopedics;  Laterality: N/A;  . LUMBAR LAMINECTOMY/DECOMPRESSION MICRODISCECTOMY Right 11/26/2012   Procedure: LUMBAR LAMINECTOMY/DECOMPRESSION MICRODISCECTOMY 1 LEVEL;  Surgeon: Hewitt Shorts, MD;  Location: [facility];  Service: Neurosurgery;  Laterality: Right;  Lumbar five-sacral one laminotomy and microdiskectomy   . ORIF PATELLA Left 03/23/2015   Procedure: OPEN REDUCTION INTERNAL (ORIF) FIXATION PATELLA;  Surgeon: Kennedy Bucker, MD;  Location: [facility] ORS;  Service: Orthopedics;  Laterality: Left;  .  REVERSE SHOULDER ARTHROPLASTY Left 04/16/2016   Procedure: REVERSE SHOULDER ARTHROPLASTY;  Surgeon: Christena Flake, MD;  Location: [facility] ORS;  Service: Orthopedics;  Laterality: Left;     Prior to Admission medications   Medication Sig Start Date End Date Taking? Authorizing Provider  amLODipine (NORVASC) 10 MG tablet Take 10 mg by mouth daily.     [provider]  aspirin-acetaminophen-caffeine (EXCEDRIN  MIGRAINE) [phone] MG tablet Take 2 tablets by mouth every 8 (eight) hours as needed for headache.     [provider]  atorvastatin (LIPITOR) 80 MG tablet Take 80 mg by mouth at bedtime.    [provider]  diazepam (VALIUM) 5 MG tablet Take 0.5 tablets (2.5 mg total) by mouth every 8 (eight) hours as needed for muscle spasms. 11/29/16   Sharman Cheek, MD  diphenhydrAMINE (BENADRYL) 25 mg capsule Take 25 mg by mouth every 6 (six) hours as needed for itching.    [provider]  doxepin (SINEQUAN) 10 MG capsule Take 20 mg by mouth at bedtime. 10/29/16   [provider]  glimepiride (AMARYL) 4 MG tablet Take 1 tablet (4 mg total) by mouth daily with breakfast. 04/21/15   Curtis Sites III, MD  hydrochlorothiazide (MICROZIDE) 12.5 MG capsule Take 12.5 mg by mouth daily.     [provider]  hydrOXYzine (ATARAX/VISTARIL) 25 MG tablet Take 25 mg by mouth 3 (three) times daily as needed.    [provider]  Ibuprofen-Diphenhydramine Cit (IBUPROFEN PM) 200-38 MG TABS Take 2 tablets by mouth at bedtime as needed (for sleep).    [provider]  lisinopril (PRINIVIL,ZESTRIL) 40 MG tablet Take 40 mg by mouth daily.     [provider]  metFORMIN (GLUCOPHAGE) 1000 MG tablet Take 1,000 mg by mouth 2 (two) times daily with a meal.    [provider]  metoprolol tartrate (LOPRESSOR) 25 MG tablet Take 25 mg by mouth 2 (two) times daily. 11/07/16   [provider]  mometasone (ELOCON) 0.1 % cream Apply 1 application topically daily as needed (for itching).    [provider]  oxyCODONE (OXY IR/ROXICODONE) 5 MG immediate release tablet Take 1 tablet (5 mg total) by mouth 2 (two) times daily as needed for breakthrough pain. 07/03/16   Delfino Lovett, MD  potassium chloride (K-DUR,KLOR-CON) 10 MEQ tablet Take 1 tablet by mouth daily. 05/10/15   [provider]  potassium chloride 20 MEQ/15ML (10%) SOLN Take 15 mLs  by mouth 2 (two) times daily. 12/10/16   [provider]  rOPINIRole (REQUIP) 0.25 MG tablet Take 2-4 tablets (0.5-1 mg total) by mouth at bedtime. 12/15/16   Sharman Cheek, MD  sitaGLIPtin (JANUVIA) 100 MG tablet Take 1 tablet by mouth daily. 09/05/15 12/15/16  [provider]  traZODone (DESYREL) 50 MG tablet Take 0.5 tablets (25 mg total) by mouth at bedtime. Patient taking differently: Take 100 mg by mouth at bedtime.  10/18/15   Enid Baas, MD  venlafaxine XR (EFFEXOR-XR) 75 MG 24 hr capsule Take 1 capsule by mouth daily. 06/19/16   [provider]     Allergies Alendronate; Atorvastatin; and Formaldehyde   Family History  Problem Relation Age of Onset  . Stroke Mother   . Hypertension Mother   . Diabetes Father   . Breast cancer Neg Hx     Social History Social History   Tobacco Use  . Smoking status: Never Smoker  . Smokeless tobacco: Never Used  Substance Use Topics  .  Alcohol use: No    Alcohol/week: 0.0 standard drinks  . Drug use: No    Review of Systems  Constitutional:   No fever or chills.  ENT:   No sore throat. No rhinorrhea. Cardiovascular:   No chest pain or syncope. Respiratory:   No dyspnea or cough. Gastrointestinal:   Negative for abdominal pain, vomiting and diarrhea.  Musculoskeletal:   Negative for focal pain or swelling All other systems reviewed and are negative except as documented above in ROS and HPI.  ____________________________________________   PHYSICAL EXAM:  VITAL SIGNS: ED Triage Vitals  Enc Vitals Group     BP 02/16/18 1252 (!) 145/66     Pulse Rate 02/16/18 1252 61     Resp 02/16/18 1252 14     Temp 02/16/18 1505 (!) 95.3 F (35.2 C)     Temp src --      SpO2 02/16/18 1252 93 %     Weight 02/16/18 1254 123 lb 7.3 oz (56 kg)     Height 02/16/18 1254 5' (1.524 m)     Head Circumference --      Peak Flow --      Pain Score --      Pain Loc --      Pain Edu? --      Excl. in GC? --      Vital signs reviewed, nursing assessments reviewed.   Constitutional:   Alert and oriented. Non-toxic appearance. Eyes:   Conjunctivae are normal. EOMI. PERRL. ENT      Head:   Normocephalic and atraumatic.      Nose:   No congestion/rhinnorhea.       Mouth/Throat:   MMM, no pharyngeal erythema. No peritonsillar mass.       Neck:   No meningismus. Full ROM. Hematological/Lymphatic/Immunilogical:   No cervical lymphadenopathy. Cardiovascular:   RRR. Symmetric bilateral radial and DP pulses.  No murmurs. Cap refill less than 2 seconds. Respiratory:   Normal respiratory effort without tachypnea/retractions. Breath sounds are clear and equal bilaterally. No wheezes/rales/rhonchi. Gastrointestinal:   Soft and nontender. Non distended. There is no CVA tenderness.  No rebound, rigidity, or guarding.  Musculoskeletal:   Normal range of motion in all extremities. No joint effusions.  No lower extremity tenderness.  No edema. Neurologic:   Normal speech and language.  Motor grossly intact. No acute focal neurologic deficits are appreciated.  Skin:    Skin is warm, dry and intact. No rash noted.  No petechiae, purpura, or bullae.  ____________________________________________    LABS (pertinent positives/negatives) (all labs ordered are listed, but only abnormal results are displayed) Labs Reviewed  GLUCOSE, CAPILLARY - Abnormal; Notable for the following components:      Result Value   Glucose-Capillary 42 (*)    All other components within normal limits  GLUCOSE, CAPILLARY - Abnormal; Notable for the following components:   Glucose-Capillary 171 (*)    All other components within normal limits  URINALYSIS, COMPLETE (UACMP) WITH MICROSCOPIC - Abnormal; Notable for the following components:   Color, Urine YELLOW (*)    APPearance CLEAR (*)    Glucose, UA 150 (*)    All other components within normal limits  COMPREHENSIVE METABOLIC PANEL - Abnormal; Notable for the following  components:   Glucose, Bld 69 (*)    All other components within normal limits  LIPASE, BLOOD - Abnormal; Notable for the following components:   Lipase 322 (*)    All other components within normal limits  CBC WITH DIFFERENTIAL/PLATELET - Abnormal; Notable for the following components:   RBC 3.57 (*)    Hemoglobin 9.7 (*)    HCT 29.2 (*)    RDW 19.3 (*)    Platelets 480 (*)    Neutro Abs 8.1 (*)    Lymphs Abs 0.4 (*)    All other components within normal limits  GLUCOSE, CAPILLARY - Abnormal; Notable for the following components:   Glucose-Capillary 154 (*)    All other components within normal limits  GLUCOSE, CAPILLARY - Abnormal; Notable for the following components:   Glucose-Capillary 172 (*)    All other components within normal limits  URINE CULTURE  TROPONIN I  GLUCOSE, CAPILLARY  CBG MONITORING, ED   ____________________________________________   EKG  Interpreted by me Sinus rhythm rate of 61, normal axis intervals QRS ST segments and T waves.  ____________________________________________    RADIOLOGY  Ct Abdomen Pelvis W Contrast  Result Date: 02/16/2018 CLINICAL DATA:  Acute abdominal pain EXAM: CT ABDOMEN AND PELVIS WITH CONTRAST TECHNIQUE: Multidetector CT imaging of the abdomen and pelvis was performed using the standard protocol following bolus administration of intravenous contrast. CONTRAST:  75mL OMNIPAQUE IOHEXOL 300 MG/ML  SOLN COMPARISON:  10/13/2015 CT abdomen pelvis FINDINGS: LOWER CHEST: There is a small hiatal hernia that is increased in size from the prior study. HEPATOBILIARY: Normal hepatic contours and density. No intra- or extrahepatic biliary dilatation. Normal gallbladder. PANCREAS: Normal parenchymal contours without ductal dilatation. No peripancreatic fluid collection. SPLEEN: Normal. ADRENALS/URINARY TRACT: --Adrenal glands: Normal. --Right kidney/ureter: No hydronephrosis, nephroureterolithiasis, perinephric stranding or solid renal mass.  --Left kidney/ureter: No hydronephrosis, nephroureterolithiasis, perinephric stranding or solid renal mass. --Urinary bladder: Normal for degree of distention STOMACH/BOWEL: --Stomach/Duodenum: Small hiatal hernia, increased in size from the prior study. --Small bowel: No dilatation or inflammation. --Colon: No focal abnormality. --Appendix: Normal. VASCULAR/LYMPHATIC: Atherosclerotic calcification is present within the non-aneurysmal abdominal aorta, without hemodynamically significant stenosis. The portal vein, splenic vein, superior mesenteric vein and IVC are patent. No abdominal or pelvic lymphadenopathy. REPRODUCTIVE: Normal uterus and ovaries. MUSCULOSKELETAL. No bony spinal canal stenosis or focal osseous abnormality. OTHER: Remote L1-L2 vertebral augmentation. IMPRESSION: 1. Increased size of small hiatal hernia relative to the prior studies. 2. No acute abnormality of the abdomen or pelvis. 3.  Aortic Atherosclerosis (ICD10-I70.0). Electronically Signed   By: Deatra RobinsonKevin  Herman M.D.   On: 02/16/2018 16:49   Dg Chest Portable 1 View  Result Date: 02/16/2018 CLINICAL DATA:  Hypoglycemia, generalized weakness. EXAM: PORTABLE CHEST 1 VIEW COMPARISON:  Radiographs of December 15, 2016. FINDINGS: Stable cardiomediastinal silhouette. No pneumothorax or pleural effusion is noted. No acute pulmonary disease is noted. Status post left shoulder arthroplasty. IMPRESSION: No acute cardiopulmonary abnormality seen. Electronically Signed   By: Lupita RaiderJames  Green Jr, M.D.   On: 02/16/2018 14:06    ____________________________________________   PROCEDURES .Critical Care Performed by: Sharman CheekStafford, Jhanvi Drakeford, MD Authorized by: Sharman CheekStafford, Carizma Dunsworth, MD   Critical care provider statement:    Critical care time (minutes):  30   Critical care time was exclusive of:  Separately billable procedures and treating other patients   Critical care was necessary to treat or prevent imminent or life-threatening deterioration of the following  conditions:  Endocrine crisis   Critical care was time spent personally by me on the following activities:  Development of treatment plan with patient or surrogate, discussions with consultants, evaluation of patient's response to treatment, examination of patient, obtaining history from patient or surrogate, ordering and performing treatments and interventions, ordering  and review of laboratory studies, ordering and review of radiographic studies, pulse oximetry, re-evaluation of patient's condition and review of old charts    ____________________________________________  DIFFERENTIAL DIAGNOSIS   Pancreatitis, choledocholithiasis, medication side effect, urinary tract infection  CLINICAL IMPRESSION / ASSESSMENT AND PLAN / ED COURSE  Pertinent labs & imaging results that were available during my care of the patient were reviewed by me and considered in my medical decision making (see chart for details).    Patient presents with hypoglycemia.  No other acute symptoms.  Patient given an amp of D50 IV bolus for hypoglycemia.  Core temp slightly low but not severely abnormal given her age.  Likely related to the hypoglycemia.  Not septic on arrival.  Check labs.  Clinical Course as of Feb 17 1808  Mon Feb 16, 2018  1604 Elevated lipase and hypoglycemia concerning for pancreatitis.  Will obtai CT a/p to further eval for pancreatic mass vs choledocholithiasis   [PS]    Clinical Course User Index [PS] Sharman Cheek, MD     ----------------------------------------- 6:13 PM on 02/16/2018 -----------------------------------------  CT scan unremarkable.  No pancreatic or biliary pathology identifiable.  Abdomen remains nontender.  She is sitting upright, ate a lunch tray, states she feels fine and wants to go home.  Despite the elevated lipase her presentation is not clinically consistent with pancreatitis.  I think this is a medication side effect for which she can follow-up with her doctor  for further management.  ____________________________________________   FINAL CLINICAL IMPRESSION(S) / ED DIAGNOSES    Final diagnoses:  Hypoglycemia  Elevated lipase     ED Discharge Orders    None      Portions of this note were generated with dragon dictation software. Dictation errors may occur despite best attempts at proofreading.    Sharman Cheek, MD 02/16/18 1814    Sharman Cheek, MD 02/26/18 [phone]

## 2018-02-16 NOTE — ED Notes (Signed)
Pt alert  Eating dinner tray

## 2018-02-16 NOTE — ED Notes (Signed)
Resumed care from felicia rn.  Pt alert.  nsr on monitor.  Denies pain.  Iv in place.  Family with pt.   Skin warm and dry.

## 2018-02-16 NOTE — ED Notes (Signed)
Pt waiting on [facility] transport.   

## 2018-02-16 NOTE — ED Notes (Signed)
fsbs 99

## 2018-02-16 NOTE — ED Notes (Signed)
[facility]  CALLED  FOR  TRANSPORT 

## 2018-02-16 NOTE — ED Notes (Signed)
Re-checked pts blood sugar. Pt reports feels a little better but still feeling sleepy. Told pt we need a urine sample, pt reports does not need to go right now but will hit call button when she has to go.

## 2018-02-16 NOTE — Discharge Instructions (Addendum)
Your blood sugar was low when you arrived to the ER (42). Your other blood tests were okay except for an elevated lipase level.  A CT scan of the abdomen was unremarkable. Please follow up with your doctor for further monitoring of these symptoms.

## 2018-02-18 LAB — URINE CULTURE: Culture: NO GROWTH

## 2018-02-22 ENCOUNTER — Inpatient Hospital Stay
Admission: EM | Admit: 2018-02-22 | Discharge: 2018-02-23 | DRG: 638 | Disposition: A | Discharge status: Skilled Nursing Facility | Payer: Medicare Other | Attending: Internal Medicine | Admitting: Internal Medicine

## 2018-02-22 DIAGNOSIS — G959 Disease of spinal cord, unspecified: Secondary | ICD-10-CM | POA: Diagnosis not present

## 2018-02-22 DIAGNOSIS — E875 Hyperkalemia: Secondary | ICD-10-CM | POA: Diagnosis not present

## 2018-02-22 DIAGNOSIS — E11649 Type 2 diabetes mellitus with hypoglycemia without coma: Principal | ICD-10-CM | POA: Diagnosis present

## 2018-02-22 DIAGNOSIS — Z7984 Long term (current) use of oral hypoglycemic drugs: Secondary | ICD-10-CM | POA: Diagnosis not present

## 2018-02-22 DIAGNOSIS — I10 Essential (primary) hypertension: Secondary | ICD-10-CM | POA: Diagnosis present

## 2018-02-22 DIAGNOSIS — E162 Hypoglycemia, unspecified: Secondary | ICD-10-CM | POA: Diagnosis present

## 2018-02-22 DIAGNOSIS — Z888 Allergy status to other drugs, medicaments and biological substances status: Secondary | ICD-10-CM | POA: Diagnosis not present

## 2018-02-22 DIAGNOSIS — Z833 Family history of diabetes mellitus: Secondary | ICD-10-CM

## 2018-02-22 DIAGNOSIS — Z8673 Personal history of transient ischemic attack (TIA), and cerebral infarction without residual deficits: Secondary | ICD-10-CM | POA: Diagnosis not present

## 2018-02-22 DIAGNOSIS — E785 Hyperlipidemia, unspecified: Secondary | ICD-10-CM | POA: Diagnosis present

## 2018-02-22 DIAGNOSIS — Z79899 Other long term (current) drug therapy: Secondary | ICD-10-CM

## 2018-02-22 DIAGNOSIS — L0231 Cutaneous abscess of buttock: Secondary | ICD-10-CM | POA: Diagnosis present

## 2018-02-22 LAB — GLUCOSE, CAPILLARY
GLUCOSE-CAPILLARY: 135 mg/dL — AB (ref 70–99)
GLUCOSE-CAPILLARY: 147 mg/dL — AB (ref 70–99)
GLUCOSE-CAPILLARY: 258 mg/dL — AB (ref 70–99)
Glucose-Capillary: 72 mg/dL (ref 70–99)
Glucose-Capillary: 53 mg/dL — ABNORMAL LOW (ref 70–99)
Glucose-Capillary: 125 mg/dL — ABNORMAL HIGH (ref 70–99)

## 2018-02-22 LAB — CBC
HCT: 27.4 % — ABNORMAL LOW (ref 35.0–47.0)
HEMOGLOBIN: 9.4 g/dL — AB (ref 12.0–16.0)
MCH: 27.8 pg (ref 26.0–34.0)
MCHC: 34.2 g/dL (ref 32.0–36.0)
MCV: 81.2 fL (ref 80.0–100.0)
PLATELETS: 339 10*3/uL (ref 150–440)
RBC: 3.38 MIL/uL — AB (ref 3.80–5.20)
RDW: 19.8 % — ABNORMAL HIGH (ref 11.5–14.5)
WBC: 10.6 10*3/uL (ref 3.6–11.0)

## 2018-02-22 LAB — HEMOGLOBIN A1C
Hgb A1c MFr Bld: 6.4 % — ABNORMAL HIGH (ref 4.8–5.6)
MEAN PLASMA GLUCOSE: 136.98 mg/dL

## 2018-02-22 LAB — BASIC METABOLIC PANEL
Anion gap: 10 (ref 5–15)
BUN: 17 mg/dL (ref 8–23)
CHLORIDE: 104 mmol/L (ref 98–111)
CO2: 24 mmol/L (ref 22–32)
CREATININE: 1 mg/dL (ref 0.44–1.00)
Calcium: 9 mg/dL (ref 8.9–10.3)
GFR calc Af Amer: >60 mL/min (ref >60)
GFR calc non Af Amer: 52 mL/min — ABNORMAL LOW (ref >60)
GLUCOSE: 48 mg/dL — AB (ref 70–99)
POTASSIUM: 5.1 mmol/L (ref 3.5–5.1)
Sodium: 138 mmol/L (ref 135–145)

## 2018-02-22 MED ORDER — METOPROLOL SUCCINATE ER 50 MG PO TB24
25.0000 mg | ORAL_TABLET | Freq: Every day | ORAL | Status: DC
  Administered 2018-02-23: 25 mg via ORAL
  Filled 2018-02-22: qty 1

## 2018-02-22 MED ORDER — ONDANSETRON HCL 4 MG/2ML IJ SOLN
4.0000 mg | Freq: Once | INTRAMUSCULAR | Status: AC
  Administered 2018-02-22: 4 mg via INTRAVENOUS
  Filled 2018-02-22: qty 2

## 2018-02-22 MED ORDER — ENOXAPARIN SODIUM 40 MG/0.4ML ~~LOC~~ SOLN
40.0000 mg | SUBCUTANEOUS | Status: DC
  Administered 2018-02-22: 40 mg via SUBCUTANEOUS
  Filled 2018-02-22: qty 0.4

## 2018-02-22 MED ORDER — AMLODIPINE BESYLATE 5 MG PO TABS
10.0000 mg | ORAL_TABLET | Freq: Every morning | ORAL | Status: DC
  Administered 2018-02-23: 10 mg via ORAL
  Filled 2018-02-22: qty 2

## 2018-02-22 MED ORDER — OMEPRAZOLE MAGNESIUM 20 MG PO TBEC: 20.0000 mg | DELAYED_RELEASE_TABLET | Freq: Every day | ORAL | Status: DC

## 2018-02-22 MED ORDER — LIDOCAINE-EPINEPHRINE 2 %-1:100000 IJ SOLN
INTRAMUSCULAR | Status: AC
  Filled 2018-02-22: qty 1

## 2018-02-22 MED ORDER — FENTANYL CITRATE (PF) 100 MCG/2ML IJ SOLN
12.5000 ug | Freq: Once | INTRAMUSCULAR | Status: AC
  Administered 2018-02-22: 12.5 ug via INTRAVENOUS
  Filled 2018-02-22: qty 2

## 2018-02-22 MED ORDER — MEMANTINE HCL 5 MG PO TABS
10.0000 mg | ORAL_TABLET | Freq: Two times a day (BID) | ORAL | Status: DC
  Administered 2018-02-22 – 2018-02-23 (×2): 10 mg via ORAL
  Filled 2018-02-22 (×2): qty 2

## 2018-02-22 MED ORDER — TRIAMCINOLONE ACETONIDE 0.1 % EX CREA
1.0000 "application " | TOPICAL_CREAM | Freq: Two times a day (BID) | CUTANEOUS | Status: DC
  Administered 2018-02-22 – 2018-02-23 (×2): 1 via TOPICAL
  Filled 2018-02-22: qty 15

## 2018-02-22 MED ORDER — OCUVITE-LUTEIN PO CAPS
1.0000 | ORAL_CAPSULE | Freq: Two times a day (BID) | ORAL | Status: DC
  Administered 2018-02-23: 1 via ORAL
  Filled 2018-02-22 (×2): qty 1

## 2018-02-22 MED ORDER — OCTREOTIDE LOAD VIA INFUSION: 100.0000 ug | Freq: Once | INTRAVENOUS | Status: DC

## 2018-02-22 MED ORDER — MIRTAZAPINE 15 MG PO TABS
15.0000 mg | ORAL_TABLET | Freq: Every day | ORAL | Status: DC
  Administered 2018-02-22: 15 mg via ORAL
  Filled 2018-02-22: qty 1

## 2018-02-22 MED ORDER — DOXYCYCLINE HYCLATE 100 MG PO TABS
100.0000 mg | ORAL_TABLET | Freq: Two times a day (BID) | ORAL | Status: DC
  Administered 2018-02-22 – 2018-02-23 (×2): 100 mg via ORAL
  Filled 2018-02-22 (×2): qty 1

## 2018-02-22 MED ORDER — ACETAMINOPHEN 325 MG PO TABS: 650.0000 mg | ORAL_TABLET | Freq: Four times a day (QID) | ORAL | Status: DC | PRN

## 2018-02-22 MED ORDER — VENLAFAXINE HCL ER 75 MG PO CP24
75.0000 mg | ORAL_CAPSULE | Freq: Every day | ORAL | Status: DC
  Administered 2018-02-22 – 2018-02-23 (×2): 75 mg via ORAL
  Filled 2018-02-22 (×2): qty 1

## 2018-02-22 MED ORDER — DEXTROSE 10 % IV SOLN
INTRAVENOUS | Status: DC
  Administered 2018-02-22: 19:00:00 via INTRAVENOUS

## 2018-02-22 MED ORDER — ACETAMINOPHEN 650 MG RE SUPP: 650.0000 mg | Freq: Four times a day (QID) | RECTAL | Status: DC | PRN

## 2018-02-22 MED ORDER — PANTOPRAZOLE SODIUM 40 MG PO TBEC
40.0000 mg | DELAYED_RELEASE_TABLET | Freq: Every day | ORAL | Status: DC
  Administered 2018-02-23: 40 mg via ORAL
  Filled 2018-02-22: qty 1

## 2018-02-22 MED ORDER — DIPHENHYDRAMINE HCL 25 MG PO CAPS: 25.0000 mg | ORAL_CAPSULE | Freq: Three times a day (TID) | ORAL | Status: DC | PRN

## 2018-02-22 MED ORDER — ASPIRIN 81 MG PO CHEW
81.0000 mg | CHEWABLE_TABLET | Freq: Every day | ORAL | Status: DC
  Administered 2018-02-23: 81 mg via ORAL
  Filled 2018-02-22: qty 1

## 2018-02-22 MED ORDER — SODIUM CHLORIDE 0.9 % IV SOLN: 50.0000 ug/h | INTRAVENOUS | Status: DC

## 2018-02-22 MED ORDER — DOXYCYCLINE HYCLATE 100 MG PO TABS
100.0000 mg | ORAL_TABLET | Freq: Once | ORAL | Status: AC
  Administered 2018-02-22: 100 mg via ORAL
  Filled 2018-02-22: qty 1

## 2018-02-22 NOTE — Progress Notes (Signed)
Patient ID: Cheryl Mueller, female   DOB: [birth year], 77 y.o.   MRN: [id number]  ACP note  Patient and family at bedside  Diagnosis: Recurrent hypoglycemia with history of diabetes, abscess on the buttock, itching and rash, hyperkalemia, history of TIA, essential hypertension, history of cervical myelopathy  Patient wishes to be a full code.  Plan.  D10 drip for recurrent hypoglycemia with glimepiride.  Check fingersticks every 2 hours.  Doxycycline orally for boil on the buttock.    Time spent on ACP discussion 17 minutes  Dr. Alford Highland

## 2018-02-22 NOTE — ED Triage Notes (Signed)
Pt states she has a boil on the bottom, pt was sent her from the nurse at [facility]. Pt just went off antibiotics. Pt independent living, pt has visual hives on legs.

## 2018-02-22 NOTE — ED Notes (Signed)
Changed pt adult brief.

## 2018-02-22 NOTE — ED Notes (Signed)
Pt CBG 53. Both MD Pershing Pr[employer] and [employer] is aware. Pt given orange juice.

## 2018-02-22 NOTE — H&P (Signed)
[facility] -  at [facility]   PATIENT NAME: Cheryl Mueller    MR#:  [id number]  DATE OF BIRTH:  1941-06-23  DATE OF ADMISSION:  02/22/2018  PRIMARY CARE PHYSICIAN: Lynnea Ferrier, MD   REQUESTING/REFERRING PHYSICIAN: Dr Gladstone Pih  CHIEF COMPLAINT:   Chief Complaint  Patient presents with  . Recurrent Skin Infections    HISTORY OF PRESENT ILLNESS:  Cheryl Mueller  is a 77 y.o. female with a known history of came in with a boil on the buttocks which was lanced by ER physician.  The ER physician noticed that her sugar dropped down into the 50s.  Patient states that she is over the last few weeks had a sugar that has been dropping down into the 50s.  She takes glimepiride, Januvia and metformin.  A few times when her sugars fall down she feels a little bit strange but currently feels okay.  Hospitalist services were contacted for further evaluation.  PAST MEDICAL HISTORY:   Past Medical History:  Diagnosis Date  . Anxiety   . Arthritis   . Cervical myelopathy (HCC)   . Diabetes mellitus without complication (HCC)    Non Insulin dependant  . Headache    h/o migraines  . Hyperlipemia   . Hypertension   . Restless leg syndrome   . Stroke [facility]) 2002   mini stroke  . TIA (transient ischemic attack)    approx 15 years ago  . Vertigo    hx of  . White matter disease     PAST SURGICAL HISTORY:   Past Surgical History:  Procedure Laterality Date  . APPLICATION OF WOUND VAC  07/01/2016   Procedure: APPLICATION OF WOUND VAC;  Surgeon: Christena Flake, MD;  Location: [facility] ORS;  Service: Orthopedics;;  . BACK SURGERY  2012   kyphoplasty lower back  . COLONOSCOPY W/ POLYPECTOMY    . DEBRIDEMENT OF ABDOMINAL WALL ABSCESS N/A 10/16/2015   Procedure: I & D OF ABDOMINAL WALL ABSCESS;  Surgeon: Lattie Haw, MD;  Location: [facility] OR[facility];  Service: General;  Laterality: N/A;  . IRRIGATION AND DEBRIDEMENT SHOULDER Left 07/01/2016   Procedure: IRRIGATION  AND DEBRIDEMENT SHOULDER;  Surgeon: Christena Flake, MD;  Location: [facility];  Service: Orthopedics;  Laterality: Left;  . KYPHOPLASTY N/A 04/19/2015   Procedure: KYPHOPLASTY L 1;  Surgeon: Kennedy Bucker, MD;  Location: [facility] OR[facility];  Service: Orthopedics;  Laterality: N/A;  . KYPHOPLASTY N/A 02/27/2016   Procedure: KYPHOPLASTY;  Surgeon: Kennedy Bucker, MD;  Location: [facility];  Service: Orthopedics;  Laterality: N/A;  . LUMBAR LAMINECTOMY/DECOMPRESSION MICRODISCECTOMY Right 11/26/2012   Procedure: LUMBAR LAMINECTOMY/DECOMPRESSION MICRODISCECTOMY 1 LEVEL;  Surgeon: Hewitt Shorts, MD;  Location: [facility];  Service: Neurosurgery;  Laterality: Right;  Lumbar five-sacral one laminotomy and microdiskectomy   . ORIF PATELLA Left 03/23/2015   Procedure: OPEN REDUCTION INTERNAL (ORIF) FIXATION PATELLA;  Surgeon: Kennedy Bucker, MD;  Location: [facility];  Service: Orthopedics;  Laterality: Left;  . REVERSE SHOULDER ARTHROPLASTY Left 04/16/2016   Procedure: REVERSE SHOULDER ARTHROPLASTY;  Surgeon: Christena Flake, MD;  Location: [facility];  Service: Orthopedics;  Laterality: Left;    SOCIAL HISTORY:   Social History   Tobacco Use  . Smoking status: Never Smoker  . Smokeless tobacco: Never Used  Substance Use Topics  . Alcohol use: No    Alcohol/week: 0.0 standard drinks    FAMILY HISTORY:   Family History  Problem Relation Age of Onset  .  Stroke Mother   . Hypertension Mother   . Diabetes Father   . Breast cancer Neg Hx     DRUG ALLERGIES:   Allergies  Allergen Reactions  . Alendronate Other (See Comments)    Reaction:  Leg pain   . Nutritional Supplements     Anaphylactic   . Atorvastatin Rash  . Formaldehyde Rash    REVIEW OF SYSTEMS:  CONSTITUTIONAL: No fever, fatigue or weakness.  EYES: No blurred or double vision.  EARS, NOSE, AND THROAT: No tinnitus or ear pain. No sore throat.  Decreased hearing RESPIRATORY: No cough, shortness of breath, wheezing or hemoptysis.  CARDIOVASCULAR:  No chest pain, orthopnea, edema.  GASTROINTESTINAL: No nausea, vomiting, diarrhea or abdominal pain. No blood in bowel movements GENITOURINARY: No dysuria, hematuria.  ENDOCRINE: No polyuria, nocturia,  HEMATOLOGY: No anemia, easy bruising or bleeding SKIN: Positive for itching and rash on her arms and legs.  Boil on her left buttock with surrounding cellulitis MUSCULOSKELETAL: No joint pain or arthritis.   NEUROLOGIC: No tingling, numbness, weakness.  PSYCHIATRY: No anxiety or depression.   MEDICATIONS AT HOME:   Prior to Admission medications   Medication Sig Start Date End Date Taking? Authorizing Provider  amLODipine (NORVASC) 10 MG tablet Take 10 mg by mouth daily.     [provider]  aspirin-acetaminophen-caffeine (EXCEDRIN MIGRAINE) [phone] MG tablet Take 2 tablets by mouth every 8 (eight) hours as needed for headache.     [provider]  atorvastatin (LIPITOR) 80 MG tablet Take 80 mg by mouth at bedtime.    [provider]  diazepam (VALIUM) 5 MG tablet Take 0.5 tablets (2.5 mg total) by mouth every 8 (eight) hours as needed for muscle spasms. 11/29/16   Sharman Cheek, MD  diphenhydrAMINE (BENADRYL) 25 mg capsule Take 25 mg by mouth every 6 (six) hours as needed for itching.    [provider]  doxepin (SINEQUAN) 10 MG capsule Take 20 mg by mouth at bedtime. 10/29/16   [provider]  glimepiride (AMARYL) 4 MG tablet Take 1 tablet (4 mg total) by mouth daily with breakfast. 04/21/15   Curtis Sites III, MD  hydrochlorothiazide (MICROZIDE) 12.5 MG capsule Take 12.5 mg by mouth daily.     [provider]  hydrOXYzine (ATARAX/VISTARIL) 25 MG tablet Take 25 mg by mouth 3 (three) times daily as needed.    [provider]  Ibuprofen-Diphenhydramine Cit (IBUPROFEN PM) 200-38 MG TABS Take 2 tablets by mouth at bedtime as needed (for sleep).    [provider]  lisinopril (PRINIVIL,ZESTRIL) 40 MG tablet Take 40 mg by  mouth daily.     [provider]  metFORMIN (GLUCOPHAGE) 1000 MG tablet Take 1,000 mg by mouth 2 (two) times daily with a meal.    [provider]  metoprolol tartrate (LOPRESSOR) 25 MG tablet Take 25 mg by mouth 2 (two) times daily. 11/07/16   [provider]  mometasone (ELOCON) 0.1 % cream Apply 1 application topically daily as needed (for itching).    [provider]  oxyCODONE (OXY IR/ROXICODONE) 5 MG immediate release tablet Take 1 tablet (5 mg total) by mouth 2 (two) times daily as needed for breakthrough pain. 07/03/16   Delfino Lovett, MD  potassium chloride (K-DUR,KLOR-CON) 10 MEQ tablet Take 1 tablet by mouth daily. 05/10/15   [provider]  potassium chloride 20 MEQ/15ML (10%) SOLN Take 15 mLs by mouth 2 (two) times daily. 12/10/16   [provider]  rOPINIRole (  REQUIP) 0.25 MG tablet Take 2-4 tablets (0.5-1 mg total) by mouth at bedtime. 12/15/16   Sharman Cheek, MD  sitaGLIPtin (JANUVIA) 100 MG tablet Take 1 tablet by mouth daily. 09/05/15 12/15/16  [provider]  traZODone (DESYREL) 50 MG tablet Take 0.5 tablets (25 mg total) by mouth at bedtime. Patient taking differently: Take 100 mg by mouth at bedtime.  10/18/15   Enid Baas, MD  venlafaxine XR (EFFEXOR-XR) 75 MG 24 hr capsule Take 1 capsule by mouth daily. 06/19/16   [provider]   Medication reconciliation still undergoing.  VITAL SIGNS:  Blood pressure (!) 138/54, pulse 80, temperature 97.8 F (36.6 C), resp. rate 18, height 4\' 10"  (1.473 m), weight 56.7 kg, SpO2 100 %.  PHYSICAL EXAMINATION:  GENERAL:  77 y.o.-year-old patient lying in the bed with no acute distress.  EYES: Pupils equal, round, reactive to light and accommodation. No scleral icterus. Extraocular muscles intact.  HEENT: Head atraumatic, normocephalic. Oropharynx and nasopharynx clear.  NECK:  Supple, no jugular venous distention. No thyroid enlargement, no tenderness.   LUNGS: Normal breath sounds bilaterally, no wheezing, rales,rhonchi or crepitation. No use of accessory muscles of respiration.  CARDIOVASCULAR: S1, S2 normal. No murmurs, rubs, or gallops.  ABDOMEN: Soft, nontender, nondistended. Bowel sounds present. No organomegaly or mass.  EXTREMITIES: No pedal edema, cyanosis, or clubbing.  NEUROLOGIC: Cranial nerves II through XII are intact. Muscle strength 5/5 in all extremities. Sensation intact. Gait not checked.  PSYCHIATRIC: The patient is alert and oriented x 3.  SKIN: Erythema bilateral arms and legs.  Boil in the left buttock is lanced with surrounding erythema  LABORATORY PANEL:   CBC Recent Labs  Lab 02/22/18 1208  WBC 10.6  HGB 9.4*  HCT 27.4*  PLT 339   ------------------------------------------------------------------------------------------------------------------  Chemistries  Recent Labs  Lab 02/16/18 1301 02/22/18 1208  NA 139 138  K 4.2 5.1  CL 104 104  CO2 25 24  GLUCOSE 69* 48*  BUN 15 17  CREATININE 0.89 1.00  CALCIUM 9.3 9.0  AST 23  --   ALT 14  --   ALKPHOS 98  --   BILITOT 0.5  --    ------------------------------------------------------------------------------------------------------------------  Cardiac Enzymes Recent Labs  Lab 02/16/18 1301  TROPONINI <0.03   ------------------------------------------------------------------------------------------------------------------    IMPRESSION AND PLAN:   1.  Recurrent hypoglycemia with history of diabetes.  With sugar in the 50s.  Hold diabetic medications including glimepiride.  Add on hemoglobin A1c.  Fingersticks every 2 hours.  D10 drip for now. 2.  Abscess on the buttock.  Lanced by ER physician with packing.  Oral doxycycline prescribed. 3.  Itching and rash.  Could be medication related.  DC glimepiride.  Discontinue doxepin.  Hold lisinopril. 4.  Hyperkalemia.  Hold lisinopril for right now. 5.  History of TIA 6.  Essential hypertension.   Hold lisinopril for now. 7.  History of cervical myelopathy.  Physical therapy evaluation    All the records are reviewed and case discussed with ED provider. Management plans discussed with the patient, family and they are in agreement.  CODE STATUS: full code  TOTAL TIME TAKING CARE OF THIS PATIENT: 50 minutes, including acp time.    Alford Highland M.D on 02/22/2018 at 6:30 PM  Between 7am to 6pm - Pager - [phone]  After 6pm call admission pager [phone]  [facility]  [phone]  CC: Primary care physician; Lynnea Ferrier, MD

## 2018-02-22 NOTE — ED Provider Notes (Signed)
[facility] Emergency Department Provider Note ____________________________________________   First MD Initiated Contact with Patient 02/22/18 1501     (approximate)  I have reviewed the triage vital signs and the nursing notes.   HISTORY  Chief Complaint Recurrent Skin Infections  HPI Cheryl Mueller is a 77 y.o. female history of diabetes as well as recurrent skin infections was presenting to the emergency department today with a left buttock abscess.  She does not report fever.  Says that she skipped lunch today and was here recently in the emergency department for hypoglycemia.  She says that she just takes metformin 1000 mg twice daily but is not on any insulin.  Also takes glimepiride, 4 mg daily.  Past Medical History:  Diagnosis Date  . Anxiety   . Arthritis   . Cervical myelopathy (HCC)   . Diabetes mellitus without complication (HCC)    Non Insulin dependant  . Headache    h/o migraines  . Hyperlipemia   . Hypertension   . Restless leg syndrome   . Stroke [facility]) 2002   mini stroke  . TIA (transient ischemic attack)    approx 15 years ago  . Vertigo    hx of  . White matter disease     Patient Active Problem List   Diagnosis Date Noted  . Anxiety and depression 05/11/2016  . 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 BP (high blood pressure) 10/20/2015  . HLD (hyperlipidemia) 10/20/2015  . Abdominal wall cellulitis 10/13/2015  . Anemia, iron deficiency 05/08/2015  . Deficiency in the vitamin folic acid 05/08/2015  . Compression fracture of lumbar vertebra (HCC) 05/08/2015  . Acute kidney injury (HCC) 04/20/2015  . Compression fracture of L1 lumbar vertebra (HCC) 04/18/2015  . Acute blood loss anemia 04/18/2015  . Hypokalemia 04/18/2015  . Dehydration 04/18/2015  . Hematoma 04/17/2015  . Cellulitis 04/17/2015  . Benign hypertension 04/17/2015  . Diabetes  mellitus (HCC) 04/17/2015  . Cystocele, midline 08/11/2014  . Abnormal presence of protein in urine 06/20/2014  . Type 2 diabetes mellitus with other diabetic kidney complication (HCC) 12/14/2013    Past Surgical History:  Procedure Laterality Date  . APPLICATION OF WOUND VAC  07/01/2016   Procedure: APPLICATION OF WOUND VAC;  Surgeon: Christena Flake, MD;  Location: [facility];  Service: Orthopedics;;  . BACK SURGERY  2012   kyphoplasty lower back  . COLONOSCOPY W/ POLYPECTOMY    . DEBRIDEMENT OF ABDOMINAL WALL ABSCESS N/A 10/16/2015   Procedure: I & D OF ABDOMINAL WALL ABSCESS;  Surgeon: Lattie Haw, MD;  Location: [facility];  Service: General;  Laterality: N/A;  . IRRIGATION AND DEBRIDEMENT SHOULDER Left 07/01/2016   Procedure: IRRIGATION AND DEBRIDEMENT SHOULDER;  Surgeon: Christena Flake, MD;  Location: [facility];  Service: Orthopedics;  Laterality: Left;  . KYPHOPLASTY N/A 04/19/2015   Procedure: KYPHOPLASTY L 1;  Surgeon: Kennedy Bucker, MD;  Location: [facility];  Service: Orthopedics;  Laterality: N/A;  . KYPHOPLASTY N/A 02/27/2016   Procedure: KYPHOPLASTY;  Surgeon: Kennedy Bucker, MD;  Location: [facility];  Service: Orthopedics;  Laterality: N/A;  . LUMBAR LAMINECTOMY/DECOMPRESSION MICRODISCECTOMY Right 11/26/2012   Procedure: LUMBAR LAMINECTOMY/DECOMPRESSION MICRODISCECTOMY 1 LEVEL;  Surgeon: Hewitt Shorts, MD;  Location: [facility];  Service: Neurosurgery;  Laterality: Right;  Lumbar five-sacral one laminotomy and microdiskectomy   . ORIF PATELLA Left 03/23/2015   Procedure: OPEN REDUCTION INTERNAL (ORIF) FIXATION PATELLA;  Surgeon:  Kennedy Bucker, MD;  Location: [facility];  Service: Orthopedics;  Laterality: Left;  . REVERSE SHOULDER ARTHROPLASTY Left 04/16/2016   Procedure: REVERSE SHOULDER ARTHROPLASTY;  Surgeon: Christena Flake, MD;  Location: [facility];  Service: Orthopedics;  Laterality: Left;    Prior to Admission medications   Medication Sig Start Date End Date Taking? Authorizing  Provider  amLODipine (NORVASC) 10 MG tablet Take 10 mg by mouth daily.     [provider]  aspirin-acetaminophen-caffeine (EXCEDRIN MIGRAINE) [phone] MG tablet Take 2 tablets by mouth every 8 (eight) hours as needed for headache.     [provider]  atorvastatin (LIPITOR) 80 MG tablet Take 80 mg by mouth at bedtime.    [provider]  diazepam (VALIUM) 5 MG tablet Take 0.5 tablets (2.5 mg total) by mouth every 8 (eight) hours as needed for muscle spasms. 11/29/16   Sharman Cheek, MD  diphenhydrAMINE (BENADRYL) 25 mg capsule Take 25 mg by mouth every 6 (six) hours as needed for itching.    [provider]  doxepin (SINEQUAN) 10 MG capsule Take 20 mg by mouth at bedtime. 10/29/16   [provider]  glimepiride (AMARYL) 4 MG tablet Take 1 tablet (4 mg total) by mouth daily with breakfast. 04/21/15   Curtis Sites III, MD  hydrochlorothiazide (MICROZIDE) 12.5 MG capsule Take 12.5 mg by mouth daily.     [provider]  hydrOXYzine (ATARAX/VISTARIL) 25 MG tablet Take 25 mg by mouth 3 (three) times daily as needed.    [provider]  Ibuprofen-Diphenhydramine Cit (IBUPROFEN PM) 200-38 MG TABS Take 2 tablets by mouth at bedtime as needed (for sleep).    [provider]  lisinopril (PRINIVIL,ZESTRIL) 40 MG tablet Take 40 mg by mouth daily.     [provider]  metFORMIN (GLUCOPHAGE) 1000 MG tablet Take 1,000 mg by mouth 2 (two) times daily with a meal.    [provider]  metoprolol tartrate (LOPRESSOR) 25 MG tablet Take 25 mg by mouth 2 (two) times daily. 11/07/16   [provider]  mometasone (ELOCON) 0.1 % cream Apply 1 application topically daily as needed (for itching).    [provider]  oxyCODONE (OXY IR/ROXICODONE) 5 MG immediate release tablet Take 1 tablet (5 mg total) by mouth 2 (two) times daily as needed for breakthrough pain. 07/03/16   Delfino Lovett, MD  potassium chloride  (K-DUR,KLOR-CON) 10 MEQ tablet Take 1 tablet by mouth daily. 05/10/15   [provider]  potassium chloride 20 MEQ/15ML (10%) SOLN Take 15 mLs by mouth 2 (two) times daily. 12/10/16   [provider]  rOPINIRole (REQUIP) 0.25 MG tablet Take 2-4 tablets (0.5-1 mg total) by mouth at bedtime. 12/15/16   Sharman Cheek, MD  sitaGLIPtin (JANUVIA) 100 MG tablet Take 1 tablet by mouth daily. 09/05/15 12/15/16  [provider]  traZODone (DESYREL) 50 MG tablet Take 0.5 tablets (25 mg total) by mouth at bedtime. Patient taking differently: Take 100 mg by mouth at bedtime.  10/18/15   Enid Baas, MD  venlafaxine XR (EFFEXOR-XR) 75 MG 24 hr capsule Take 1 capsule by mouth daily. 06/19/16   [provider]    Allergies Alendronate; Nutritional supplements; Atorvastatin; and Formaldehyde  Family History  Problem Relation Age of Onset  . Stroke Mother   . Hypertension Mother   . Diabetes Father   . Breast cancer Neg Hx     Social History Social History   Tobacco Use  . Smoking  status: Never Smoker  . Smokeless tobacco: Never Used  Substance Use Topics  . Alcohol use: No    Alcohol/week: 0.0 standard drinks  . Drug use: No    Review of Systems  Constitutional: No fever/chills Eyes: No visual changes. ENT: No sore throat. Cardiovascular: Denies chest pain. Respiratory: Denies shortness of breath. Gastrointestinal: No abdominal pain.  No nausea, no vomiting.  No diarrhea.  No constipation. Genitourinary: Negative for dysuria. Musculoskeletal: Negative for back pain. Skin: As above Neurological: Negative for headaches, focal weakness or numbness.   ____________________________________________   PHYSICAL EXAM:  VITAL SIGNS: ED Triage Vitals  Enc Vitals Group     BP 02/22/18 1151 (!) 138/54     Pulse Rate 02/22/18 1151 80     Resp 02/22/18 1151 18     Temp 02/22/18 1151 97.8 F (36.6 C)     Temp Source 02/22/18 1151 Oral     SpO2  02/22/18 1151 100 %     Weight 02/22/18 1151 125 lb (56.7 kg)     Height 02/22/18 1151 4\' 10"  (1.473 m)     Head Circumference --      Peak Flow --      Pain Score 02/22/18 1156 0     Pain Loc --      Pain Edu? --      Excl. in GC? --     Constitutional: Alert and oriented. Well appearing and in no acute distress. Eyes: Conjunctivae are normal.  Head: Atraumatic. Nose: No congestion/rhinnorhea. Mouth/Throat: Mucous membranes are moist.  Neck: No stridor.   Cardiovascular: Normal rate, regular rhythm. Grossly normal heart sounds.   Respiratory: Normal respiratory effort.  No retractions. Lungs CTAB. Gastrointestinal: Soft and nontender. No distention.  Musculoskeletal: No lower extremity tenderness nor edema.  No joint effusions. Neurologic:  Normal speech and language. No gross focal neurologic deficits are appreciated. Skin:   Left buttock with 4 cm, circular abscess that appears to be draining pus from the region at the medial and superior aspect.  There is approximately a 3 cm wheal of induration surrounding the entire abscess head.  It is tender to palpation and erythematous.  Psychiatric: Mood and affect are normal. Speech and behavior are normal.  ____________________________________________   LABS (all labs ordered are listed, but only abnormal results are displayed)  Labs Reviewed  CBC - Abnormal; Notable for the following components:      Result Value   RBC 3.38 (*)    Hemoglobin 9.4 (*)    HCT 27.4 (*)    RDW 19.8 (*)    All other components within normal limits  BASIC METABOLIC PANEL - Abnormal; Notable for the following components:   Glucose, Bld 48 (*)    GFR calc non Af Amer 52 (*)    All other components within normal limits  GLUCOSE, CAPILLARY - Abnormal; Notable for the following components:   Glucose-Capillary 53 (*)    All other components within normal limits  GLUCOSE, CAPILLARY  CBG MONITORING, ED  CBG MONITORING, ED  CBG MONITORING, ED  CBG  MONITORING, ED  CBG MONITORING, ED  CBG MONITORING, ED   ____________________________________________  EKG   ____________________________________________  RADIOLOGY   ____________________________________________   PROCEDURES  Procedure(s) performed:    Marland KitchenMarland KitchenIncision and Drainage Date/Time: 02/22/2018 3:50 PM Performed by: Myrna Blazer, MD Authorized by: Myrna Blazer, MD   Consent:    Consent obtained:  Verbal   Consent given by:  Patient   Risks discussed:  Bleeding, infection, incomplete drainage and pain   Alternatives discussed:  Observation and no treatment Location:    Type:  Abscess   Size:  3cm   Location: left buttock. Pre-procedure details:    Skin preparation:  Antiseptic wash Anesthesia (see MAR for exact dosages):    Anesthesia method:  Local infiltration   Local anesthetic:  Lidocaine 1% WITH epi Procedure type:    Complexity:  Complex Procedure details:    Incision types:  Single straight   Incision depth:  Dermal   Scalpel blade:  11   Wound management:  Probed and deloculated, irrigated with saline and extensive cleaning   Drainage:  Bloody and purulent   Drainage amount:  Copious   Wound treatment:  Wound left open   Packing materials:  1/4 in gauze Post-procedure details:    Patient tolerance of procedure:  Tolerated well, no immediate complications  .Critical Care Performed by: Myrna Blazer, MD Authorized by: Myrna Blazer, MD   Critical care provider statement:    Critical care time (minutes):  35   Critical care time was exclusive of:  Separately billable procedures and treating other patients   Critical care was necessary to treat or prevent imminent or life-threatening deterioration of the following conditions:  Metabolic crisis   Critical care was time spent personally by me on the following activities:  Development of treatment plan with patient or surrogate, discussions with consultants,  evaluation of patient's response to treatment, examination of patient, obtaining history from patient or surrogate, ordering and performing treatments and interventions, ordering and review of laboratory studies, ordering and review of radiographic studies, pulse oximetry, re-evaluation of patient's condition and review of old charts    Critical Care performed:   ____________________________________________   INITIAL IMPRESSION / ASSESSMENT AND PLAN / ED COURSE  Pertinent labs & imaging results that were available during my care of the patient were reviewed by me and considered in my medical decision making (see chart for details).  DDX: Abscess, cellulitis, hyperglycemia As part of my medical decision making, I reviewed the following data within the electronic MEDICAL RECORD NUMBER Notes from prior ED visits  Abscess drained and patient tolerated the procedure well.  She is eating at this time we will recheck her blood sugar.  Likely that she dropped her blood sugar secondary to skipping lunch today.  We will give her first dose of doxycycline and likely sent home on doxycycline.  ----------------------------------------- 5:47 PM on 02/22/2018 -----------------------------------------  Patient at this time with glucose back down to 53.  She is now drinking orange juice again.  We will start an octreotide drip.  Her glucose is likely refractory hypoglycemia secondary to her glimepiride.  She is aware of the need to be admitted to the hospital.  Signed out to Dr. Katheren Shams. ____________________________________________   FINAL CLINICAL IMPRESSION(S) / ED DIAGNOSES  Left buttock abscess.  Refractory hypoglycemia  NEW MEDICATIONS STARTED DURING THIS VISIT:  New Prescriptions   No medications on file     Note:  This document was prepared using Dragon voice recognition software and may include unintentional dictation errors.     Myrna Blazer, MD 02/22/18 [phone]

## 2018-02-22 NOTE — ED Notes (Signed)
Clean, sterile dressing applied to abscess, left buttocks. Patient's incontinence brief changed. pericare performed.

## 2018-02-22 NOTE — ED Notes (Signed)
Spoke with admitting physician, states go ahead and start 65ml/hr of D10 despite CBG 147, if in 2 hours recheck is 300plus, infusion will be slowed.

## 2018-02-22 NOTE — ED Notes (Signed)
Patient given graham crackers and orange juice.  Patient states she has been having recurrent issues with low blood sugar.  Patient's glucose came back at 48 on lab work.

## 2018-02-23 ENCOUNTER — Other Ambulatory Visit: Payer: Self-pay

## 2018-02-23 DIAGNOSIS — E162 Hypoglycemia, unspecified: Secondary | ICD-10-CM | POA: Diagnosis not present

## 2018-02-23 DIAGNOSIS — E11649 Type 2 diabetes mellitus with hypoglycemia without coma: Secondary | ICD-10-CM | POA: Diagnosis not present

## 2018-02-23 LAB — CBC
HCT: 25.1 % — ABNORMAL LOW (ref 35.0–47.0)
Hemoglobin: 8.3 g/dL — ABNORMAL LOW (ref 12.0–16.0)
MCH: 26.6 pg (ref 26.0–34.0)
MCHC: 33 g/dL (ref 32.0–36.0)
MCV: 80.6 fL (ref 80.0–100.0)
PLATELETS: 283 10*3/uL (ref 150–440)
RBC: 3.11 MIL/uL — AB (ref 3.80–5.20)
RDW: 19.4 % — ABNORMAL HIGH (ref 11.5–14.5)
WBC: 6.5 10*3/uL (ref 3.6–11.0)

## 2018-02-23 LAB — BASIC METABOLIC PANEL
Anion gap: 8 (ref 5–15)
BUN: 16 mg/dL (ref 8–23)
CHLORIDE: 108 mmol/L (ref 98–111)
CO2: 26 mmol/L (ref 22–32)
CREATININE: 0.83 mg/dL (ref 0.44–1.00)
Calcium: 8.4 mg/dL — ABNORMAL LOW (ref 8.9–10.3)
GFR calc non Af Amer: >60 mL/min (ref >60)
Glucose, Bld: 102 mg/dL — ABNORMAL HIGH (ref 70–99)
POTASSIUM: 4 mmol/L (ref 3.5–5.1)
SODIUM: 142 mmol/L (ref 135–145)

## 2018-02-23 LAB — GLUCOSE, CAPILLARY
GLUCOSE-CAPILLARY: 102 mg/dL — AB (ref 70–99)
GLUCOSE-CAPILLARY: 93 mg/dL (ref 70–99)
GLUCOSE-CAPILLARY: 230 mg/dL — AB (ref 70–99)
Glucose-Capillary: 62 mg/dL — ABNORMAL LOW (ref 70–99)
Glucose-Capillary: 211 mg/dL — ABNORMAL HIGH (ref 70–99)

## 2018-02-23 LAB — MRSA PCR SCREENING: MRSA BY PCR: POSITIVE — AB

## 2018-02-23 MED ORDER — INSULIN ASPART 100 UNIT/ML ~~LOC~~ SOLN
0.0000 [IU] | Freq: Three times a day (TID) | SUBCUTANEOUS | Status: DC
  Administered 2018-02-23: 3 [IU] via SUBCUTANEOUS
  Filled 2018-02-23: qty 1

## 2018-02-23 MED ORDER — CHLORHEXIDINE GLUCONATE CLOTH 2 % EX PADS
6.0000 | MEDICATED_PAD | Freq: Every day | CUTANEOUS | Status: DC
  Administered 2018-02-23: 6 via TOPICAL

## 2018-02-23 MED ORDER — INSULIN ASPART 100 UNIT/ML ~~LOC~~ SOLN: 0.0000 [IU] | Freq: Three times a day (TID) | SUBCUTANEOUS | Status: DC

## 2018-02-23 MED ORDER — TRAZODONE HCL 50 MG PO TABS
50.0000 mg | ORAL_TABLET | Freq: Every day | ORAL | Status: DC
  Administered 2018-02-23: 50 mg via ORAL
  Filled 2018-02-23: qty 1

## 2018-02-23 MED ORDER — MUPIROCIN 2 % EX OINT
1.0000 "application " | TOPICAL_OINTMENT | Freq: Two times a day (BID) | CUTANEOUS | Status: DC
  Administered 2018-02-23: 1 via NASAL
  Filled 2018-02-23: qty 22

## 2018-02-23 MED ORDER — DOXYCYCLINE HYCLATE 100 MG PO TABS: 100.0000 mg | ORAL_TABLET | Freq: Two times a day (BID) | ORAL | 0 refills | Status: AC

## 2018-02-23 NOTE — NC FL2 (Signed)
[place] MEDICAID FL2 LEVEL OF CARE SCREENING TOOL     IDENTIFICATION  Patient Name: Cheryl Mueller Birthdate: 1940-12-15 Sex: female Admission Date (Current Location): 02/22/2018  [facility] and [place] Number:  Chiropodist and Address:  [facility], [address] [place], [place] [address]      Provider Number: [id number]  Attending Physician Name and Address:  Auburn Bilberry, MD  Relative Name and Phone Number:       Current Level of Care: Hospital Recommended Level of Care: Assisted Living Facility Prior Approval Number:    Date Approved/Denied:   PASRR Number:    Discharge Plan: (ALF)    Current Diagnoses: Patient Active Problem List   Diagnosis Date Noted  . Hypoglycemia 02/22/2018  . Anxiety and depression 05/11/2016  . 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 BP (high blood pressure) 10/20/2015  . HLD (hyperlipidemia) 10/20/2015  . Abdominal wall cellulitis 10/13/2015  . Anemia, iron deficiency 05/08/2015  . Deficiency in the vitamin folic acid 05/08/2015  . Compression fracture of lumbar vertebra (HCC) 05/08/2015  . Acute kidney injury (HCC) 04/20/2015  . Compression fracture of L1 lumbar vertebra (HCC) 04/18/2015  . Acute blood loss anemia 04/18/2015  . Hypokalemia 04/18/2015  . Dehydration 04/18/2015  . Hematoma 04/17/2015  . Cellulitis 04/17/2015  . Benign hypertension 04/17/2015  . Diabetes mellitus (HCC) 04/17/2015  . Cystocele, midline 08/11/2014  . Abnormal presence of protein in urine 06/20/2014  . Type 2 diabetes mellitus with other diabetic kidney complication (HCC) 12/14/2013    Orientation RESPIRATION BLADDER Height & Weight     Self, Place, Situation  Normal Incontinent Weight: 129 lb 11.2 oz (58.8 kg) Height:  4\' 11"  (149.9 cm)  BEHAVIORAL SYMPTOMS/MOOD NEUROLOGICAL BOWEL NUTRITION STATUS  (none) (none) Continent Diet(heart  healthy)  AMBULATORY STATUS COMMUNICATION OF NEEDS Skin   (wheelchair bound) Verbally Normal                       Personal Care Assistance Level of Assistance  Bathing, Feeding, Dressing Bathing Assistance: Limited assistance Feeding assistance: Limited assistance Dressing Assistance: Limited assistance     Functional Limitations Info  (no issues identified)          SPECIAL CARE FACTORS FREQUENCY  PT (By licensed PT)                    Contractures Contractures Info: Not present    Additional Factors Info  Code Status   Home Health RN Code Status Info: full             Discharge Medications        Allergies as of 02/23/2018      Reactions   Alendronate Other (See Comments)   Reaction:  Leg pain    Nutritional Supplements    Anaphylactic   Atorvastatin Rash   Formaldehyde Rash         Medication List    STOP taking these medications   glimepiride 4 MG tablet Commonly known as:  AMARYL     TAKE these medications   amLODipine 10 MG tablet Commonly known as:  NORVASC Take 10 mg by mouth every morning.   aspirin 81 MG chewable tablet Chew 81 mg by mouth daily.   atorvastatin 80 MG tablet Commonly known as:  LIPITOR Take 80 mg by mouth at bedtime.   doxepin 10 MG capsule Commonly known as:  SINEQUAN Take 20 mg by mouth at bedtime.   doxycycline 100 MG tablet Commonly known as:  VIBRA-TABS Take 1 tablet (100 mg total) by mouth every 12 (twelve) hours for 7 days.   ferrous sulfate 160 (50 Fe) MG Tbcr SR tablet Commonly known as:  SLOW FE Take 160 mg by mouth daily.   hydrochlorothiazide 12.5 MG capsule Commonly known as:  MICROZIDE Take 12.5 mg by mouth daily.   hydrOXYzine 25 MG tablet Commonly known as:  ATARAX/VISTARIL Take 25 mg by mouth 3 (three) times daily as needed.   lisinopril 40 MG tablet Commonly known as:  PRINIVIL,ZESTRIL Take 40 mg by mouth 2 (two) times daily.   memantine 10 MG  tablet Commonly known as:  NAMENDA Take 10 mg by mouth 2 (two) times daily.   metFORMIN 1000 MG tablet Commonly known as:  GLUCOPHAGE Take 1,000 mg by mouth 2 (two) times daily with a meal.   metoprolol succinate 25 MG 24 hr tablet Commonly known as:  TOPROL-XL Take 25 mg by mouth daily.   mirtazapine 15 MG tablet Commonly known as:  REMERON Take 15 mg by mouth at bedtime.   mometasone 0.1 % cream Commonly known as:  ELOCON Apply 1 application topically daily as needed (for itching).   multivitamin-lutein Caps capsule Take 1 capsule by mouth 2 (two) times daily with a meal.   omeprazole 20 MG tablet Commonly known as:  PRILOSEC OTC Take 20 mg by mouth daily before breakfast.   oxyCODONE 5 MG immediate release tablet Commonly known as:  Oxy IR/ROXICODONE Take 1 tablet (5 mg total) by mouth 2 (two) times daily as needed for breakthrough pain.   potassium chloride 10 MEQ tablet Commonly known as:  K-DUR,KLOR-CON Take 1 tablet by mouth daily.   potassium chloride 20 MEQ/15ML (10%) Soln Take 15 mLs by mouth 2 (two) times daily.   rOPINIRole 0.25 MG tablet Commonly known as:  REQUIP Take 2-4 tablets (0.5-1 mg total) by mouth at bedtime.   sitaGLIPtin 50 MG tablet Commonly known as:  JANUVIA Take 50 mg by mouth daily. What changed:  Another medication with the same name was removed. Continue taking this medication, and follow the directions you see here.   traZODone 50 MG tablet Commonly known as:  DESYREL Take 0.5 tablets (25 mg total) by mouth at bedtime. What changed:  how much to take   triamcinolone cream 0.1 % Commonly known as:  KENALOG Apply 1 application topically 2 (two) times daily as needed.   venlafaxine XR 75 MG 24 hr capsule Commonly known as:  EFFEXOR-XR Take 1 capsule by mouth daily.          York Spaniel, [place]

## 2018-02-23 NOTE — Progress Notes (Signed)
Physical Therapy Evaluation Patient Details Name: Cheryl Mueller MRN: [id number] DOB: January 01, 1941 Today's Date: 02/23/2018   History of Present Illness  Cheryl Mueller  is a 77 y.o. female with a known history of came in with a boil on the buttocks which was lanced by ER physician.  The ER physician noticed that her sugar dropped down into the 50s.  Clinical Impression  Patient presents very lethargic and closing her eyes on and off during PT session/treatment. She needs mod assist for supine to sit and sit to supine bed mobility. She has posterior and right lateral loss of balance in sitting and does not attempt to correct her static sitting balance with use of UEs . Transfers and gait were not tested due to poor sitting balance and lethargy. Patient will benefit from skilled PT to improve balance and strength.     Follow Up Recommendations SNF    Equipment Recommendations  Rolling walker with 5" wheels    Recommendations for Other Services       Precautions / Restrictions Precautions Precautions: Fall Restrictions Weight Bearing Restrictions: No      Mobility  Bed Mobility Overal bed mobility: Needs Assistance             General bed mobility comments: mod assist  Transfers Overall transfer level: (NT due to poor sitting balance)                  Ambulation/Gait Ambulation/Gait assistance: (NT due to poor sitting balance)              Stairs            Wheelchair Mobility    Modified Rankin (Stroke Patients Only)       Balance Overall balance assessment: Needs assistance Sitting-balance support: Bilateral upper extremity supported;Feet supported Sitting balance-Leahy Scale: Poor Sitting balance - Comments: leanig posterior and laterally l Postural control: Posterior lean;Right lateral lean Standing balance support: Bilateral upper extremity supported                                 Pertinent Vitals/Pain Pain Assessment:  No/denies pain    Home Living Family/patient expects to be discharged to:: Skilled nursing facility                      Prior Function Level of Independence: (unknown)               Hand Dominance        Extremity/Trunk Assessment   Upper Extremity Assessment Upper Extremity Assessment: Overall WFL for tasks assessed    Lower Extremity Assessment Lower Extremity Assessment: Generalized weakness    Cervical / Trunk Assessment Cervical / Trunk Assessment: Kyphotic  Communication   Communication: No difficulties  Cognition Arousal/Alertness: Lethargic Behavior During Therapy: Flat affect Overall Cognitive Status: No family/caregiver present to determine baseline cognitive functioning                                        General Comments      Exercises     Assessment/Plan    PT Assessment Patient needs continued PT services  PT Problem List Decreased strength;Decreased activity tolerance;Decreased balance;Decreased mobility       PT Treatment Interventions Functional mobility training;Therapeutic exercise;Balance training    PT Goals (Current goals can be  found in the Care Plan section)  Acute Rehab PT Goals Patient Stated Goal: no goals stated PT Goal Formulation: Patient unable to participate in goal setting Time For Goal Achievement: 03/09/18 Potential to Achieve Goals: Fair    Frequency Min 2X/week   Barriers to discharge Other (comment) (unable to determine any barriers)    Co-evaluation               AM-PAC PT "6 Clicks" Daily Activity  Outcome Measure Difficulty turning over in bed (including adjusting bedclothes, sheets and blankets)?: A Lot Difficulty moving from lying on back to sitting on the side of the bed? : A Lot Difficulty sitting down on and standing up from a chair with arms (e.g., wheelchair, bedside commode, etc,.)?: Unable Help needed moving to and from a bed to chair (including a wheelchair)?:  Total Help needed walking in hospital room?: Total Help needed climbing 3-5 steps with a railing? : Total 6 Click Score: 8    End of Session Equipment Utilized During Treatment: Gait belt Activity Tolerance: Patient limited by fatigue;Patient limited by lethargy Patient left: Other (comment)(nurse in room)   PT Visit Diagnosis: (generalized weakness)    Time: 1115-1140 PT Time Calculation (min) (ACUTE ONLY): 25 min   Charges:   PT Evaluation $PT Eval Low Complexity: 1 Low PT Treatments $Therapeutic Activity: 8-22 mins         Ezekiel Ina, PT DPT 02/23/2018, 2:17 PM

## 2018-02-23 NOTE — Clinical Social Work Note (Signed)
Clinical Social Work Assessment  Patient Details  Name: Cheryl Mueller MRN: [id number] Date of Birth: [birth year]  Date of referral:  02/23/18               Reason for consult:  Discharge Planning                Permission sought to share information with:  Family Supports, Magazine features editor Permission granted to share information::  Yes, Verbal Permission Granted  Name::        Agency::     Relationship::     Contact Information:     Housing/Transportation Living arrangements for the past 2 months:  Assisted Dealer of Information:  Facility, Adult Children Patient Interpreter Needed:  None Criminal Activity/Legal Involvement Pertinent to Current Situation/Hospitalization:  No - Comment as needed Significant Relationships:  Adult Children Lives with:    Do you feel safe going back to the place where you live?  Yes Need for family participation in patient care:  No (Coment)  Care giving concerns:  none   Office manager / plan:  CSW informed by MD that patient is going to discharge today to return to [facility].   CSW contacted Vernona Rieger at [facility] and she will be able to pick patient up today. Discharge information provided to Vernona Rieger.  CSW has contacted patient's son: [name]: [phone] and notified him of the discharge.  Employment status:  Retired Database administrator PT Recommendations:    Information / Referral to community resources:     Patient/Family's Response to care:  Patient's son was Adult nurse of CSW call.   Patient/Family's Understanding of and Emotional Response to Diagnosis, Current Treatment, and Prognosis:  Patient's son is pleased patient will be returning to her facility today.   Emotional Assessment Appearance:  Appears stated age Attitude/Demeanor/Rapport:    Affect (typically observed):  Calm Orientation:  Oriented to Self, Oriented to Place, Oriented to Situation Alcohol  / Substance use:  Not Applicable Psych involvement (Current and /or in the community):  No (Comment)  Discharge Needs  Concerns to be addressed:  Care Coordination Readmission within the last 30 days:  No Current discharge risk:  None Barriers to Discharge:  No Barriers Identified   York Spaniel, LCSW 02/23/2018, 1:40 PM

## 2018-02-23 NOTE — Discharge Summary (Signed)
[facility] [place] [facility] [place], 77 y.o., DOB 1940/07/30, MRN [id number]. Admission date: 02/22/2018 Discharge Date 02/23/2018 Primary MD Lynnea Ferrier, MD Admitting Physician Alford Highland, MD  Admission Diagnosis  Hypoglycemia [E16.2] Left buttock abscess [L02.31]  Discharge Diagnosis   Active Problems: Recurrent hypoglycemia with history of diabetes we will discontinue patient's glipizide Abscess on the buttocks status post incision and drainage patient will packing and packing of the wound on daily basis Itching and rash now improved may need dermatology evaluation if persists Hyperkalemia History of TIA Essential hypertension History of cervical myelopathy       Hospital Course Cheryl Mueller  is a 77 y.o. female with a known history of came in with a boil on the buttocks which was lanced by ER physician.  The ER physician noticed that her sugar dropped down into the 50s.  Patient was admitted for hypoglycemia.  Her hemoglobin A1c was 6.4.  At this point her glipizide has been discontinued and her sugars started climbing up however she did receive the 10.  Patient will need this closely monitor to take to her primary care provider to decide which of the medications need discontinuation.  Patient also had a buttocks abscess that was lanced.  I was able to drain more pus today.  She will need daily packing and repacking of of this wound until healed.  She will be on oral antibiotics.  The wound should be packed with iodoform on daily basis and change every day.  Also recommend patient do body wash with over-the-counter Hibiclens for 5 days and repeat the steps every few months to avoid recurrent skin infections         Consults  None  Significant Tests:  See full reports for all details     Ct Abdomen Pelvis W Contrast  Result Date: 02/16/2018 CLINICAL DATA:  Acute abdominal pain EXAM: CT ABDOMEN AND PELVIS WITH CONTRAST TECHNIQUE:  Multidetector CT imaging of the abdomen and pelvis was performed using the standard protocol following bolus administration of intravenous contrast. CONTRAST:  75mL OMNIPAQUE IOHEXOL 300 MG/ML  SOLN COMPARISON:  10/13/2015 CT abdomen pelvis FINDINGS: LOWER CHEST: There is a small hiatal hernia that is increased in size from the prior study. HEPATOBILIARY: Normal hepatic contours and density. No intra- or extrahepatic biliary dilatation. Normal gallbladder. PANCREAS: Normal parenchymal contours without ductal dilatation. No peripancreatic fluid collection. SPLEEN: Normal. ADRENALS/URINARY TRACT: --Adrenal glands: Normal. --Right kidney/ureter: No hydronephrosis, nephroureterolithiasis, perinephric stranding or solid renal mass. --Left kidney/ureter: No hydronephrosis, nephroureterolithiasis, perinephric stranding or solid renal mass. --Urinary bladder: Normal for degree of distention STOMACH/BOWEL: --Stomach/Duodenum: Small hiatal hernia, increased in size from the prior study. --Small bowel: No dilatation or inflammation. --Colon: No focal abnormality. --Appendix: Normal. VASCULAR/LYMPHATIC: Atherosclerotic calcification is present within the non-aneurysmal abdominal aorta, without hemodynamically significant stenosis. The portal vein, splenic vein, superior mesenteric vein and IVC are patent. No abdominal or pelvic lymphadenopathy. REPRODUCTIVE: Normal uterus and ovaries. MUSCULOSKELETAL. No bony spinal canal stenosis or focal osseous abnormality. OTHER: Remote L1-L2 vertebral augmentation. IMPRESSION: 1. Increased size of small hiatal hernia relative to the prior studies. 2. No acute abnormality of the abdomen or pelvis. 3.  Aortic Atherosclerosis (ICD10-I70.0). Electronically Signed   By: Deatra Robinson M.D.   On: 02/16/2018 16:49   Dg Chest Portable 1 View  Result Date: 02/16/2018 CLINICAL DATA:  Hypoglycemia, generalized weakness. EXAM: PORTABLE CHEST 1 VIEW COMPARISON:  Radiographs of December 15, 2016.  FINDINGS: Stable cardiomediastinal  silhouette. No pneumothorax or pleural effusion is noted. No acute pulmonary disease is noted. Status post left shoulder arthroplasty. IMPRESSION: No acute cardiopulmonary abnormality seen. Electronically Signed   By: Lupita Raider, M.D.   On: 02/16/2018 14:06       Today   Subjective:   Freddrick March patient denies any complaints Objective:   Blood pressure (!) 139/58, pulse 88, temperature 98.2 F (36.8 C), resp. rate 19, height 4\' 11"  (1.499 m), weight 58.8 kg, SpO2 98 %.  .  Intake/Output Summary (Last 24 hours) at 02/23/2018 1221 Last data filed at 02/23/2018 0935 Gross per 24 hour  Intake 614.82 ml  Output 1200 ml  Net -585.18 ml    Exam VITAL SIGNS: Blood pressure (!) 139/58, pulse 88, temperature 98.2 F (36.8 C), resp. rate 19, height 4\' 11"  (1.499 m), weight 58.8 kg, SpO2 98 %.  GENERAL:  77 y.o.-year-old patient lying in the bed with no acute distress.  EYES: Pupils equal, round, reactive to light and accommodation. No scleral icterus. Extraocular muscles intact.  HEENT: Head atraumatic, normocephalic. Oropharynx and nasopharynx clear.  NECK:  Supple, no jugular venous distention. No thyroid enlargement, no tenderness.  LUNGS: Normal breath sounds bilaterally, no wheezing, rales,rhonchi or crepitation. No use of accessory muscles of respiration.  CARDIOVASCULAR: S1, S2 normal. No murmurs, rubs, or gallops.  ABDOMEN: Soft, nontender, nondistended. Bowel sounds present. No organomegaly or mass.  EXTREMITIES: No pedal edema, cyanosis, or clubbing.  NEUROLOGIC: Cranial nerves II through XII are intact. Muscle strength 5/5 in all extremities. Sensation intact. Gait not checked.  PSYCHIATRIC: The patient is alert and oriented x 3.  SKIN: No obvious rash, lesion, or ulcer.   Data Review     CBC w Diff:  Lab Results  Component Value Date   WBC 6.5 02/23/2018   HGB 8.3 (L) 02/23/2018   HCT 25.1 (L) 02/23/2018   PLT 283 02/23/2018    LYMPHOPCT 4 02/16/2018   MONOPCT 8 02/16/2018   EOSPCT 6 02/16/2018   BASOPCT 1 02/16/2018   CMP:  Lab Results  Component Value Date   NA 142 02/23/2018   K 4.0 02/23/2018   CL 108 02/23/2018   CO2 26 02/23/2018   BUN 16 02/23/2018   CREATININE 0.83 02/23/2018   PROT 7.6 02/16/2018   ALBUMIN 4.0 02/16/2018   BILITOT 0.5 02/16/2018   ALKPHOS 98 02/16/2018   AST 23 02/16/2018   ALT 14 02/16/2018  .  Micro Results Recent Results (from the past 240 hour(s))  Urine culture     Status: None   Collection Time: 02/16/18  2:59 PM  Result Value Ref Range Status   Specimen Description   Final    URINE, RANDOM Performed at [facility], [address] [place], [place] [address]    Special Requests   Final    NONE Performed at [facility], [address], [place], [place] [address]    Culture   Final    NO GROWTH Performed at [facility], [address], [place], [place] [address]    Report Status 02/18/2018 FINAL  Final  MRSA PCR Screening     Status: Abnormal   Collection Time: 02/23/18  7:19 AM  Result Value Ref Range Status   MRSA by PCR POSITIVE (A) NEGATIVE Final    Comment:        The GeneXpert MRSA Assay (FDA approved for NASAL specimens only), is one component of a comprehensive MRSA colonization surveillance program.  It is not intended to diagnose MRSA infection nor to guide or monitor treatment for MRSA infections. RESULT CALLED TO, READ BACK BY AND VERIFIED WITH: JUAN RODRIGUEZ AT 0841 ON 02/23/18 [facility]. Performed at [facility], [address], [place], [place] [address]         Code Status Orders  (From admission, onward)         Start     Ordered   02/22/18 1824  Full code  Continuous     02/22/18 1824        Code Status History    Date Active Date Inactive Code Status Order ID Comments User Context   06/26/2016 2031 07/03/2016 1536 Full Code [id number]  Altamese Dilling, MD ED   04/16/2016 1208  04/17/2016 1841 Full Code [id number]  Poggi, Excell Seltzer, MD Inpatient   10/13/2015 1709 10/18/2015 1435 Full Code [id number]  Enid Baas, MD Inpatient   04/17/2015 1630 04/21/2015 1423 Full Code [id number]  Lynnea Ferrier, MD Inpatient   03/23/2015 1427 03/23/2015 1750 Full Code [id number]  Kennedy Bucker, MD Inpatient   11/26/2012 1641 11/27/2012 1302 Full Code [id number]  Hewitt Shorts, MD Inpatient          Follow-up Information    Curtis Sites III, MD Follow up in 3 day(s).   Specialty:  Internal Medicine Why:  hosp f/u , buttock abscess eval Contact information: [address] [place] [address] [phone]           Discharge Medications   Allergies as of 02/23/2018      Reactions   Alendronate Other (See Comments)   Reaction:  Leg pain    Nutritional Supplements    Anaphylactic   Atorvastatin Rash   Formaldehyde Rash      Medication List    STOP taking these medications   glimepiride 4 MG tablet Commonly known as:  AMARYL     TAKE these medications   amLODipine 10 MG tablet Commonly known as:  NORVASC Take 10 mg by mouth every morning.   aspirin 81 MG chewable tablet Chew 81 mg by mouth daily.   atorvastatin 80 MG tablet Commonly known as:  LIPITOR Take 80 mg by mouth at bedtime.   doxepin 10 MG capsule Commonly known as:  SINEQUAN Take 20 mg by mouth at bedtime.   doxycycline 100 MG tablet Commonly known as:  VIBRA-TABS Take 1 tablet (100 mg total) by mouth every 12 (twelve) hours for 7 days.   ferrous sulfate 160 (50 Fe) MG Tbcr SR tablet Commonly known as:  SLOW FE Take 160 mg by mouth daily.   hydrochlorothiazide 12.5 MG capsule Commonly known as:  MICROZIDE Take 12.5 mg by mouth daily.   hydrOXYzine 25 MG tablet Commonly known as:  ATARAX/VISTARIL Take 25 mg by mouth 3 (three) times daily as needed.   lisinopril 40 MG tablet Commonly known as:  PRINIVIL,ZESTRIL Take 40 mg by mouth 2 (two) times daily.   memantine 10  MG tablet Commonly known as:  NAMENDA Take 10 mg by mouth 2 (two) times daily.   metFORMIN 1000 MG tablet Commonly known as:  GLUCOPHAGE Take 1,000 mg by mouth 2 (two) times daily with a meal.   metoprolol succinate 25 MG 24 hr tablet Commonly known as:  TOPROL-XL Take 25 mg by mouth daily.   mirtazapine 15 MG tablet Commonly known as:  REMERON Take 15 mg by mouth at bedtime.   mometasone 0.1 % cream Commonly known  asDerry Skill Apply 1 application topically daily as needed (for itching).   multivitamin-lutein Caps capsule Take 1 capsule by mouth 2 (two) times daily with a meal.   omeprazole 20 MG tablet Commonly known as:  PRILOSEC OTC Take 20 mg by mouth daily before breakfast.   oxyCODONE 5 MG immediate release tablet Commonly known as:  Oxy IR/ROXICODONE Take 1 tablet (5 mg total) by mouth 2 (two) times daily as needed for breakthrough pain.   potassium chloride 10 MEQ tablet Commonly known as:  K-DUR,KLOR-CON Take 1 tablet by mouth daily.   potassium chloride 20 MEQ/15ML (10%) Soln Take 15 mLs by mouth 2 (two) times daily.   rOPINIRole 0.25 MG tablet Commonly known as:  REQUIP Take 2-4 tablets (0.5-1 mg total) by mouth at bedtime.   sitaGLIPtin 50 MG tablet Commonly known as:  JANUVIA Take 50 mg by mouth daily. What changed:  Another medication with the same name was removed. Continue taking this medication, and follow the directions you see here.   traZODone 50 MG tablet Commonly known as:  DESYREL Take 0.5 tablets (25 mg total) by mouth at bedtime. What changed:  how much to take   triamcinolone cream 0.1 % Commonly known as:  KENALOG Apply 1 application topically 2 (two) times daily as needed.   venlafaxine XR 75 MG 24 hr capsule Commonly known as:  EFFEXOR-XR Take 1 capsule by mouth daily.          Total Time in preparing paper work, data evaluation and todays exam - 35 minutes  Auburn Bilberry M.D on 02/23/2018 at 12:21 PM [facility]    Office  [phone]

## 2018-02-23 NOTE — Progress Notes (Signed)
RN notified [name] his mom is being DC.

## 2018-02-23 NOTE — Care Management Note (Signed)
Case Management Note  Patient Details  Name: ALAMEDA MOLINO MRN: [id number] Date of Birth: 1940/11/01  Subjective/Objective:   Admitted to [facility] from [facility] with the diagnosis of hypoglycemia.                  Action/Plan: Will need home health at [facility].  Becky Sax, [facility] representative updated   Expected Discharge Date:  02/23/18               Expected Discharge Plan:     In-House Referral:     Discharge planning Services   yes  Post Acute Care Choice:    Choice offered to:     DME Arranged:    DME Agency:     [facility] Arranged:yes                                                                                                 HH Agency:   [facility]  Status of Service:     If discussed at Long Length of Stay Meetings, dates discussed:    Additional Comments:  Gwenette Greet, RN MSN CCM Care Management [phone] 02/23/2018, 12:39 PM

## 2018-02-23 NOTE — Progress Notes (Signed)
Inpatient Diabetes Program Recommendations  AACE/ADA: New Consensus Statement on Inpatient Glycemic Control (2015)  Target Ranges:  Prepandial:   less than 140 mg/dL      Peak postprandial:   less than 180 mg/dL (1-2 hours)      Critically ill patients:  140 - 180 mg/dL    Results for Cheryl Mueller, Cheryl Mueller (MRN [id number]) as of 02/23/2018 10:10  Ref. Range 02/22/2018 16:39 02/22/2018 17:37 02/22/2018 18:35 02/22/2018 20:38 02/22/2018 23:01 02/22/2018 23:49  Glucose-Capillary Latest Ref Range: 70 - 99 mg/dL 72 53 (L) 312 (H) 811 (H) 125 (H) 135 (H)   Results for Cheryl Mueller, Cheryl Mueller (MRN [id number]) as of 02/23/2018 10:10  Ref. Range 02/23/2018 02:01 02/23/2018 04:20 02/23/2018 07:50 02/23/2018 10:07  Glucose-Capillary Latest Ref Range: 70 - 99 mg/dL 62 (L) 681 (H) 93 594 (H)   Results for Cheryl Mueller, Cheryl Mueller (MRN [id number]) as of 02/23/2018 10:10  Ref. Range 02/22/2018 18:36  Hemoglobin A1C Latest Ref Range: 4.8 - 5.6 % 6.4 (H)     Admit with: Buttock Abscess/ Hypoglycemia  History: DM  Home DM Meds: Amaryl 2 mg daily       Metformin 1000 mg BID       Januvia 50 mg daily  Current Orders: None at present--Checking CBGs frequently      MD- Given A1c of 6.4% and reported Hypoglycemia at home, may consider stopping home dose of Amaryl for home use until patient can follow up with her PCP.  Would continue Metformin and Januvia at home (these two meds have much lower risk for hypoglycemia)     --Will follow patient during hospitalization--  Ambrose Finland RN, MSN, CDE Diabetes Coordinator Inpatient Glycemic Control Team Team Pager: [phone] (8a-5p)

## 2018-02-23 NOTE — Progress Notes (Signed)
Cheryl Mueller  A and O x 4. VSS. Pt tolerating diet well. No complaints of pain or nausea. IV removed intact, prescriptions given. Pt voiced understanding of discharge instructions with no further questions. Pt discharged via wheelchair with [facility].pt DC back to [facility].     Allergies as of 02/23/2018      Reactions   Alendronate Other (See Comments)   Reaction:  Leg pain    Nutritional Supplements    Anaphylactic   Atorvastatin Rash   Formaldehyde Rash      Medication List    STOP taking these medications   glimepiride 4 MG tablet Commonly known as:  AMARYL     TAKE these medications   amLODipine 10 MG tablet Commonly known as:  NORVASC Take 10 mg by mouth every morning.   aspirin 81 MG chewable tablet Chew 81 mg by mouth daily.   atorvastatin 80 MG tablet Commonly known as:  LIPITOR Take 80 mg by mouth at bedtime.   doxepin 10 MG capsule Commonly known as:  SINEQUAN Take 20 mg by mouth at bedtime.   doxycycline 100 MG tablet Commonly known as:  VIBRA-TABS Take 1 tablet (100 mg total) by mouth every 12 (twelve) hours for 7 days.   ferrous sulfate 160 (50 Fe) MG Tbcr SR tablet Commonly known as:  SLOW FE Take 160 mg by mouth daily.   hydrochlorothiazide 12.5 MG capsule Commonly known as:  MICROZIDE Take 12.5 mg by mouth daily.   hydrOXYzine 25 MG tablet Commonly known as:  ATARAX/VISTARIL Take 25 mg by mouth 3 (three) times daily as needed.   lisinopril 40 MG tablet Commonly known as:  PRINIVIL,ZESTRIL Take 40 mg by mouth 2 (two) times daily.   memantine 10 MG tablet Commonly known as:  NAMENDA Take 10 mg by mouth 2 (two) times daily.   metFORMIN 1000 MG tablet Commonly known as:  GLUCOPHAGE Take 1,000 mg by mouth 2 (two) times daily with a meal.   metoprolol succinate 25 MG 24 hr tablet Commonly known as:  TOPROL-XL Take 25 mg by mouth daily.   mirtazapine 15 MG tablet Commonly known as:  REMERON Take 15 mg by mouth at bedtime.    mometasone 0.1 % cream Commonly known as:  ELOCON Apply 1 application topically daily as needed (for itching).   multivitamin-lutein Caps capsule Take 1 capsule by mouth 2 (two) times daily with a meal.   omeprazole 20 MG tablet Commonly known as:  PRILOSEC OTC Take 20 mg by mouth daily before breakfast.   oxyCODONE 5 MG immediate release tablet Commonly known as:  Oxy IR/ROXICODONE Take 1 tablet (5 mg total) by mouth 2 (two) times daily as needed for breakthrough pain.   potassium chloride 10 MEQ tablet Commonly known as:  K-DUR,KLOR-CON Take 1 tablet by mouth daily.   potassium chloride 20 MEQ/15ML (10%) Soln Take 15 mLs by mouth 2 (two) times daily.   rOPINIRole 0.25 MG tablet Commonly known as:  REQUIP Take 2-4 tablets (0.5-1 mg total) by mouth at bedtime.   sitaGLIPtin 50 MG tablet Commonly known as:  JANUVIA Take 50 mg by mouth daily. What changed:  Another medication with the same name was removed. Continue taking this medication, and follow the directions you see here.   traZODone 50 MG tablet Commonly known as:  DESYREL Take 0.5 tablets (25 mg total) by mouth at bedtime. What changed:  how much to take   triamcinolone cream 0.1 % Commonly known as:  KENALOG  Apply 1 application topically 2 (two) times daily as needed.   venlafaxine XR 75 MG 24 hr capsule Commonly known as:  EFFEXOR-XR Take 1 capsule by mouth daily.       Vitals:   02/23/18 0425 02/23/18 0930  BP: (!) 160/76 (!) 139/58  Pulse: 88 88  Resp:  19  Temp:  98.2 F (36.8 C)  SpO2:  98%    Cheryl Mueller

## 2018-07-10 ENCOUNTER — Other Ambulatory Visit: Payer: Self-pay

## 2018-07-10 ENCOUNTER — Encounter: Payer: Self-pay | Admitting: Emergency Medicine

## 2018-07-10 ENCOUNTER — Ambulatory Visit
Admission: EM | Admit: 2018-07-10 | Discharge: 2018-07-10 | Disposition: A | Discharge status: Home / Self Care | Payer: Medicare Other | Attending: Family Medicine | Admitting: Family Medicine

## 2018-07-10 DIAGNOSIS — I10 Essential (primary) hypertension: Secondary | ICD-10-CM | POA: Diagnosis not present

## 2018-07-10 DIAGNOSIS — L0291 Cutaneous abscess, unspecified: Secondary | ICD-10-CM

## 2018-07-10 DIAGNOSIS — E119 Type 2 diabetes mellitus without complications: Secondary | ICD-10-CM

## 2018-07-10 NOTE — ED Provider Notes (Signed)
[facility]    CSN: [id number] Arrival date & time: 07/10/18  1708  History   Chief Complaint Chief Complaint  Patient presents with  . Abscess   HPI  78 year old female with a complicated past medical history presents with multiple abscesses.  Patient just lost her husband.  She was today.  Per the family and the patient, she has had approximately 4 days of developing abscesses in multiple areas along her abdomen, chest, arms, and back.  Patient and family deny fever.  She has a large abscess in the center of her abdomen, in the epigastric region.  She has multiple other areas that she would like to be examined today.  Per the family, she often picks at her skin.  This is likely the culprit for her abscesses.  No medications or interventions tried.  No other reported symptoms.  No other complaints.  PMH, Surgical Hx, Family Hx, Social History reviewed and updated as below.  Past Medical History:  Diagnosis Date  . Anxiety   . Arthritis   . Cervical myelopathy (HCC)   . Diabetes mellitus without complication (HCC)    Non Insulin dependant  . Headache    h/o migraines  . Hyperlipemia   . Hypertension   . Restless leg syndrome   . Stroke [facility](HCC) 2002   mini stroke  . TIA (transient ischemic attack)    approx 15 years ago  . Vertigo    hx of  . White matter disease     Patient Active Problem List   Diagnosis Date Noted  . Hypoglycemia 02/22/2018  . Anxiety and depression 05/11/2016  . 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 BP (high blood pressure) 10/20/2015  . HLD (hyperlipidemia) 10/20/2015  . Abdominal wall cellulitis 10/13/2015  . Anemia, iron deficiency 05/08/2015  . Deficiency in the vitamin folic acid 05/08/2015  . Compression fracture of lumbar vertebra (HCC) 05/08/2015  . Acute kidney injury (HCC) 04/20/2015  . Compression fracture of L1 lumbar vertebra (HCC) 04/18/2015  .  Acute blood loss anemia 04/18/2015  . Hypokalemia 04/18/2015  . Dehydration 04/18/2015  . Hematoma 04/17/2015  . Cellulitis 04/17/2015  . Benign hypertension 04/17/2015  . Diabetes mellitus (HCC) 04/17/2015  . Cystocele, midline 08/11/2014  . Abnormal presence of protein in urine 06/20/2014  . Type 2 diabetes mellitus with other diabetic kidney complication (HCC) 12/14/2013    Past Surgical History:  Procedure Laterality Date  . APPLICATION OF WOUND VAC  07/01/2016   Procedure: APPLICATION OF WOUND VAC;  Surgeon: Christena FlakeJohn J Poggi, MD;  Location: [facility] ORS;  Service: Orthopedics;;  . BACK SURGERY  2012   kyphoplasty lower back  . COLONOSCOPY W/ POLYPECTOMY    . DEBRIDEMENT OF ABDOMINAL WALL ABSCESS N/A 10/16/2015   Procedure: I & D OF ABDOMINAL WALL ABSCESS;  Surgeon: Lattie Hawichard E Cooper, MD;  Location: [facility] ORS;  Service: General;  Laterality: N/A;  . IRRIGATION AND DEBRIDEMENT SHOULDER Left 07/01/2016   Procedure: IRRIGATION AND DEBRIDEMENT SHOULDER;  Surgeon: Christena FlakeJohn J Poggi, MD;  Location: [facility] ORS;  Service: Orthopedics;  Laterality: Left;  . KYPHOPLASTY N/A 04/19/2015   Procedure: KYPHOPLASTY L 1;  Surgeon: Kennedy BuckerMichael Menz, MD;  Location: [facility] OR[facility];  Service: Orthopedics;  Laterality: N/A;  . KYPHOPLASTY N/A 02/27/2016   Procedure: KYPHOPLASTY;  Surgeon: Kennedy BuckerMichael Menz, MD;  Location: [facility] OR[facility];  Service: Orthopedics;  Laterality: N/A;  . LUMBAR LAMINECTOMY/DECOMPRESSION MICRODISCECTOMY Right 11/26/2012   Procedure: LUMBAR LAMINECTOMY/DECOMPRESSION MICRODISCECTOMY  1 LEVEL;  Surgeon: Hewitt Shorts, MD;  Location: [facility];  Service: Neurosurgery;  Laterality: Right;  Lumbar five-sacral one laminotomy and microdiskectomy   . ORIF PATELLA Left 03/23/2015   Procedure: OPEN REDUCTION INTERNAL (ORIF) FIXATION PATELLA;  Surgeon: Kennedy Bucker, MD;  Location: [facility] OR[facility];  Service: Orthopedics;  Laterality: Left;  . REVERSE SHOULDER ARTHROPLASTY Left 04/16/2016   Procedure: REVERSE SHOULDER ARTHROPLASTY;   Surgeon: Christena Flake, MD;  Location: [facility] ORS;  Service: Orthopedics;  Laterality: Left;    OB History    Gravida  3   Para  3   Term      Preterm      AB      Living        SAB      TAB      Ectopic      Multiple      Live Births           Obstetric Comments  1st Menstrual Cycle:  11 1st Pregnancy:  19         Home Medications    Prior to Admission medications   Medication Sig Start Date End Date Taking? Authorizing Provider  amLODipine (NORVASC) 10 MG tablet Take 10 mg by mouth every morning.    [provider]  aspirin 81 MG chewable tablet Chew 81 mg by mouth daily.    [provider]  atorvastatin (LIPITOR) 80 MG tablet Take 80 mg by mouth at bedtime.    [provider]  doxepin (SINEQUAN) 10 MG capsule Take 20 mg by mouth at bedtime. 10/29/16   [provider]  ferrous sulfate (SLOW FE) 160 (50 Fe) MG TBCR SR tablet Take 160 mg by mouth daily.    [provider]  hydrochlorothiazide (MICROZIDE) 12.5 MG capsule Take 12.5 mg by mouth daily.     [provider]  hydrOXYzine (ATARAX/VISTARIL) 25 MG tablet Take 25 mg by mouth 3 (three) times daily as needed.    [provider]  lisinopril (PRINIVIL,ZESTRIL) 40 MG tablet Take 40 mg by mouth 2 (two) times daily.     [provider]  memantine (NAMENDA) 10 MG tablet Take 10 mg by mouth 2 (two) times daily.    [provider]  metFORMIN (GLUCOPHAGE) 1000 MG tablet Take 1,000 mg by mouth 2 (two) times daily with a meal.    [provider]  metoprolol succinate (TOPROL-XL) 25 MG 24 hr tablet Take 25 mg by mouth daily.    [provider]  mirtazapine (REMERON) 15 MG tablet Take 15 mg by mouth at bedtime.    [provider]  mometasone (ELOCON) 0.1 % cream Apply 1 application topically daily as needed (for itching).    [provider]  multivitamin-lutein (OCUVITE-LUTEIN) CAPS capsule Take 1 capsule by  mouth 2 (two) times daily with a meal.    [provider]  omeprazole (PRILOSEC OTC) 20 MG tablet Take 20 mg by mouth daily before breakfast.    [provider]  oxyCODONE (OXY IR/ROXICODONE) 5 MG immediate release tablet Take 1 tablet (5 mg total) by mouth 2 (two) times daily as needed for breakthrough pain. 07/03/16   Delfino Lovett, MD  potassium chloride (K-DUR,KLOR-CON) 10 MEQ tablet Take 1 tablet by mouth daily. 05/10/15   [provider]  potassium chloride 20 MEQ/15ML (10%) SOLN Take 15 mLs by mouth 2 (two) times daily. 12/10/16   [provider]  rOPINIRole (REQUIP) 0.25 MG tablet  Take 2-4 tablets (0.5-1 mg total) by mouth at bedtime. 12/15/16   Sharman Cheek, MD  sitaGLIPtin (JANUVIA) 50 MG tablet Take 50 mg by mouth daily.    [provider]  traZODone (DESYREL) 50 MG tablet Take 0.5 tablets (25 mg total) by mouth at bedtime. Patient taking differently: Take 100 mg by mouth at bedtime.  10/18/15   Enid Baas, MD  triamcinolone cream (KENALOG) 0.1 % Apply 1 application topically 2 (two) times daily as needed.    [provider]  venlafaxine XR (EFFEXOR-XR) 75 MG 24 hr capsule Take 1 capsule by mouth daily. 06/19/16   [provider]    Family History Family History  Problem Relation Age of Onset  . Stroke Mother   . Hypertension Mother   . Diabetes Father   . Breast cancer Neg Hx     Social History Social History   Tobacco Use  . Smoking status: Never Smoker  . Smokeless tobacco: Never Used  Substance Use Topics  . Alcohol use: No    Alcohol/week: 0.0 standard drinks  . Drug use: No     Allergies   Alendronate; Nutritional supplements; Atorvastatin; and Formaldehyde   Review of Systems Review of Systems  Constitutional: Negative for fever.  Skin:       Multiple abscesses.   Physical Exam Triage Vital Signs ED Triage Vitals  Enc Vitals Group     BP 07/10/18 1737 (!) 134/59     Pulse Rate  07/10/18 1737 88     Resp 07/10/18 1737 18     Temp 07/10/18 1737 98.2 F (36.8 C)     Temp Source 07/10/18 1737 Oral     SpO2 07/10/18 1737 98 %     Weight 07/10/18 1731 125 lb (56.7 kg)     Height 07/10/18 1731 4' 11.5" (1.511 m)     Head Circumference --      Peak Flow --      Pain Score 07/10/18 1731 0     Pain Loc --      Pain Edu? --      Excl. in GC? --    Updated Vital Signs BP (!) 134/59 (BP Location: Left Arm)   Pulse 88   Temp 98.2 F (36.8 C) (Oral)   Resp 18   Ht 4' 11.5" (1.511 m)   Wt 56.7 kg   SpO2 98%   BMI 24.82 kg/m   Visual Acuity Right Eye Distance:   Left Eye Distance:   Bilateral Distance:    Right Eye Near:   Left Eye Near:    Bilateral Near:     Physical Exam Vitals signs and nursing note reviewed.  Constitutional:      General: She is not in acute distress.    Comments: Frail, elderly female in no acute distress.  HENT:     Head: Normocephalic and atraumatic.     Nose: Nose normal.  Eyes:     Comments: Right eye with mild conjunctival injection.  Purulent discharge noted from the right eye.  Cardiovascular:     Rate and Rhythm: Normal rate and regular rhythm.  Skin:    Comments: Large 8.5 cm x 5 cm abscess noted at the epigastric region.  This area is currently draining.  Patient has multiple other small abscesses noted around the right anterior chest wall/shoulder.  Patient has an open wound of her lower thoracic region.  Neurological:     Mental Status: She is alert.  Psychiatric:  Mood and Affect: Mood normal.        Behavior: Behavior normal.    UC Treatments / Results  Labs (all labs ordered are listed, but only abnormal results are displayed) Labs Reviewed - No data to display  EKG None  Radiology No results found.  Procedures Procedures (including critical care time)  Medications Ordered in UC Medications - No data to display  Initial Impression / Assessment and Plan / UC Course  I have reviewed the  triage vital signs and the nursing notes.  Pertinent labs & imaging results that were available during my care of the patient were reviewed by me and considered in my medical decision making (see chart for details).    78 year old female with a complicated past medical history presents with multiple abscesses.  Patient is in assisted living facility.  She is at high risk for complications and poor wound healing.  I recommended to the family that she go to the emergency department for likely surgical consultation.  I recommend surgical consultation and IV antibiotics as well as admission to closely monitor for clinical improvement.  Patient going to the ER.  Final Clinical Impressions(s) / UC Diagnoses   Final diagnoses:  Abscess of multiple sites     Discharge Instructions     Take her directly to the ER.  Take care and best of luck.  Dr. Adriana Simasook    ED Prescriptions    None     Controlled Substance Prescriptions [place] Controlled Substance Registry consulted? Not Applicable   Tommie SamsCook, Johneisha Broaden G, DO 07/10/18 [id number]

## 2018-07-10 NOTE — Discharge Instructions (Signed)
Take her directly to the ER.  Take care and best of luck.  Dr. Adriana Simas

## 2018-07-10 NOTE — ED Triage Notes (Signed)
Pt c/o abscesses on her right side of her abdomen and right shoulder and her chest. Pt says she started having symptoms 4 days ago but family was advised today.
